# Patient Record
Sex: Female | Born: 1960 | Race: Black or African American | Hispanic: No | Marital: Single | Smoking: Never smoker
Health system: Southern US, Community
[De-identification: ages and names within clinical notes are randomized; demographics above are authoritative.]

## PROBLEM LIST (undated history)

## (undated) DIAGNOSIS — E663 Overweight: Secondary | ICD-10-CM

## (undated) DIAGNOSIS — K219 Gastro-esophageal reflux disease without esophagitis: Secondary | ICD-10-CM

## (undated) DIAGNOSIS — M25569 Pain in unspecified knee: Secondary | ICD-10-CM

## (undated) DIAGNOSIS — IMO0001 Reserved for inherently not codable concepts without codable children: Secondary | ICD-10-CM

## (undated) DIAGNOSIS — E785 Hyperlipidemia, unspecified: Secondary | ICD-10-CM

## (undated) HISTORY — DX: Overweight: E66.3

## (undated) HISTORY — DX: Hyperlipidemia, unspecified: E78.5

## (undated) HISTORY — DX: Gastro-esophageal reflux disease without esophagitis: K21.9

## (undated) HISTORY — DX: Pain in unspecified knee: M25.569

## (undated) HISTORY — DX: Reserved for inherently not codable concepts without codable children: IMO0001

---

## 1998-04-29 ENCOUNTER — Other Ambulatory Visit: Admission: RE | Admit: 1998-04-29 | Discharge: 1998-04-29 | Payer: Self-pay | Admitting: Obstetrics and Gynecology

## 2001-08-01 ENCOUNTER — Encounter: Admission: RE | Admit: 2001-08-01 | Discharge: 2001-08-01 | Payer: Self-pay | Admitting: Family Medicine

## 2001-08-01 ENCOUNTER — Encounter: Payer: Self-pay | Admitting: Family Medicine

## 2004-05-04 ENCOUNTER — Other Ambulatory Visit: Admission: RE | Admit: 2004-05-04 | Discharge: 2004-05-04 | Payer: Self-pay | Admitting: Family Medicine

## 2005-05-12 ENCOUNTER — Other Ambulatory Visit: Admission: RE | Admit: 2005-05-12 | Discharge: 2005-05-12 | Payer: Self-pay | Admitting: Family Medicine

## 2006-12-22 ENCOUNTER — Other Ambulatory Visit: Admission: RE | Admit: 2006-12-22 | Discharge: 2006-12-22 | Payer: Self-pay | Admitting: Family Medicine

## 2012-07-03 ENCOUNTER — Other Ambulatory Visit: Payer: Self-pay | Admitting: Obstetrics and Gynecology

## 2012-07-03 ENCOUNTER — Other Ambulatory Visit (HOSPITAL_COMMUNITY)
Admission: RE | Admit: 2012-07-03 | Discharge: 2012-07-03 | Disposition: A | Discharge status: Home / Self Care | Payer: BC Managed Care – PPO | Source: Ambulatory Visit | Attending: Obstetrics and Gynecology | Admitting: Obstetrics and Gynecology

## 2012-07-03 DIAGNOSIS — Z01419 Encounter for gynecological examination (general) (routine) without abnormal findings: Secondary | ICD-10-CM | POA: Insufficient documentation

## 2012-07-03 DIAGNOSIS — Z1151 Encounter for screening for human papillomavirus (HPV): Secondary | ICD-10-CM | POA: Insufficient documentation

## 2015-08-05 ENCOUNTER — Ambulatory Visit
Admission: RE | Admit: 2015-08-05 | Discharge: 2015-08-05 | Disposition: A | Discharge status: Home / Self Care | Payer: BC Managed Care – PPO | Source: Ambulatory Visit | Attending: Physician Assistant | Admitting: Physician Assistant

## 2015-08-05 ENCOUNTER — Other Ambulatory Visit: Payer: Self-pay | Admitting: Physician Assistant

## 2015-08-05 DIAGNOSIS — M549 Dorsalgia, unspecified: Secondary | ICD-10-CM

## 2015-11-03 ENCOUNTER — Encounter: Payer: Self-pay | Admitting: Cardiology

## 2015-12-29 ENCOUNTER — Other Ambulatory Visit (HOSPITAL_COMMUNITY)
Admission: RE | Admit: 2015-12-29 | Discharge: 2015-12-29 | Disposition: A | Discharge status: Home / Self Care | Payer: BC Managed Care – PPO | Source: Ambulatory Visit | Attending: Obstetrics and Gynecology | Admitting: Obstetrics and Gynecology

## 2015-12-29 ENCOUNTER — Other Ambulatory Visit: Payer: Self-pay | Admitting: Obstetrics and Gynecology

## 2015-12-29 DIAGNOSIS — Z01419 Encounter for gynecological examination (general) (routine) without abnormal findings: Secondary | ICD-10-CM | POA: Diagnosis present

## 2015-12-29 DIAGNOSIS — Z1151 Encounter for screening for human papillomavirus (HPV): Secondary | ICD-10-CM | POA: Diagnosis present

## 2015-12-31 LAB — CYTOLOGY - PAP

## 2016-12-06 ENCOUNTER — Other Ambulatory Visit: Payer: Self-pay | Admitting: Family Medicine

## 2016-12-06 ENCOUNTER — Ambulatory Visit
Admission: RE | Admit: 2016-12-06 | Discharge: 2016-12-06 | Disposition: A | Discharge status: Home / Self Care | Payer: Worker's Compensation | Source: Ambulatory Visit | Attending: Family Medicine | Admitting: Family Medicine

## 2016-12-06 DIAGNOSIS — W19XXXA Unspecified fall, initial encounter: Secondary | ICD-10-CM

## 2019-01-02 ENCOUNTER — Other Ambulatory Visit: Payer: Self-pay | Admitting: Obstetrics and Gynecology

## 2019-01-02 ENCOUNTER — Other Ambulatory Visit (HOSPITAL_COMMUNITY)
Admission: RE | Admit: 2019-01-02 | Discharge: 2019-01-02 | Disposition: A | Discharge status: Home / Self Care | Payer: BC Managed Care – PPO | Source: Ambulatory Visit | Attending: Obstetrics and Gynecology | Admitting: Obstetrics and Gynecology

## 2019-01-02 DIAGNOSIS — Z01419 Encounter for gynecological examination (general) (routine) without abnormal findings: Secondary | ICD-10-CM | POA: Insufficient documentation

## 2019-01-04 LAB — CYTOLOGY - PAP
Diagnosis: NEGATIVE
HPV: NOT DETECTED

## 2019-07-10 ENCOUNTER — Ambulatory Visit: Payer: BC Managed Care – PPO | Attending: Internal Medicine

## 2019-07-10 DIAGNOSIS — Z20822 Contact with and (suspected) exposure to covid-19: Secondary | ICD-10-CM

## 2019-07-12 LAB — NOVEL CORONAVIRUS, NAA: SARS-CoV-2, NAA: NOT DETECTED

## 2019-12-03 NOTE — Discharge Summary (Signed)
 ------------------------------------------------------------------------------- Attestation signed by Breanna Royden Fallow, MD at 12/03/2019 12:37 PM mc -------------------------------------------------------------------------------  Spine Discharge Summary   Admit date: 11/29/2019  Discharge date: 7.19.21  Discharge to: Home  Discharge Service: Orthopedic Spine  Discharge Attending Physician: Royden Riley Gust, MD  Discharge  Diagnosis: L4-S1 Laminectomy and PSF  Secondary Diagnoses: Active Problems:   Spondylolisthesis of lumbar region   Vitamin B12 deficiency   Iron deficiency anemia   OR Procedures: L4-S1 Laminectomy and PSF   Discharge Day Services:  The patient was seen and evaluated by the Ortho Spine team on the day of discharge and deemed stable for discharge, including stable labs, vital signs, radiographic studies, and neurologic exam.  Discharge instructions were given and explained.  Questions were answered.   Physical Examination:  General Appearance:  alert, appears stated age and cooperative Extremities:   extremities normal, atraumatic, no cyanosis or edema Pulses:   2+ and symmetric Skin:   Skin color, texture, turgor normal. No rashes or lesions Neurologic:   Grossly normal  Hospital Course: PT underwent the above procedure without complications. Patient was transferred to PACU in stable condition and then to [address] [place]. Orthopedic spine protocol was followed. Patient received 1unit PRBC on 12/01/19 d/t Hgb of 7.5 with known IDA. Patient became febrile and postop fever vs transfusion reaction workup was completed by hospital medicine. No acute infectious process or transfusion complications were noted. Pain is well controlled with current medications. Will dc home today after up with PT/OT x1. DME rec per PT/OT.    Condition at Discharge: improved  Discharge Medications:   Ayanni, Tun  Home Medication Instructions [id number]   Printed on:12/03/19 1155   Medication Information                    ascorbic acid, vitamin C, (VITAMIN C) 100 MG tablet Take 1 tablet (100 mg total) by mouth daily.           celecoxib (CELEBREX) 200 MG capsule Take 200 mg by mouth daily. **may begin 1 month post op**          cyanocobalamin (VITAMIN B-12) 1000 MCG tablet Take 1 tablet (1,000 mcg total) by mouth daily for 30 days.                     ergocalciferol, vitamin D2, (VITAMIN D ORAL) Take by mouth daily.           esomeprazole (NEXIUM) 40 MG capsule Take 40 mg by mouth daily before breakfast.              ferrous gluconate (FERGON) 324 MG tablet Take 1 tablet (324 mg total) by mouth 2 times daily with meals.           gabapentin (NEURONTIN) 300 MG capsule Take 300 mg by mouth 3 times daily.                     liraglutide, weight loss, (SAXENDA) 3 mg/0.5 mL (18 mg/3 mL) injection Inject 3 mg into the skin daily.           methocarbamoL (ROBAXIN) 500 MG tablet Take 500 mg by mouth 3 (three) times daily as needed.           PROAIR HFA 90 mcg/actuation inhaler Inhale 1 puff into the lungs every 6 (six) hours as needed.              Oxycodone 10mg -acetaminophen 325mg  Take one tablet every 4-6  hours as needed for pain.             [facility] Discharge Instructions - Lumbar Fusion  BRACE You should wear your brace at all times unless otherwise instructed by your surgeon.  You may wear a T-shirt under your brace so that it isn't in contact with your bare skin.  INCISION Please make sure your incisions are checked at least once daily for signs and symptoms of infection: If any of the below should occur, please call us . Drainage from incision site Opening of incision Increased redness and/or tenderness  Fevers greater than 101F  SHOWERING  You may remove your bandages and shower normally 48 hours after surgery.  Hair washing is permissible while in the shower. No soaking in a tub until seen in the office.     EXERCISE Do NOT lift anything weighing greater than about 5 lbs.  (A gallon container of water weighs about 8.5 lbs) Do not bend or twist at the waist.  Always bend your knees.  Try to avoid picking up objects below counter level. You may walk or climb stairs as tolerated.  Walking frequently after surgery will help prevent blood clots. Sitting for over 20-30 minutes at a time can aggravate the pain.  Most patients find sitting in a recliner to be more comfortable.  You can lie down or walk between sitting periods.    PAIN Take the prescribed pain medications only if you are having pain.  Try to minimize the amount taken. Do NOT take any anti-inflammatory medications (ibuprofen, Advil, Aleve, Motrin, etc.) for the first 3 months following your surgery.  These medications can prevent the spinal fusion from healing. To help alleviate persistent soreness, apply ice.  It is normal for this discomfort to persist for several weeks following your surgery.   DRIVING You may NOT drive a car until cleared by your physician.  If you have to take a long trip as a passenger, make sure to stop every 30 minutes so that you can walk around and stretch your legs.  Reclining the passenger seat can make the trip more comfortable.   FOLLOW-UP APPOINTMENTS 4 weeks after surgery.  IF YOU HAVE QUESTIONS or CONCERNS .   call  [phone]   Camie Pickle, PA-C   Electronically signed by: Camie Dolores Pickle, PA-C 12/03/19 1202    Electronically signed by: Max Elsie Schneider, MD 12/03/19 [phone]

## 2019-12-21 NOTE — Discharge Summary (Signed)
 Spine Discharge Summary   Admit date: 12/18/2019  Discharge date: 12/21/2019  Discharge to: Home with home health services  Discharge Service: Orthopedic Spine  Discharge Attending Physician: Royden Elsie Schneider, MD  Discharge  Diagnosis: s/p I&D of incisional wound   Secondary Diagnoses: Active Problems:   S/P lumbar fusion   Wound infection after surgery   Post-operative pain   OR Procedures: s/p I&D of incisional wound   Discharge Day Services:  The patient was seen and evaluated by the Ortho Spine team on the day of discharge and deemed stable for discharge, including stable labs, vital signs, radiographic studies, and neurologic exam.  Discharge instructions were given and explained.  Questions were answered.   Physical Examination:  General Appearance:  alert, appears stated age and cooperative Extremities:   extremities normal, atraumatic, no cyanosis or edema Pulses:   2+ and symmetric Skin:   sutures intact without discharge or signs of infection Neurologic:   Alert and oriented X 3, normal strength and tone. Normal symmetric reflexes. Normal coordination and gait Sensory: normal Motor:grossly normal  Hospital Course: PT underwent the above procedure without complications. Patient was transferred to PACU in stable condition and then to [address] [place]. Orthopedic spine protocol was followed. Pain is well controlled with current medications. Will dc home today after up with PT/OT x1. DME rec per PT/OT.    Condition at Discharge: improved  Discharge Medications:   Breanna Riley, Breanna Riley  Home Medication Instructions [id number]   Printed on:12/21/19 9072  Medication Information                    ascorbic acid, vitamin C, (VITAMIN C) 100 MG tablet Take 1 tablet (100 mg total) by mouth daily.           ciprofloxacin HCl (CIPRO) 500 MG tablet Take 1 tablet (500 mg total) by mouth 2 times daily for 21 days.           cyanocobalamin (VITAMIN B-12) 1000 MCG tablet Take 1 tablet (1,000  mcg total) by mouth daily for 30 days.           ergocalciferol, vitamin D2, (VITAMIN D ORAL) Take 200 Units by mouth daily.              esomeprazole (NEXIUM) 40 MG capsule Take 40 mg by mouth daily before breakfast.              ferrous gluconate (FERGON) 324 MG tablet Take 1 tablet (324 mg total) by mouth 2 times daily with meals.           gabapentin (NEURONTIN) 300 MG capsule Take 300 mg by mouth 3 times daily.           HYDROcodone-acetaminophen (NORCO 10-325) 10-325 mg per tablet Take 1 tablet by mouth every 4 (four) hours as needed for up to 7 days.           methocarbamoL (ROBAXIN) 500 MG tablet Take 500 mg by mouth 3 (three) times daily as needed.            Discharge Instructions - Irrigation and debridement of incisional wound  BRACE You should wear your brace at all times unless otherwise instructed by your surgeon.  You may wear a T-shirt under your brace so that it isn't in contact with your bare skin.  INCISION Change dressing over the incision daily or when bandages are saturated. Recommend using ABD pads and paper tape for bandaging.  Please make sure your incisions  are checked at least once daily for signs and symptoms of infection: If any of the below should occur, please call us . Drainage from incision site Opening of incision Increased redness and/or tenderness  Fevers greater than 101F  SHOWERING You may remove the bandages when showering. Allowing unscented antibacterial soup to run over the incision.  Hair washing is permissible while in the shower. No soaking in a tub until seen in the office.    EXERCISE Do NOT lift anything weighing greater than about 5 lbs.  (A gallon container of water weighs about 8.5 lbs) Do not bend or twist at the waist.  Always bend your knees.  Try to avoid picking up objects below counter level. You may walk or climb stairs as tolerated.  Walking frequently after surgery will help prevent blood clots. Sitting for over  20-30 minutes at a time can aggravate the pain.  Most patients find sitting in a recliner to be more comfortable.  You can lie down or walk between sitting periods.    PAIN Take the prescribed pain medications only if you are having pain.  Try to minimize the amount taken. To help alleviate persistent soreness, apply ice or warm moist compresses.  It is normal for this discomfort to persist for several weeks following your surgery.   DRIVING You may NOT drive a car until cleared by your physician.  If you have to take a long trip as a passenger, make sure to stop every 30 minutes so that you can walk around and stretch your legs.  Reclining the passenger seat can make the trip more comfortable.   FOLLOW-UP APPOINTMENTS  1 week for incision check .  IF YOU HAVE QUESTIONS or CONCERNS .  Call  [phone]   Isaiah Anton, PA-C    Electronically signed by: Isaiah Rumalda Anton, PA-C 12/21/19 0930

## 2020-07-31 ENCOUNTER — Other Ambulatory Visit: Payer: Self-pay | Admitting: Surgery

## 2020-07-31 DIAGNOSIS — M4326 Fusion of spine, lumbar region: Secondary | ICD-10-CM

## 2020-07-31 NOTE — Progress Notes (Signed)
Phone call to patient to verify medication list and allergies for myelogram procedure. Pt instructed to hold Tramadol for 48hrs prior to myelogram appointment time and 24 hours after appointment. Pt also instructed to have a driver the day of the procedure, the procedure would take around 2 hours, and discharge instructions discussed. Pt verbalized understanding.  

## 2020-08-04 ENCOUNTER — Other Ambulatory Visit: Payer: Self-pay

## 2020-08-04 ENCOUNTER — Ambulatory Visit
Admission: RE | Admit: 2020-08-04 | Discharge: 2020-08-04 | Disposition: A | Discharge status: Home / Self Care | Payer: Worker's Compensation | Source: Ambulatory Visit | Attending: Surgery | Admitting: Surgery

## 2020-08-04 DIAGNOSIS — M4326 Fusion of spine, lumbar region: Secondary | ICD-10-CM

## 2020-08-04 MED ORDER — DIAZEPAM 5 MG PO TABS
10.0000 mg | ORAL_TABLET | Freq: Once | ORAL | Status: AC
  Administered 2020-08-04: 10 mg via ORAL

## 2020-08-04 MED ORDER — IOPAMIDOL (ISOVUE-M 200) INJECTION 41%
20.0000 mL | Freq: Once | INTRAMUSCULAR | Status: AC
  Administered 2020-08-04: 20 mL via INTRATHECAL

## 2020-08-04 MED ORDER — MEPERIDINE HCL 50 MG/ML IJ SOLN
50.0000 mg | Freq: Once | INTRAMUSCULAR | Status: AC
  Administered 2020-08-04: 50 mg via INTRAMUSCULAR

## 2020-08-04 MED ORDER — ONDANSETRON HCL 4 MG/2ML IJ SOLN
4.0000 mg | Freq: Once | INTRAMUSCULAR | Status: AC
  Administered 2020-08-04: 4 mg via INTRAMUSCULAR

## 2020-08-04 NOTE — Progress Notes (Signed)
Pt reports she has been off of tramadol for 48 hours and verbalizes understanding not to restart this medication until tomorrow on 08/05/20.

## 2020-08-04 NOTE — Discharge Instr - Other Orders (Addendum)
1020 lower back pain reportedly 10/10  *See MAR 1050 eating crackers and drinking soda without complaints of back pain.

## 2020-08-04 NOTE — Progress Notes (Signed)
Phone call to patient on 07/31/20 to verify medication list and allergies for myelogram procedure. Pt instructed to hold tramadol   for 48hrs prior to myelogram appointment time and 24 hours after appointment. Pt also instructed to have a driver the day of the procedure, the procedure would take around 2 hours, and discharge instructions discussed. Pt verbalized understanding.

## 2020-08-04 NOTE — Discharge Instructions (Signed)
Myelogram Discharge Instructions  1. Go home and rest quietly for the next 24 hours.  It is important to lie flat for the next 24 hours.  Get up only to go to the restroom.  You may lie in the bed or on a couch on your back, your stomach, your left side or your right side.  You may have one pillow under your head.  You may have pillows between your knees while you are on your side or under your knees while you are on your back.  2. DO NOT drive today.  Recline the seat as far back as it will go, while still wearing your seat belt, on the way home.  3. You may get up to go to the bathroom as needed.  You may sit up for 10 minutes to eat.  You may resume your normal diet and medications unless otherwise indicated.  Drink lots of extra fluids today and tomorrow.  4. The incidence of headache, nausea, or vomiting is about 5% (one in 20 patients).  If you develop a headache, lie flat and drink plenty of fluids until the headache goes away.  Caffeinated beverages may be helpful.  If you develop severe nausea and vomiting or a headache that does not go away with flat bed rest, call [phone].  5. You may resume normal activities after your 24 hours of bed rest is over; however, do not exert yourself strongly or do any heavy lifting tomorrow. If when you get up you have a headache when standing, go back to bed and force fluids for another 24 hours.  6. Call your physician for a follow-up appointment.  The results of your myelogram will be sent directly to your physician by the following day.  7. If you have any questions or if complications develop after you arrive home, please call [phone].  Discharge instructions have been explained to the patient.  The patient, or the person responsible for the patient, fully understands these instructions   YOU MAY TAKE YOUR TRAMADOL TOMORROW ON 08/05/20 @ 9:30AM OR THEREAFTER.

## 2022-08-19 IMAGING — XA DG MYELOGRAPHY LUMBAR INJ LUMBOSACRAL
13 of 18 series · 13 of 18 positions shown · non-contrast
Comparison: Lumbar spine x-ray 12/01/2019

CLINICAL DATA: Low back pain extending into the lower extremities
bilaterally, left greater than right. Patient had an official relief
of pain following surgery. Symptoms are now recurrent.
TECHNIQUE: Contiguous axial images were obtained through the Lumbar spine after
the intrathecal infusion of infusion. Coronal and sagittal
reconstructions were obtained of the axial image sets.

[Series 1: w lumbar spine lat · 0.15mm/px · 1 of 1 slices shown]
[im 1/1]
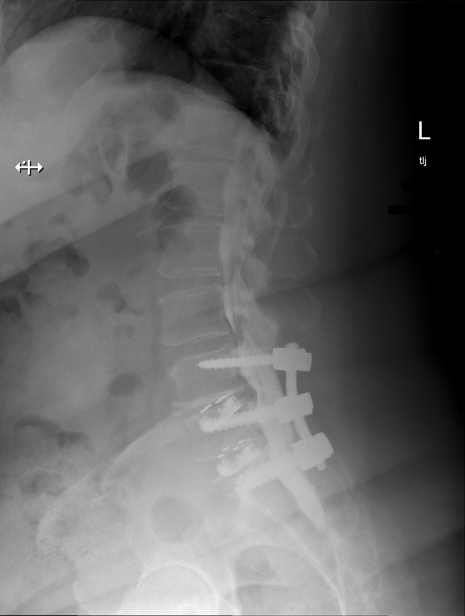

[Series 2: vasc adipose · 1 of 1 slices shown (1 of 10)]
[im 1/1]
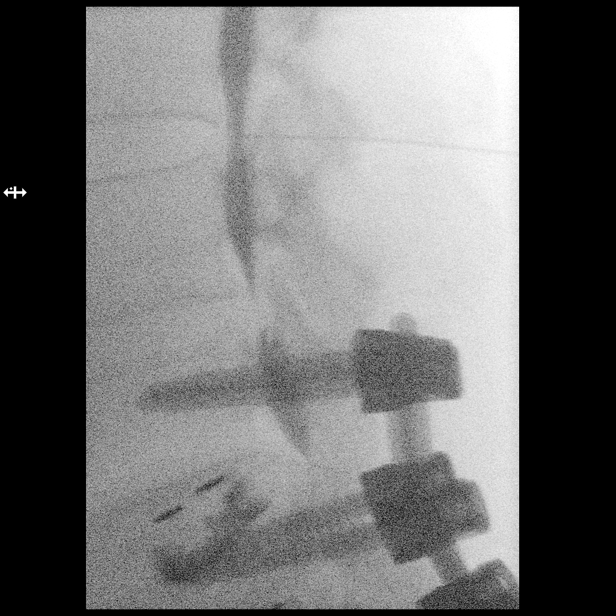

[Series 2: w lumbar spine flexion · 0.15mm/px · 1 of 1 slices shown]
[im 1/1]
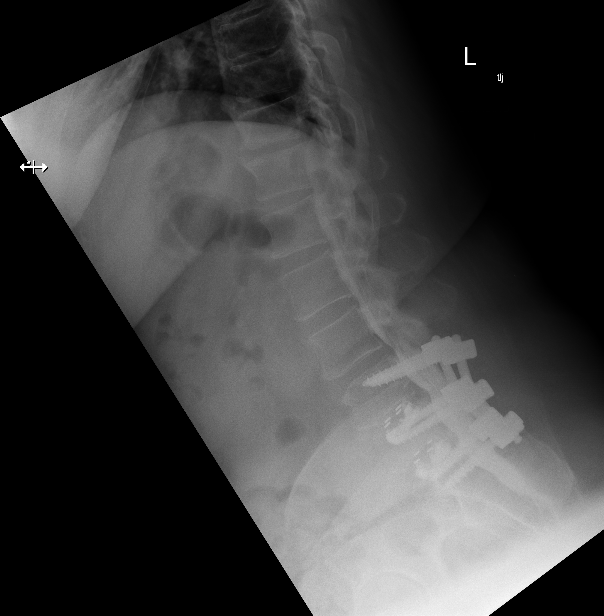

[Series 3: w lumbar spine extension · 0.15mm/px · 1 of 1 slices shown]
[im 1/1]
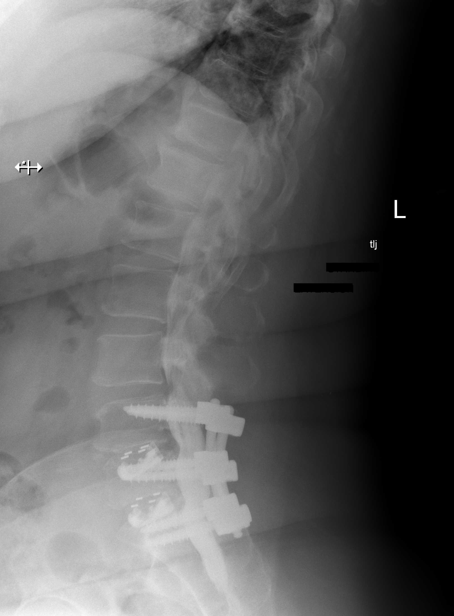

[Series 4: vasc adipose · 1 of 1 slices shown (2 of 10)]
[im 1/1]
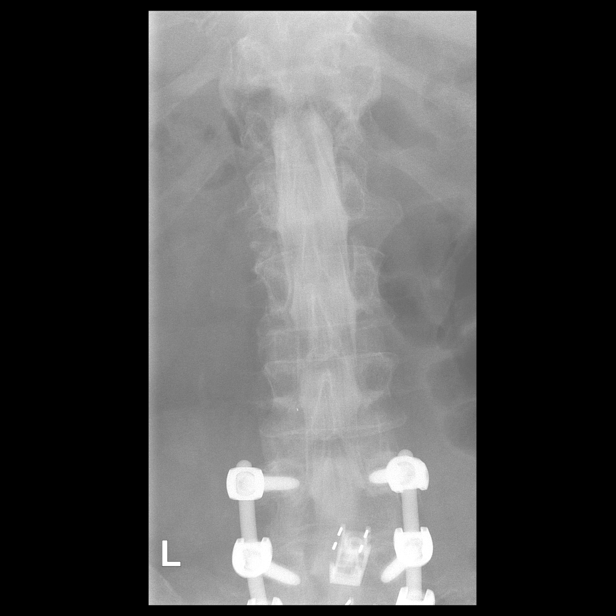

[Series 5: vasc adipose · 1 of 1 slices shown (3 of 10)]
[im 1/1]
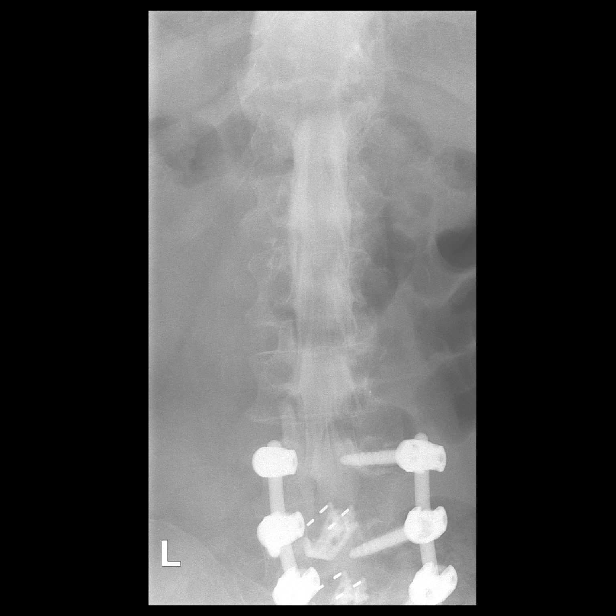

[Series 7: vasc adipose · 1 of 1 slices shown (4 of 10)]
[im 1/1]
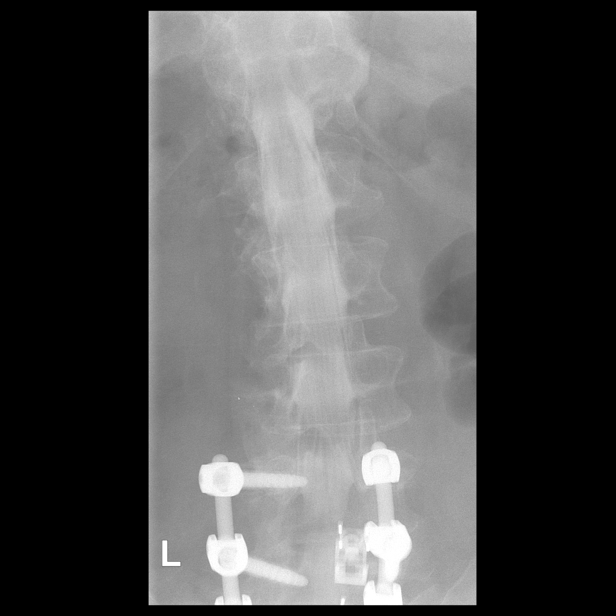

[Series 8: vasc adipose · 1 of 1 slices shown (5 of 10)]
[im 1/1]
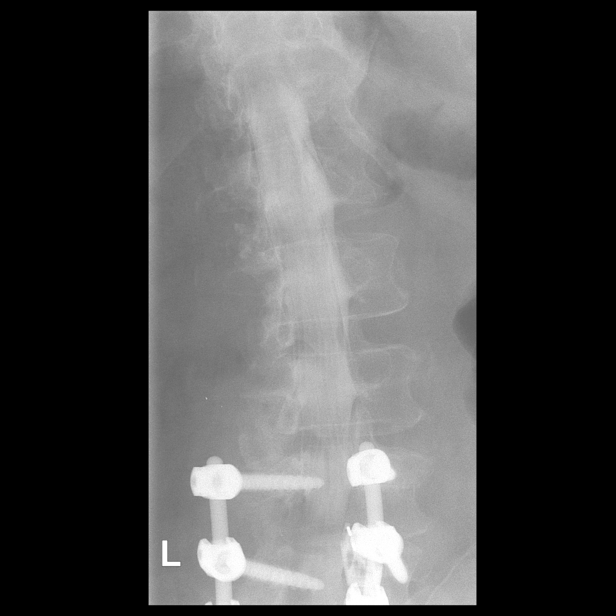

[Series 9: vasc adipose · 1 of 1 slices shown (6 of 10)]
[im 1/1]
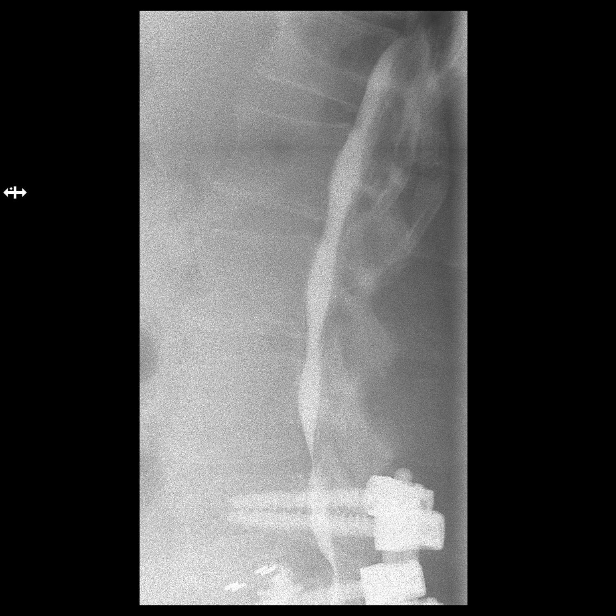

[Series 11: vasc adipose · 1 of 1 slices shown (7 of 10)]
[im 1/1]
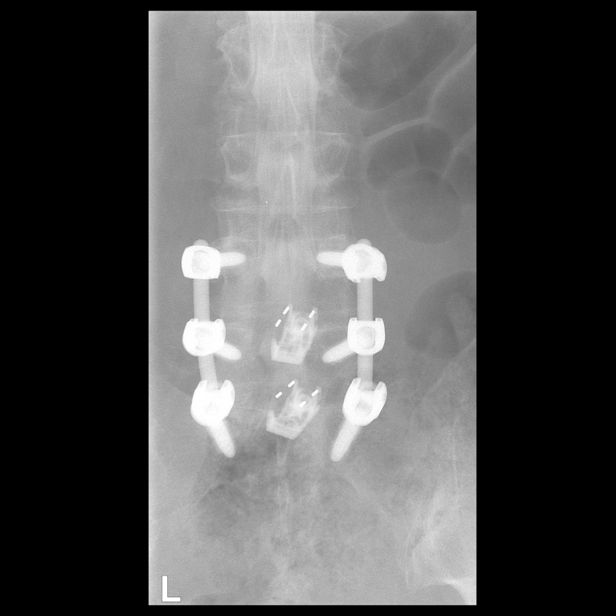

[Series 12: vasc adipose · 1 of 1 slices shown (8 of 10)]
[im 1/1]
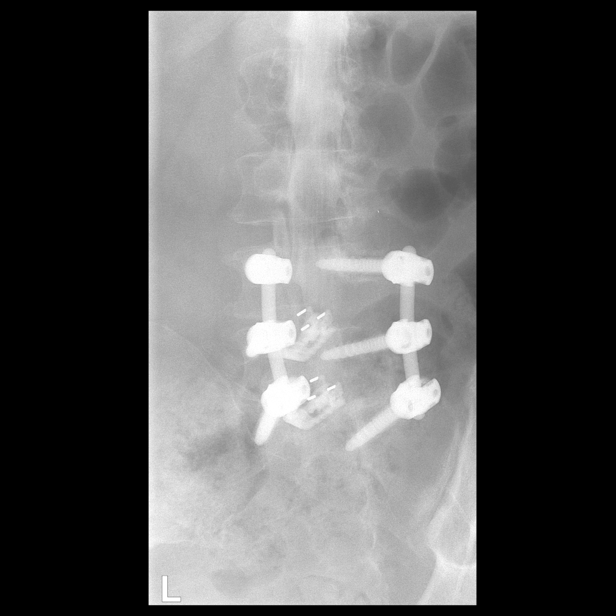

[Series 13: vasc adipose · 1 of 1 slices shown (9 of 10)]
[im 1/1]
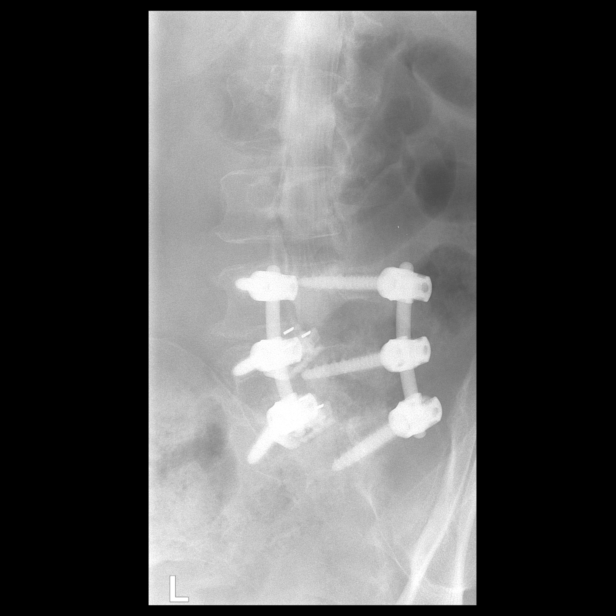

[Series 15: vasc adipose · 1 of 1 slices shown (10 of 10)]
[im 1/1]
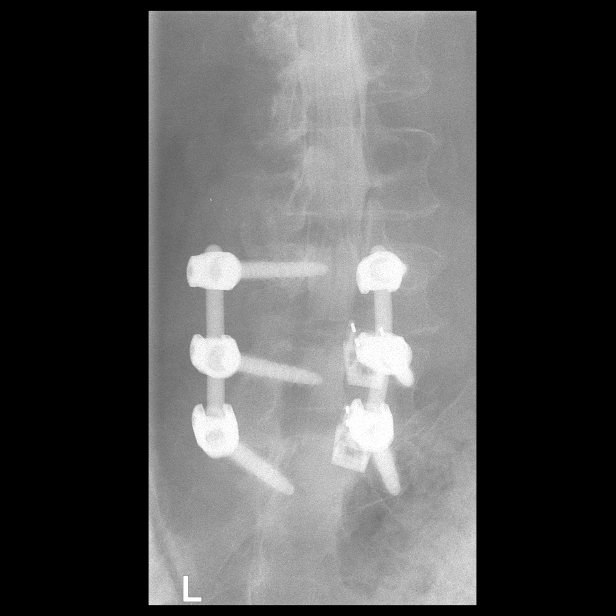

[13 of 18 positions shown; findings below may reference images not displayed]

EXAM:
LUMBAR MYELOGRAM

FLUOROSCOPY TIME:  Radiation Exposure Index (as provided by the
fluoroscopic device): 374.28 uGy*m2

PROCEDURE:
After thorough discussion of risks and benefits of the procedure
including bleeding, infection, injury to nerves, blood vessels,
adjacent structures as well as headache and CSF leak, written and
oral informed consent was obtained. Consent was obtained by Dr.
Botagoz Zh. Time out form was completed.

Patient was positioned prone on the fluoroscopy table. Local
anesthesia was provided with 1% lidocaine without epinephrine after
prepped and draped in the usual sterile fashion. Puncture was
performed at L2-3 using a 3 1/2 inch 22-gauge spinal needle via
right paramedian approach. Using a single pass through the dura, the
needle was placed within the thecal sac, with return of clear CSF.
15 mL of Isovue P-YFF was injected into the thecal sac, with normal
opacification of the nerve roots and cauda equina consistent with
free flow within the subarachnoid space.

I personally performed the lumbar puncture and administered the
intrathecal contrast. I also personally supervised acquisition of
the myelogram images.
FINDINGS: LUMBAR MYELOGRAM FINDINGS:

Five non rib-bearing lumbar type vertebral bodies are present.
Discectomy and fusion noted at L3-4 and L4-5. Grade 1
anterolisthesis is again noted at L3-4. This is exaggerated with
flexion and reduced in extension.

Adjacent level disease is present at L3-4 with a broad-based disc
protrusion. The disc protrusion does not change significantly with
standing. Mild subarticular narrowing is present bilaterally at
L3-4. Residual central canal narrowing is present at L4-5. Lumbar
nerve roots fill normally on both sides.

CT LUMBAR MYELOGRAM FINDINGS:

Non rib-bearing lumbar type vertebral bodies are present no
significant listhesis is present in the supine position. Conus
medullaris terminates at L2, normal. No significant listhesis is
present. Visualized abdominal structures are unremarkable. No
significant adenopathy is present. Paraspinous musculature is within
normal limits. Edematous changes are as expected following surgery.

L1-2: Negative.

L2-3: Facet hypertrophy is present. No significant disc protrusion
or stenosis is present.

L3-4: Mild broad-based disc protrusion is present. Disc extends into
the foramina bilaterally. Moderate facet hypertrophy and ligamentum
flavum thickening is noted. This results in moderate central and
bilateral foraminal narrowing.

L4-5: The patient is status post wide laminectomy. Solid fusion
noted. Is slight ventral impression on the thecal sac without
significant stenosis.

L5-S1: Laminectomy and right foraminotomy noted. Solid fusion
present. No residual or recurrent stenosis is present.
IMPRESSION: 1. Discectomy and fusion at L4-5 and L5-S1 with residual or
recurrent stenosis.
2. Adjacent level disease at L3-4 with moderate central and
bilateral foraminal stenosis secondary to a broad-based disc
protrusion and facet hypertrophy.
3. The disc protrusion central stenosis at L3-4 is worse with
standing. It is exaggerated with flexion and reduced in extension.

## 2022-08-19 IMAGING — CT CT L SPINE W/ CM
1 of 6 series · 6 of 14 positions shown, 8 images · non-contrast
Comparison: Lumbar spine x-ray 12/01/2019

CLINICAL DATA: Low back pain extending into the lower extremities
bilaterally, left greater than right. Patient had an official relief
of pain following surgery. Symptoms are now recurrent.
TECHNIQUE: Contiguous axial images were obtained through the Lumbar spine after
the intrathecal infusion of infusion. Coronal and sagittal
reconstructions were obtained of the axial image sets.

[Series 3: l spine soft (person_name) · axial · 0.32mm/px · z∈[-312,-140]mm · 6 of 122 slices shown, 8 images]
[im 18/122  soft-tissue]
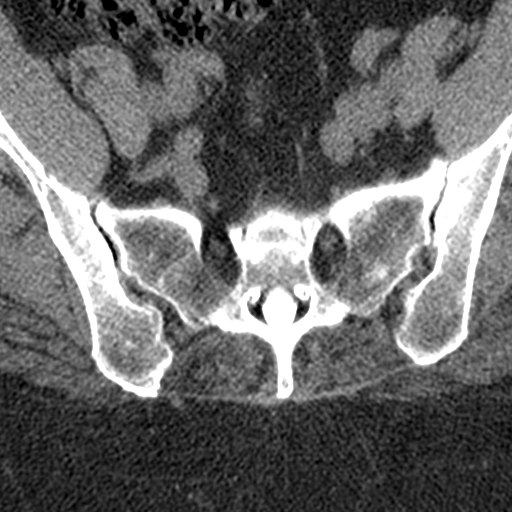
[im 18/122  bone]
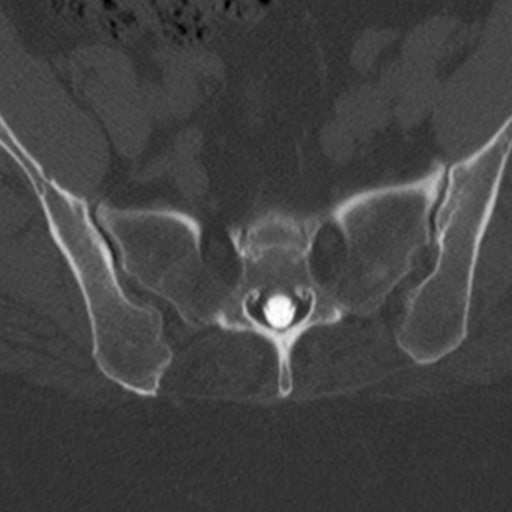
[im 35/122  bone]
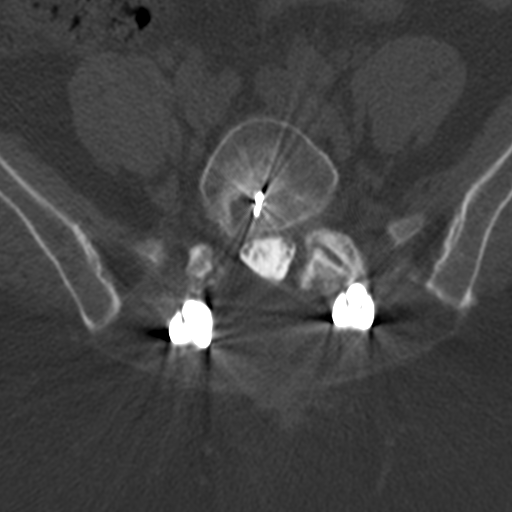
[im 52/122  bone]
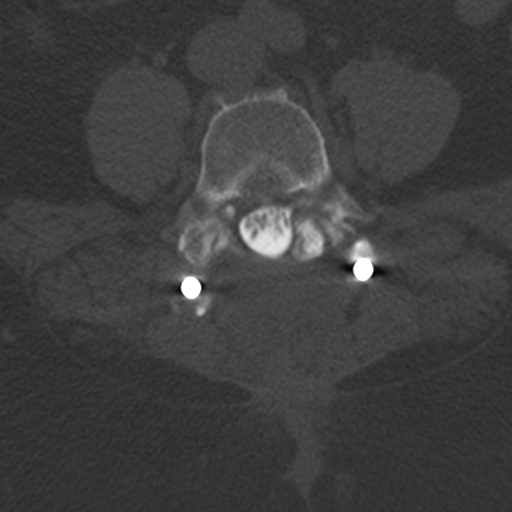
[im 70/122  bone]
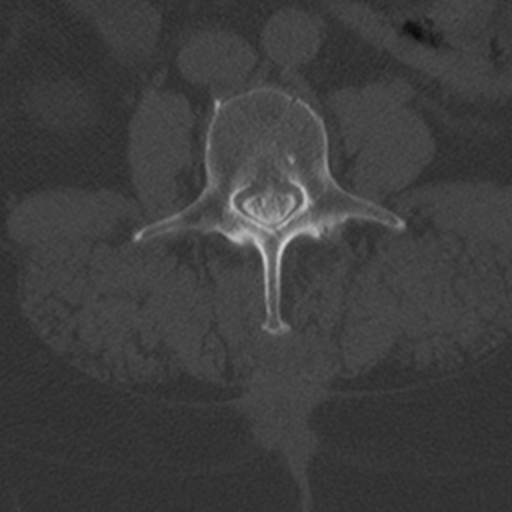
[im 87/122  soft-tissue]
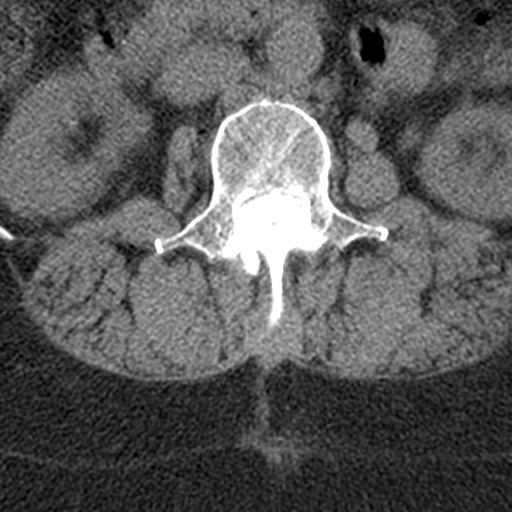
[im 87/122  bone]
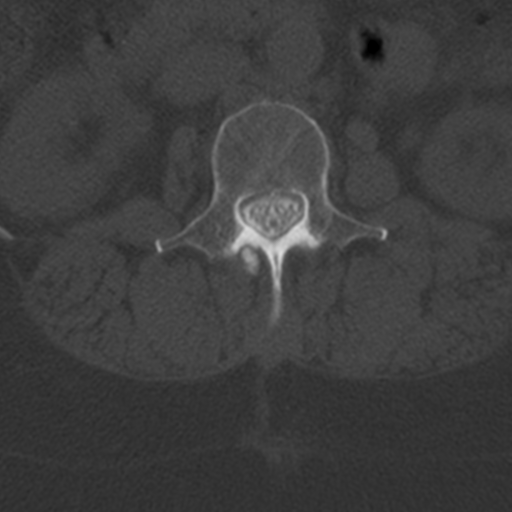
[im 104/122  bone]
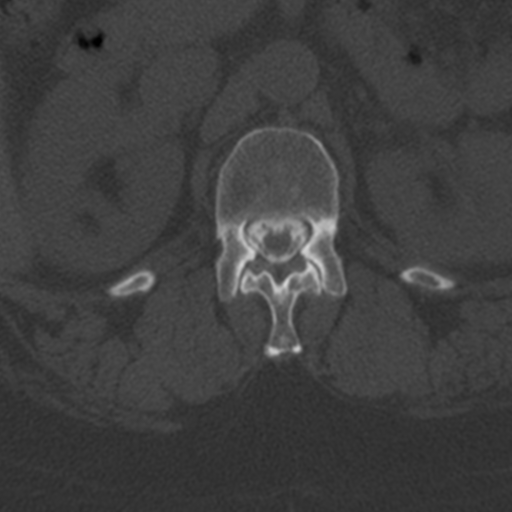

[6 of 14 positions shown; findings below may reference images not displayed]

EXAM:
LUMBAR MYELOGRAM

FLUOROSCOPY TIME:  Radiation Exposure Index (as provided by the
fluoroscopic device): 374.28 uGy*m2

PROCEDURE:
After thorough discussion of risks and benefits of the procedure
including bleeding, infection, injury to nerves, blood vessels,
adjacent structures as well as headache and CSF leak, written and
oral informed consent was obtained. Consent was obtained by Dr.
Botagoz Zh. Time out form was completed.

Patient was positioned prone on the fluoroscopy table. Local
anesthesia was provided with 1% lidocaine without epinephrine after
prepped and draped in the usual sterile fashion. Puncture was
performed at L2-3 using a 3 1/2 inch 22-gauge spinal needle via
right paramedian approach. Using a single pass through the dura, the
needle was placed within the thecal sac, with return of clear CSF.
15 mL of Isovue P-YFF was injected into the thecal sac, with normal
opacification of the nerve roots and cauda equina consistent with
free flow within the subarachnoid space.

I personally performed the lumbar puncture and administered the
intrathecal contrast. I also personally supervised acquisition of
the myelogram images.
FINDINGS: LUMBAR MYELOGRAM FINDINGS:

Five non rib-bearing lumbar type vertebral bodies are present.
Discectomy and fusion noted at L3-4 and L4-5. Grade 1
anterolisthesis is again noted at L3-4. This is exaggerated with
flexion and reduced in extension.

Adjacent level disease is present at L3-4 with a broad-based disc
protrusion. The disc protrusion does not change significantly with
standing. Mild subarticular narrowing is present bilaterally at
L3-4. Residual central canal narrowing is present at L4-5. Lumbar
nerve roots fill normally on both sides.

CT LUMBAR MYELOGRAM FINDINGS:

Non rib-bearing lumbar type vertebral bodies are present no
significant listhesis is present in the supine position. Conus
medullaris terminates at L2, normal. No significant listhesis is
present. Visualized abdominal structures are unremarkable. No
significant adenopathy is present. Paraspinous musculature is within
normal limits. Edematous changes are as expected following surgery.

L1-2: Negative.

L2-3: Facet hypertrophy is present. No significant disc protrusion
or stenosis is present.

L3-4: Mild broad-based disc protrusion is present. Disc extends into
the foramina bilaterally. Moderate facet hypertrophy and ligamentum
flavum thickening is noted. This results in moderate central and
bilateral foraminal narrowing.

L4-5: The patient is status post wide laminectomy. Solid fusion
noted. Is slight ventral impression on the thecal sac without
significant stenosis.

L5-S1: Laminectomy and right foraminotomy noted. Solid fusion
present. No residual or recurrent stenosis is present.
IMPRESSION: 1. Discectomy and fusion at L4-5 and L5-S1 with residual or
recurrent stenosis.
2. Adjacent level disease at L3-4 with moderate central and
bilateral foraminal stenosis secondary to a broad-based disc
protrusion and facet hypertrophy.
3. The disc protrusion central stenosis at L3-4 is worse with
standing. It is exaggerated with flexion and reduced in extension.

## 2022-10-21 ENCOUNTER — Other Ambulatory Visit: Payer: Self-pay

## 2022-10-21 DIAGNOSIS — M479 Spondylosis, unspecified: Secondary | ICD-10-CM

## 2022-10-21 DIAGNOSIS — M5116 Intervertebral disc disorders with radiculopathy, lumbar region: Secondary | ICD-10-CM

## 2022-10-21 DIAGNOSIS — M4716 Other spondylosis with myelopathy, lumbar region: Secondary | ICD-10-CM

## 2022-10-22 ENCOUNTER — Encounter: Payer: Self-pay | Admitting: Orthopaedic Surgery

## 2022-10-22 ENCOUNTER — Other Ambulatory Visit: Payer: Self-pay | Admitting: *Deleted

## 2022-10-22 DIAGNOSIS — M479 Spondylosis, unspecified: Secondary | ICD-10-CM

## 2022-10-22 DIAGNOSIS — M5116 Intervertebral disc disorders with radiculopathy, lumbar region: Secondary | ICD-10-CM

## 2022-10-22 DIAGNOSIS — M4716 Other spondylosis with myelopathy, lumbar region: Secondary | ICD-10-CM

## 2022-10-25 ENCOUNTER — Ambulatory Visit
Admission: RE | Admit: 2022-10-25 | Discharge: 2022-10-25 | Disposition: A | Discharge status: Home / Self Care | Payer: BC Managed Care – PPO | Source: Ambulatory Visit | Attending: *Deleted | Admitting: *Deleted

## 2022-10-25 DIAGNOSIS — M4716 Other spondylosis with myelopathy, lumbar region: Secondary | ICD-10-CM

## 2022-10-25 DIAGNOSIS — M5116 Intervertebral disc disorders with radiculopathy, lumbar region: Secondary | ICD-10-CM

## 2022-10-25 DIAGNOSIS — M479 Spondylosis, unspecified: Secondary | ICD-10-CM

## 2022-10-25 MED ORDER — GADOPICLENOL 0.5 MMOL/ML IV SOLN
10.0000 mL | Freq: Once | INTRAVENOUS | Status: AC | PRN
  Administered 2022-10-25: 10 mL via INTRAVENOUS

## 2022-12-22 NOTE — Discharge Summary (Signed)
 Spine Discharge Summary   Admit date: 12/21/2022  Discharge date: 12/22/2022  Discharge to: Home  Discharge Service: Orthopedic Spine  Discharge Attending Physician: Royden Elsie Schneider, MD  Discharge  Diagnosis: Procedure(s): L3-4 OLIF,  instrumentation, allograft  Secondary Diagnoses: Principal Problem:   Lumbar stenosis with neurogenic claudication Active Problems:   Neurogenic claudication due to lumbar spinal stenosis   OR Procedures: Procedure(s): L3-4 OLIF,  instrumentation, allograft  Discharge Day Services:  The patient was seen and evaluated by the Ortho Spine team on the day of discharge and deemed stable for discharge, including stable labs, vital signs, radiographic studies, and neurologic exam.  Discharge instructions were given and explained.  Questions were answered.  Physical Examination:  Spine Musculoskeletal Exam Motor and neurovascularly intact. Strength 5/5. Negative Homan's Incision clean and healing without significant drainage.  Hospital Course: PT underwent the above procedure without complications. Patient was transferred to PACU in stable condition and then to [facility]. Orthopedic spine protocol was followed. Pain is well controlled with current medications. Will dc home today after up with PT/OT x1. DME rec per PT/OT.    Condition at Discharge: Improved   Discharge Medications:    Medication List     START taking these medications    HYDROcodone-acetaminophen 5-325 mg per tablet Commonly known as: NORCO Take 1 tablet by mouth every 4 (four) hours as needed for severe pain (7-10) for up to 7 days.       CHANGE how you take these medications    meloxicam 15 mg tablet Commonly known as: MOBIC Take 1 tablet (15 mg total) by mouth daily. On hold 7/14 for surgery 12/21/22 per patient Start taking on: March 23, 2023 What changed: These instructions start on March 23, 2023. If you are unsure what to do until then, ask your doctor or other care  provider.       CONTINUE taking these medications    albuterol HFA 90 mcg/actuation inhaler Commonly known as: PROVENTIL HFA;VENTOLIN HFA;PROAIR HFA Inhale 1 puff every 6 (six) hours as needed.   atorvastatin 20 mg tablet Commonly known as: LIPITOR Take 20 mg by mouth daily. Not currently taking.  Starts 12/23/22   cholecalciferol 10 mcg (400 unit) tablet Commonly known as: VITAMIN D3 Take 2,000 Units by mouth daily. On hold 8/2 for surgery 12/21/22   cyanocobalamin 1,000 mcg tablet Commonly known as: VITAMIN B12 Take 1,000 mcg by mouth Once Daily for 30 days.   cyclobenzaprine 10 mg tablet Commonly known as: FLEXERIL Take 10 mg by mouth nightly as needed.   esomeprazole 40 mg DR capsule Commonly known as: NexIUM Take 40 mg by mouth daily before breakfast.   ferrous gluconate 324 mg (38 mg iron) Tab tablet Take 324 mg by mouth 2 (two) times a day with meals for 60 days.   methocarbamoL 500 mg tablet Commonly known as: ROBAXIN Take 500 mg by mouth 3 (three) times a day as needed.   nystatin-triamcinolone 100,000-0.1 unit/g-% cream Commonly known as: MYCOLOG II Apply 1 Application topically daily as needed.   polyethylene glycol 17 gram packet Commonly known as: GLYCOLAX Take 17 g by mouth daily as needed for constipation.   pregabalin 200 mg capsule Commonly known as: LYRICA Take 150 mg by mouth daily as needed.   sertraline 50 mg tablet Commonly known as: ZOLOFT Take 1 tablet by mouth daily.   triamcinolone acetonide 0.5 % cream Commonly known as: KENALOG 1 APPLICATION EXTERNALLY TWICE A DAY 14 DAYS   Vitamin C  100 mg tablet Generic drug: ascorbic acid Take 100 mg by mouth Once Daily.       STOP taking these medications    traMADoL 50 mg tablet Commonly known as: ULTRAM         Where to Get Your Medications     These medications were sent to [id number] - [place], [place] - [address] - PHONE: [phone] - FAX: [phone]  [address] [place] [address]    Phone: [phone]  meloxicam 15 mg tablet    Information about where to get these medications is not yet available   Ask your nurse or doctor about these medications HYDROcodone-acetaminophen 5-325 mg per tablet     [facility] Discharge Instructions - Lumbar Fusion  BRACE You should wear your brace at all times unless otherwise instructed by your surgeon.  You may wear a T-shirt under your brace so that it isn't in contact with your bare skin.  INCISION Please make sure your incisions are checked at least once daily for signs and symptoms of infection: If any of the below should occur, please call us . Drainage from incision site Opening of incision Increased redness and/or tenderness  Fevers greater than 101F  SHOWERING  You may remove your bandages and shower normally 48 hours after surgery.  Hair washing is permissible while in the shower. No soaking in a tub until seen in the office.    EXERCISE Do NOT lift anything weighing greater than about 5 lbs.  (A gallon container of water weighs about 8.5 lbs) Do not bend or twist at the waist.  Always bend your knees.  Try to avoid picking up objects below counter level. You may walk or climb stairs as tolerated.  Walking frequently after surgery will help prevent blood clots. Sitting for over 20-30 minutes at a time can aggravate the pain.  Most patients find sitting in a recliner to be more comfortable.  You can lie down or walk between sitting periods.    PAIN Take the prescribed pain medications only if you are having pain.  Try to minimize the amount taken. Do NOT take any anti-inflammatory medications (ibuprofen, Advil, Aleve, Motrin, etc.) for the first 3 months following your surgery.  These medications can prevent the spinal fusion from healing. To help alleviate persistent soreness, apply ice.  It is normal for this discomfort to persist for several weeks following  your surgery.   DRIVING You may NOT drive a car until cleared by your physician.  If you have to take a long trip as a passenger, make sure to stop every 30 minutes so that you can walk around and stretch your legs.  Reclining the passenger seat can make the trip more comfortable.   FOLLOW-UP APPOINTMENTS 4 weeks after surgery.  IF YOU HAVE QUESTIONS or CONCERNS .   call  [phone]    Katelyn Moffo PA-C

## 2023-05-23 DIAGNOSIS — F4321 Adjustment disorder with depressed mood: Secondary | ICD-10-CM | POA: Diagnosis not present

## 2023-05-26 DIAGNOSIS — M4326 Fusion of spine, lumbar region: Secondary | ICD-10-CM | POA: Diagnosis not present

## 2023-05-26 DIAGNOSIS — M4316 Spondylolisthesis, lumbar region: Secondary | ICD-10-CM | POA: Diagnosis not present

## 2023-05-30 DIAGNOSIS — R2689 Other abnormalities of gait and mobility: Secondary | ICD-10-CM | POA: Diagnosis not present

## 2023-05-30 DIAGNOSIS — M79606 Pain in leg, unspecified: Secondary | ICD-10-CM | POA: Diagnosis not present

## 2023-05-30 DIAGNOSIS — M5459 Other low back pain: Secondary | ICD-10-CM | POA: Diagnosis not present

## 2023-05-30 DIAGNOSIS — Z4789 Encounter for other orthopedic aftercare: Secondary | ICD-10-CM | POA: Diagnosis not present

## 2023-05-30 DIAGNOSIS — M6281 Muscle weakness (generalized): Secondary | ICD-10-CM | POA: Diagnosis not present

## 2023-06-01 DIAGNOSIS — M5459 Other low back pain: Secondary | ICD-10-CM | POA: Diagnosis not present

## 2023-06-01 DIAGNOSIS — Z4789 Encounter for other orthopedic aftercare: Secondary | ICD-10-CM | POA: Diagnosis not present

## 2023-06-01 DIAGNOSIS — M6281 Muscle weakness (generalized): Secondary | ICD-10-CM | POA: Diagnosis not present

## 2023-06-01 DIAGNOSIS — M79606 Pain in leg, unspecified: Secondary | ICD-10-CM | POA: Diagnosis not present

## 2023-06-01 DIAGNOSIS — R2689 Other abnormalities of gait and mobility: Secondary | ICD-10-CM | POA: Diagnosis not present

## 2023-06-06 DIAGNOSIS — Z4789 Encounter for other orthopedic aftercare: Secondary | ICD-10-CM | POA: Diagnosis not present

## 2023-06-06 DIAGNOSIS — M5459 Other low back pain: Secondary | ICD-10-CM | POA: Diagnosis not present

## 2023-06-06 DIAGNOSIS — M6281 Muscle weakness (generalized): Secondary | ICD-10-CM | POA: Diagnosis not present

## 2023-06-06 DIAGNOSIS — R2689 Other abnormalities of gait and mobility: Secondary | ICD-10-CM | POA: Diagnosis not present

## 2023-06-06 DIAGNOSIS — M79606 Pain in leg, unspecified: Secondary | ICD-10-CM | POA: Diagnosis not present

## 2023-06-08 DIAGNOSIS — M6281 Muscle weakness (generalized): Secondary | ICD-10-CM | POA: Diagnosis not present

## 2023-06-08 DIAGNOSIS — F4321 Adjustment disorder with depressed mood: Secondary | ICD-10-CM | POA: Diagnosis not present

## 2023-06-08 DIAGNOSIS — M79606 Pain in leg, unspecified: Secondary | ICD-10-CM | POA: Diagnosis not present

## 2023-06-08 DIAGNOSIS — Z4789 Encounter for other orthopedic aftercare: Secondary | ICD-10-CM | POA: Diagnosis not present

## 2023-06-08 DIAGNOSIS — R2689 Other abnormalities of gait and mobility: Secondary | ICD-10-CM | POA: Diagnosis not present

## 2023-06-08 DIAGNOSIS — M5459 Other low back pain: Secondary | ICD-10-CM | POA: Diagnosis not present

## 2023-06-14 DIAGNOSIS — M170 Bilateral primary osteoarthritis of knee: Secondary | ICD-10-CM | POA: Diagnosis not present

## 2023-06-17 DIAGNOSIS — M6281 Muscle weakness (generalized): Secondary | ICD-10-CM | POA: Diagnosis not present

## 2023-06-17 DIAGNOSIS — Z4789 Encounter for other orthopedic aftercare: Secondary | ICD-10-CM | POA: Diagnosis not present

## 2023-06-17 DIAGNOSIS — R2689 Other abnormalities of gait and mobility: Secondary | ICD-10-CM | POA: Diagnosis not present

## 2023-06-17 DIAGNOSIS — M5459 Other low back pain: Secondary | ICD-10-CM | POA: Diagnosis not present

## 2023-06-17 DIAGNOSIS — M79606 Pain in leg, unspecified: Secondary | ICD-10-CM | POA: Diagnosis not present

## 2023-06-20 DIAGNOSIS — M6281 Muscle weakness (generalized): Secondary | ICD-10-CM | POA: Diagnosis not present

## 2023-06-20 DIAGNOSIS — M79606 Pain in leg, unspecified: Secondary | ICD-10-CM | POA: Diagnosis not present

## 2023-06-20 DIAGNOSIS — R2689 Other abnormalities of gait and mobility: Secondary | ICD-10-CM | POA: Diagnosis not present

## 2023-06-20 DIAGNOSIS — M5459 Other low back pain: Secondary | ICD-10-CM | POA: Diagnosis not present

## 2023-06-20 DIAGNOSIS — Z4789 Encounter for other orthopedic aftercare: Secondary | ICD-10-CM | POA: Diagnosis not present

## 2023-06-21 DIAGNOSIS — G4733 Obstructive sleep apnea (adult) (pediatric): Secondary | ICD-10-CM | POA: Diagnosis not present

## 2023-06-22 DIAGNOSIS — M79606 Pain in leg, unspecified: Secondary | ICD-10-CM | POA: Diagnosis not present

## 2023-06-22 DIAGNOSIS — R2689 Other abnormalities of gait and mobility: Secondary | ICD-10-CM | POA: Diagnosis not present

## 2023-06-22 DIAGNOSIS — Z4789 Encounter for other orthopedic aftercare: Secondary | ICD-10-CM | POA: Diagnosis not present

## 2023-06-22 DIAGNOSIS — M5459 Other low back pain: Secondary | ICD-10-CM | POA: Diagnosis not present

## 2023-06-22 DIAGNOSIS — M6281 Muscle weakness (generalized): Secondary | ICD-10-CM | POA: Diagnosis not present

## 2023-06-23 DIAGNOSIS — F4321 Adjustment disorder with depressed mood: Secondary | ICD-10-CM | POA: Diagnosis not present

## 2023-06-27 DIAGNOSIS — Z4789 Encounter for other orthopedic aftercare: Secondary | ICD-10-CM | POA: Diagnosis not present

## 2023-06-27 DIAGNOSIS — M5459 Other low back pain: Secondary | ICD-10-CM | POA: Diagnosis not present

## 2023-06-27 DIAGNOSIS — R2689 Other abnormalities of gait and mobility: Secondary | ICD-10-CM | POA: Diagnosis not present

## 2023-06-27 DIAGNOSIS — M79606 Pain in leg, unspecified: Secondary | ICD-10-CM | POA: Diagnosis not present

## 2023-06-27 DIAGNOSIS — M6281 Muscle weakness (generalized): Secondary | ICD-10-CM | POA: Diagnosis not present

## 2023-06-29 DIAGNOSIS — Z4789 Encounter for other orthopedic aftercare: Secondary | ICD-10-CM | POA: Diagnosis not present

## 2023-06-29 DIAGNOSIS — M5459 Other low back pain: Secondary | ICD-10-CM | POA: Diagnosis not present

## 2023-06-29 DIAGNOSIS — M6281 Muscle weakness (generalized): Secondary | ICD-10-CM | POA: Diagnosis not present

## 2023-06-29 DIAGNOSIS — M79606 Pain in leg, unspecified: Secondary | ICD-10-CM | POA: Diagnosis not present

## 2023-06-29 DIAGNOSIS — R2689 Other abnormalities of gait and mobility: Secondary | ICD-10-CM | POA: Diagnosis not present

## 2023-07-04 DIAGNOSIS — Z4789 Encounter for other orthopedic aftercare: Secondary | ICD-10-CM | POA: Diagnosis not present

## 2023-07-04 DIAGNOSIS — M6281 Muscle weakness (generalized): Secondary | ICD-10-CM | POA: Diagnosis not present

## 2023-07-04 DIAGNOSIS — M79606 Pain in leg, unspecified: Secondary | ICD-10-CM | POA: Diagnosis not present

## 2023-07-04 DIAGNOSIS — R2689 Other abnormalities of gait and mobility: Secondary | ICD-10-CM | POA: Diagnosis not present

## 2023-07-04 DIAGNOSIS — M5459 Other low back pain: Secondary | ICD-10-CM | POA: Diagnosis not present

## 2023-07-05 DIAGNOSIS — F4321 Adjustment disorder with depressed mood: Secondary | ICD-10-CM | POA: Diagnosis not present

## 2023-07-06 DIAGNOSIS — M79606 Pain in leg, unspecified: Secondary | ICD-10-CM | POA: Diagnosis not present

## 2023-07-06 DIAGNOSIS — Z4789 Encounter for other orthopedic aftercare: Secondary | ICD-10-CM | POA: Diagnosis not present

## 2023-07-06 DIAGNOSIS — R2689 Other abnormalities of gait and mobility: Secondary | ICD-10-CM | POA: Diagnosis not present

## 2023-07-06 DIAGNOSIS — M6281 Muscle weakness (generalized): Secondary | ICD-10-CM | POA: Diagnosis not present

## 2023-07-06 DIAGNOSIS — M5459 Other low back pain: Secondary | ICD-10-CM | POA: Diagnosis not present

## 2023-07-11 DIAGNOSIS — M6281 Muscle weakness (generalized): Secondary | ICD-10-CM | POA: Diagnosis not present

## 2023-07-11 DIAGNOSIS — M79606 Pain in leg, unspecified: Secondary | ICD-10-CM | POA: Diagnosis not present

## 2023-07-11 DIAGNOSIS — R2689 Other abnormalities of gait and mobility: Secondary | ICD-10-CM | POA: Diagnosis not present

## 2023-07-11 DIAGNOSIS — M5459 Other low back pain: Secondary | ICD-10-CM | POA: Diagnosis not present

## 2023-07-11 DIAGNOSIS — Z4789 Encounter for other orthopedic aftercare: Secondary | ICD-10-CM | POA: Diagnosis not present

## 2023-07-16 DIAGNOSIS — J209 Acute bronchitis, unspecified: Secondary | ICD-10-CM | POA: Diagnosis not present

## 2023-07-19 DIAGNOSIS — F4321 Adjustment disorder with depressed mood: Secondary | ICD-10-CM | POA: Diagnosis not present

## 2023-07-21 DIAGNOSIS — Z6837 Body mass index (BMI) 37.0-37.9, adult: Secondary | ICD-10-CM | POA: Diagnosis not present

## 2023-07-21 DIAGNOSIS — M4316 Spondylolisthesis, lumbar region: Secondary | ICD-10-CM | POA: Diagnosis not present

## 2023-07-21 DIAGNOSIS — M4326 Fusion of spine, lumbar region: Secondary | ICD-10-CM | POA: Diagnosis not present

## 2023-07-26 DIAGNOSIS — J189 Pneumonia, unspecified organism: Secondary | ICD-10-CM | POA: Diagnosis not present

## 2023-07-26 DIAGNOSIS — R0602 Shortness of breath: Secondary | ICD-10-CM | POA: Diagnosis not present

## 2023-08-02 DIAGNOSIS — F4321 Adjustment disorder with depressed mood: Secondary | ICD-10-CM | POA: Diagnosis not present

## 2023-08-03 DIAGNOSIS — R2689 Other abnormalities of gait and mobility: Secondary | ICD-10-CM | POA: Diagnosis not present

## 2023-08-03 DIAGNOSIS — M79606 Pain in leg, unspecified: Secondary | ICD-10-CM | POA: Diagnosis not present

## 2023-08-03 DIAGNOSIS — M5459 Other low back pain: Secondary | ICD-10-CM | POA: Diagnosis not present

## 2023-08-03 DIAGNOSIS — Z4789 Encounter for other orthopedic aftercare: Secondary | ICD-10-CM | POA: Diagnosis not present

## 2023-08-03 DIAGNOSIS — M6281 Muscle weakness (generalized): Secondary | ICD-10-CM | POA: Diagnosis not present

## 2023-08-05 DIAGNOSIS — M6281 Muscle weakness (generalized): Secondary | ICD-10-CM | POA: Diagnosis not present

## 2023-08-05 DIAGNOSIS — R2689 Other abnormalities of gait and mobility: Secondary | ICD-10-CM | POA: Diagnosis not present

## 2023-08-05 DIAGNOSIS — M79606 Pain in leg, unspecified: Secondary | ICD-10-CM | POA: Diagnosis not present

## 2023-08-05 DIAGNOSIS — Z4789 Encounter for other orthopedic aftercare: Secondary | ICD-10-CM | POA: Diagnosis not present

## 2023-08-05 DIAGNOSIS — M5459 Other low back pain: Secondary | ICD-10-CM | POA: Diagnosis not present

## 2023-08-08 DIAGNOSIS — M5459 Other low back pain: Secondary | ICD-10-CM | POA: Diagnosis not present

## 2023-08-08 DIAGNOSIS — M79606 Pain in leg, unspecified: Secondary | ICD-10-CM | POA: Diagnosis not present

## 2023-08-08 DIAGNOSIS — Z4789 Encounter for other orthopedic aftercare: Secondary | ICD-10-CM | POA: Diagnosis not present

## 2023-08-08 DIAGNOSIS — R2689 Other abnormalities of gait and mobility: Secondary | ICD-10-CM | POA: Diagnosis not present

## 2023-08-08 DIAGNOSIS — M6281 Muscle weakness (generalized): Secondary | ICD-10-CM | POA: Diagnosis not present

## 2023-08-10 DIAGNOSIS — M6281 Muscle weakness (generalized): Secondary | ICD-10-CM | POA: Diagnosis not present

## 2023-08-10 DIAGNOSIS — M5459 Other low back pain: Secondary | ICD-10-CM | POA: Diagnosis not present

## 2023-08-10 DIAGNOSIS — Z4789 Encounter for other orthopedic aftercare: Secondary | ICD-10-CM | POA: Diagnosis not present

## 2023-08-10 DIAGNOSIS — R2689 Other abnormalities of gait and mobility: Secondary | ICD-10-CM | POA: Diagnosis not present

## 2023-08-10 DIAGNOSIS — M79606 Pain in leg, unspecified: Secondary | ICD-10-CM | POA: Diagnosis not present

## 2023-08-15 DIAGNOSIS — M5459 Other low back pain: Secondary | ICD-10-CM | POA: Diagnosis not present

## 2023-08-15 DIAGNOSIS — M79606 Pain in leg, unspecified: Secondary | ICD-10-CM | POA: Diagnosis not present

## 2023-08-15 DIAGNOSIS — Z4789 Encounter for other orthopedic aftercare: Secondary | ICD-10-CM | POA: Diagnosis not present

## 2023-08-15 DIAGNOSIS — R2689 Other abnormalities of gait and mobility: Secondary | ICD-10-CM | POA: Diagnosis not present

## 2023-08-15 DIAGNOSIS — M6281 Muscle weakness (generalized): Secondary | ICD-10-CM | POA: Diagnosis not present

## 2023-08-17 DIAGNOSIS — M6281 Muscle weakness (generalized): Secondary | ICD-10-CM | POA: Diagnosis not present

## 2023-08-17 DIAGNOSIS — R2689 Other abnormalities of gait and mobility: Secondary | ICD-10-CM | POA: Diagnosis not present

## 2023-08-17 DIAGNOSIS — M5459 Other low back pain: Secondary | ICD-10-CM | POA: Diagnosis not present

## 2023-08-17 DIAGNOSIS — F4321 Adjustment disorder with depressed mood: Secondary | ICD-10-CM | POA: Diagnosis not present

## 2023-08-17 DIAGNOSIS — M79606 Pain in leg, unspecified: Secondary | ICD-10-CM | POA: Diagnosis not present

## 2023-08-17 DIAGNOSIS — Z4789 Encounter for other orthopedic aftercare: Secondary | ICD-10-CM | POA: Diagnosis not present

## 2023-08-24 DIAGNOSIS — R2689 Other abnormalities of gait and mobility: Secondary | ICD-10-CM | POA: Diagnosis not present

## 2023-08-24 DIAGNOSIS — Z4789 Encounter for other orthopedic aftercare: Secondary | ICD-10-CM | POA: Diagnosis not present

## 2023-08-24 DIAGNOSIS — M79606 Pain in leg, unspecified: Secondary | ICD-10-CM | POA: Diagnosis not present

## 2023-08-24 DIAGNOSIS — M6281 Muscle weakness (generalized): Secondary | ICD-10-CM | POA: Diagnosis not present

## 2023-08-24 DIAGNOSIS — M5459 Other low back pain: Secondary | ICD-10-CM | POA: Diagnosis not present

## 2023-08-26 DIAGNOSIS — M79606 Pain in leg, unspecified: Secondary | ICD-10-CM | POA: Diagnosis not present

## 2023-08-26 DIAGNOSIS — Z4789 Encounter for other orthopedic aftercare: Secondary | ICD-10-CM | POA: Diagnosis not present

## 2023-08-26 DIAGNOSIS — R2689 Other abnormalities of gait and mobility: Secondary | ICD-10-CM | POA: Diagnosis not present

## 2023-08-26 DIAGNOSIS — M5459 Other low back pain: Secondary | ICD-10-CM | POA: Diagnosis not present

## 2023-08-26 DIAGNOSIS — M6281 Muscle weakness (generalized): Secondary | ICD-10-CM | POA: Diagnosis not present

## 2023-08-29 DIAGNOSIS — M79606 Pain in leg, unspecified: Secondary | ICD-10-CM | POA: Diagnosis not present

## 2023-08-29 DIAGNOSIS — R2689 Other abnormalities of gait and mobility: Secondary | ICD-10-CM | POA: Diagnosis not present

## 2023-08-29 DIAGNOSIS — M5459 Other low back pain: Secondary | ICD-10-CM | POA: Diagnosis not present

## 2023-08-29 DIAGNOSIS — M6281 Muscle weakness (generalized): Secondary | ICD-10-CM | POA: Diagnosis not present

## 2023-08-29 DIAGNOSIS — Z4789 Encounter for other orthopedic aftercare: Secondary | ICD-10-CM | POA: Diagnosis not present

## 2023-08-31 DIAGNOSIS — R2689 Other abnormalities of gait and mobility: Secondary | ICD-10-CM | POA: Diagnosis not present

## 2023-08-31 DIAGNOSIS — M5459 Other low back pain: Secondary | ICD-10-CM | POA: Diagnosis not present

## 2023-08-31 DIAGNOSIS — M79606 Pain in leg, unspecified: Secondary | ICD-10-CM | POA: Diagnosis not present

## 2023-08-31 DIAGNOSIS — M6281 Muscle weakness (generalized): Secondary | ICD-10-CM | POA: Diagnosis not present

## 2023-08-31 DIAGNOSIS — F4321 Adjustment disorder with depressed mood: Secondary | ICD-10-CM | POA: Diagnosis not present

## 2023-08-31 DIAGNOSIS — Z4789 Encounter for other orthopedic aftercare: Secondary | ICD-10-CM | POA: Diagnosis not present

## 2023-09-07 DIAGNOSIS — M6281 Muscle weakness (generalized): Secondary | ICD-10-CM | POA: Diagnosis not present

## 2023-09-07 DIAGNOSIS — M79606 Pain in leg, unspecified: Secondary | ICD-10-CM | POA: Diagnosis not present

## 2023-09-07 DIAGNOSIS — R2689 Other abnormalities of gait and mobility: Secondary | ICD-10-CM | POA: Diagnosis not present

## 2023-09-07 DIAGNOSIS — Z4789 Encounter for other orthopedic aftercare: Secondary | ICD-10-CM | POA: Diagnosis not present

## 2023-09-07 DIAGNOSIS — M5459 Other low back pain: Secondary | ICD-10-CM | POA: Diagnosis not present

## 2023-09-09 DIAGNOSIS — R2689 Other abnormalities of gait and mobility: Secondary | ICD-10-CM | POA: Diagnosis not present

## 2023-09-09 DIAGNOSIS — M6281 Muscle weakness (generalized): Secondary | ICD-10-CM | POA: Diagnosis not present

## 2023-09-09 DIAGNOSIS — M79606 Pain in leg, unspecified: Secondary | ICD-10-CM | POA: Diagnosis not present

## 2023-09-09 DIAGNOSIS — M5459 Other low back pain: Secondary | ICD-10-CM | POA: Diagnosis not present

## 2023-09-09 DIAGNOSIS — Z4789 Encounter for other orthopedic aftercare: Secondary | ICD-10-CM | POA: Diagnosis not present

## 2023-09-12 DIAGNOSIS — Z4789 Encounter for other orthopedic aftercare: Secondary | ICD-10-CM | POA: Diagnosis not present

## 2023-09-12 DIAGNOSIS — R2689 Other abnormalities of gait and mobility: Secondary | ICD-10-CM | POA: Diagnosis not present

## 2023-09-12 DIAGNOSIS — M5459 Other low back pain: Secondary | ICD-10-CM | POA: Diagnosis not present

## 2023-09-12 DIAGNOSIS — M6281 Muscle weakness (generalized): Secondary | ICD-10-CM | POA: Diagnosis not present

## 2023-09-12 DIAGNOSIS — M79606 Pain in leg, unspecified: Secondary | ICD-10-CM | POA: Diagnosis not present

## 2023-09-14 DIAGNOSIS — R2689 Other abnormalities of gait and mobility: Secondary | ICD-10-CM | POA: Diagnosis not present

## 2023-09-14 DIAGNOSIS — Z4789 Encounter for other orthopedic aftercare: Secondary | ICD-10-CM | POA: Diagnosis not present

## 2023-09-14 DIAGNOSIS — F4321 Adjustment disorder with depressed mood: Secondary | ICD-10-CM | POA: Diagnosis not present

## 2023-09-14 DIAGNOSIS — M5459 Other low back pain: Secondary | ICD-10-CM | POA: Diagnosis not present

## 2023-09-14 DIAGNOSIS — M6281 Muscle weakness (generalized): Secondary | ICD-10-CM | POA: Diagnosis not present

## 2023-09-14 DIAGNOSIS — M79606 Pain in leg, unspecified: Secondary | ICD-10-CM | POA: Diagnosis not present

## 2023-09-16 DIAGNOSIS — G4733 Obstructive sleep apnea (adult) (pediatric): Secondary | ICD-10-CM | POA: Diagnosis not present

## 2023-09-16 DIAGNOSIS — Z6841 Body Mass Index (BMI) 40.0 and over, adult: Secondary | ICD-10-CM | POA: Diagnosis not present

## 2023-09-19 DIAGNOSIS — Z1330 Encounter for screening examination for mental health and behavioral disorders, unspecified: Secondary | ICD-10-CM | POA: Diagnosis not present

## 2023-09-19 DIAGNOSIS — M7061 Trochanteric bursitis, right hip: Secondary | ICD-10-CM | POA: Diagnosis not present

## 2023-09-19 DIAGNOSIS — M7062 Trochanteric bursitis, left hip: Secondary | ICD-10-CM | POA: Diagnosis not present

## 2023-09-19 DIAGNOSIS — Z981 Arthrodesis status: Secondary | ICD-10-CM | POA: Diagnosis not present

## 2023-09-21 DIAGNOSIS — M6281 Muscle weakness (generalized): Secondary | ICD-10-CM | POA: Diagnosis not present

## 2023-09-21 DIAGNOSIS — Z4789 Encounter for other orthopedic aftercare: Secondary | ICD-10-CM | POA: Diagnosis not present

## 2023-09-21 DIAGNOSIS — M79606 Pain in leg, unspecified: Secondary | ICD-10-CM | POA: Diagnosis not present

## 2023-09-21 DIAGNOSIS — R2689 Other abnormalities of gait and mobility: Secondary | ICD-10-CM | POA: Diagnosis not present

## 2023-09-21 DIAGNOSIS — M5459 Other low back pain: Secondary | ICD-10-CM | POA: Diagnosis not present

## 2023-09-28 DIAGNOSIS — Z23 Encounter for immunization: Secondary | ICD-10-CM | POA: Diagnosis not present

## 2023-09-28 DIAGNOSIS — G4733 Obstructive sleep apnea (adult) (pediatric): Secondary | ICD-10-CM | POA: Diagnosis not present

## 2023-09-28 DIAGNOSIS — G8929 Other chronic pain: Secondary | ICD-10-CM | POA: Diagnosis not present

## 2023-09-28 DIAGNOSIS — F4321 Adjustment disorder with depressed mood: Secondary | ICD-10-CM | POA: Diagnosis not present

## 2023-09-28 DIAGNOSIS — Z6841 Body Mass Index (BMI) 40.0 and over, adult: Secondary | ICD-10-CM | POA: Diagnosis not present

## 2023-09-28 DIAGNOSIS — E785 Hyperlipidemia, unspecified: Secondary | ICD-10-CM | POA: Diagnosis not present

## 2023-09-28 DIAGNOSIS — Z0000 Encounter for general adult medical examination without abnormal findings: Secondary | ICD-10-CM | POA: Diagnosis not present

## 2023-09-28 DIAGNOSIS — M545 Low back pain, unspecified: Secondary | ICD-10-CM | POA: Diagnosis not present

## 2023-09-28 DIAGNOSIS — K219 Gastro-esophageal reflux disease without esophagitis: Secondary | ICD-10-CM | POA: Diagnosis not present

## 2023-09-28 DIAGNOSIS — F339 Major depressive disorder, recurrent, unspecified: Secondary | ICD-10-CM | POA: Diagnosis not present

## 2023-09-29 DIAGNOSIS — R2689 Other abnormalities of gait and mobility: Secondary | ICD-10-CM | POA: Diagnosis not present

## 2023-09-29 DIAGNOSIS — M5459 Other low back pain: Secondary | ICD-10-CM | POA: Diagnosis not present

## 2023-09-29 DIAGNOSIS — M79606 Pain in leg, unspecified: Secondary | ICD-10-CM | POA: Diagnosis not present

## 2023-09-29 DIAGNOSIS — M6281 Muscle weakness (generalized): Secondary | ICD-10-CM | POA: Diagnosis not present

## 2023-09-29 DIAGNOSIS — Z4789 Encounter for other orthopedic aftercare: Secondary | ICD-10-CM | POA: Diagnosis not present

## 2023-10-06 DIAGNOSIS — Z4789 Encounter for other orthopedic aftercare: Secondary | ICD-10-CM | POA: Diagnosis not present

## 2023-10-06 DIAGNOSIS — M79606 Pain in leg, unspecified: Secondary | ICD-10-CM | POA: Diagnosis not present

## 2023-10-06 DIAGNOSIS — M5459 Other low back pain: Secondary | ICD-10-CM | POA: Diagnosis not present

## 2023-10-06 DIAGNOSIS — R2689 Other abnormalities of gait and mobility: Secondary | ICD-10-CM | POA: Diagnosis not present

## 2023-10-06 DIAGNOSIS — M6281 Muscle weakness (generalized): Secondary | ICD-10-CM | POA: Diagnosis not present

## 2023-10-12 DIAGNOSIS — F4321 Adjustment disorder with depressed mood: Secondary | ICD-10-CM | POA: Diagnosis not present

## 2023-10-13 DIAGNOSIS — M79606 Pain in leg, unspecified: Secondary | ICD-10-CM | POA: Diagnosis not present

## 2023-10-13 DIAGNOSIS — R2689 Other abnormalities of gait and mobility: Secondary | ICD-10-CM | POA: Diagnosis not present

## 2023-10-13 DIAGNOSIS — M5459 Other low back pain: Secondary | ICD-10-CM | POA: Diagnosis not present

## 2023-10-13 DIAGNOSIS — Z4789 Encounter for other orthopedic aftercare: Secondary | ICD-10-CM | POA: Diagnosis not present

## 2023-10-13 DIAGNOSIS — M6281 Muscle weakness (generalized): Secondary | ICD-10-CM | POA: Diagnosis not present

## 2023-10-18 DIAGNOSIS — R2689 Other abnormalities of gait and mobility: Secondary | ICD-10-CM | POA: Diagnosis not present

## 2023-10-18 DIAGNOSIS — M79606 Pain in leg, unspecified: Secondary | ICD-10-CM | POA: Diagnosis not present

## 2023-10-18 DIAGNOSIS — M6281 Muscle weakness (generalized): Secondary | ICD-10-CM | POA: Diagnosis not present

## 2023-10-18 DIAGNOSIS — Z4789 Encounter for other orthopedic aftercare: Secondary | ICD-10-CM | POA: Diagnosis not present

## 2023-10-18 DIAGNOSIS — M5459 Other low back pain: Secondary | ICD-10-CM | POA: Diagnosis not present

## 2023-10-19 DIAGNOSIS — R635 Abnormal weight gain: Secondary | ICD-10-CM | POA: Diagnosis not present

## 2023-10-19 DIAGNOSIS — Z1331 Encounter for screening for depression: Secondary | ICD-10-CM | POA: Diagnosis not present

## 2023-10-19 DIAGNOSIS — E66813 Obesity, class 3: Secondary | ICD-10-CM | POA: Diagnosis not present

## 2023-10-19 DIAGNOSIS — R600 Localized edema: Secondary | ICD-10-CM | POA: Diagnosis not present

## 2023-10-19 DIAGNOSIS — E65 Localized adiposity: Secondary | ICD-10-CM | POA: Diagnosis not present

## 2023-10-19 DIAGNOSIS — Z6841 Body Mass Index (BMI) 40.0 and over, adult: Secondary | ICD-10-CM | POA: Diagnosis not present

## 2023-10-19 DIAGNOSIS — R030 Elevated blood-pressure reading, without diagnosis of hypertension: Secondary | ICD-10-CM | POA: Diagnosis not present

## 2023-10-19 DIAGNOSIS — R7309 Other abnormal glucose: Secondary | ICD-10-CM | POA: Diagnosis not present

## 2023-10-19 DIAGNOSIS — E785 Hyperlipidemia, unspecified: Secondary | ICD-10-CM | POA: Diagnosis not present

## 2023-10-19 DIAGNOSIS — M8589 Other specified disorders of bone density and structure, multiple sites: Secondary | ICD-10-CM | POA: Diagnosis not present

## 2023-10-19 DIAGNOSIS — G4733 Obstructive sleep apnea (adult) (pediatric): Secondary | ICD-10-CM | POA: Diagnosis not present

## 2023-10-19 DIAGNOSIS — K219 Gastro-esophageal reflux disease without esophagitis: Secondary | ICD-10-CM | POA: Diagnosis not present

## 2023-10-24 DIAGNOSIS — R2689 Other abnormalities of gait and mobility: Secondary | ICD-10-CM | POA: Diagnosis not present

## 2023-10-24 DIAGNOSIS — M5459 Other low back pain: Secondary | ICD-10-CM | POA: Diagnosis not present

## 2023-10-24 DIAGNOSIS — Z4789 Encounter for other orthopedic aftercare: Secondary | ICD-10-CM | POA: Diagnosis not present

## 2023-10-24 DIAGNOSIS — F4321 Adjustment disorder with depressed mood: Secondary | ICD-10-CM | POA: Diagnosis not present

## 2023-10-24 DIAGNOSIS — M79606 Pain in leg, unspecified: Secondary | ICD-10-CM | POA: Diagnosis not present

## 2023-10-24 DIAGNOSIS — M6281 Muscle weakness (generalized): Secondary | ICD-10-CM | POA: Diagnosis not present

## 2023-11-02 DIAGNOSIS — E65 Localized adiposity: Secondary | ICD-10-CM | POA: Diagnosis not present

## 2023-11-02 DIAGNOSIS — E66813 Obesity, class 3: Secondary | ICD-10-CM | POA: Diagnosis not present

## 2023-11-02 DIAGNOSIS — M8589 Other specified disorders of bone density and structure, multiple sites: Secondary | ICD-10-CM | POA: Diagnosis not present

## 2023-11-02 DIAGNOSIS — R635 Abnormal weight gain: Secondary | ICD-10-CM | POA: Diagnosis not present

## 2023-11-02 DIAGNOSIS — R600 Localized edema: Secondary | ICD-10-CM | POA: Diagnosis not present

## 2023-11-02 DIAGNOSIS — R030 Elevated blood-pressure reading, without diagnosis of hypertension: Secondary | ICD-10-CM | POA: Diagnosis not present

## 2023-11-02 DIAGNOSIS — G4733 Obstructive sleep apnea (adult) (pediatric): Secondary | ICD-10-CM | POA: Diagnosis not present

## 2023-11-02 DIAGNOSIS — K219 Gastro-esophageal reflux disease without esophagitis: Secondary | ICD-10-CM | POA: Diagnosis not present

## 2023-11-02 DIAGNOSIS — E785 Hyperlipidemia, unspecified: Secondary | ICD-10-CM | POA: Diagnosis not present

## 2023-11-07 DIAGNOSIS — F4321 Adjustment disorder with depressed mood: Secondary | ICD-10-CM | POA: Diagnosis not present

## 2023-11-08 DIAGNOSIS — M5459 Other low back pain: Secondary | ICD-10-CM | POA: Diagnosis not present

## 2023-11-08 DIAGNOSIS — R2689 Other abnormalities of gait and mobility: Secondary | ICD-10-CM | POA: Diagnosis not present

## 2023-11-08 DIAGNOSIS — M79606 Pain in leg, unspecified: Secondary | ICD-10-CM | POA: Diagnosis not present

## 2023-11-08 DIAGNOSIS — M6281 Muscle weakness (generalized): Secondary | ICD-10-CM | POA: Diagnosis not present

## 2023-11-08 DIAGNOSIS — Z4789 Encounter for other orthopedic aftercare: Secondary | ICD-10-CM | POA: Diagnosis not present

## 2023-11-14 DIAGNOSIS — J189 Pneumonia, unspecified organism: Secondary | ICD-10-CM | POA: Diagnosis not present

## 2023-11-14 DIAGNOSIS — F339 Major depressive disorder, recurrent, unspecified: Secondary | ICD-10-CM | POA: Diagnosis not present

## 2023-11-14 DIAGNOSIS — E785 Hyperlipidemia, unspecified: Secondary | ICD-10-CM | POA: Diagnosis not present

## 2023-11-16 DIAGNOSIS — E66813 Obesity, class 3: Secondary | ICD-10-CM | POA: Diagnosis not present

## 2023-11-16 DIAGNOSIS — G4733 Obstructive sleep apnea (adult) (pediatric): Secondary | ICD-10-CM | POA: Diagnosis not present

## 2023-11-16 DIAGNOSIS — M8589 Other specified disorders of bone density and structure, multiple sites: Secondary | ICD-10-CM | POA: Diagnosis not present

## 2023-11-16 DIAGNOSIS — R635 Abnormal weight gain: Secondary | ICD-10-CM | POA: Diagnosis not present

## 2023-11-16 DIAGNOSIS — E65 Localized adiposity: Secondary | ICD-10-CM | POA: Diagnosis not present

## 2023-11-16 DIAGNOSIS — R030 Elevated blood-pressure reading, without diagnosis of hypertension: Secondary | ICD-10-CM | POA: Diagnosis not present

## 2023-11-16 DIAGNOSIS — R600 Localized edema: Secondary | ICD-10-CM | POA: Diagnosis not present

## 2023-11-16 DIAGNOSIS — Z6841 Body Mass Index (BMI) 40.0 and over, adult: Secondary | ICD-10-CM | POA: Diagnosis not present

## 2023-11-16 DIAGNOSIS — E785 Hyperlipidemia, unspecified: Secondary | ICD-10-CM | POA: Diagnosis not present

## 2023-11-21 DIAGNOSIS — F4321 Adjustment disorder with depressed mood: Secondary | ICD-10-CM | POA: Diagnosis not present

## 2023-11-23 DIAGNOSIS — M79642 Pain in left hand: Secondary | ICD-10-CM | POA: Diagnosis not present

## 2023-11-23 DIAGNOSIS — M19042 Primary osteoarthritis, left hand: Secondary | ICD-10-CM | POA: Diagnosis not present

## 2023-11-23 DIAGNOSIS — M174 Other bilateral secondary osteoarthritis of knee: Secondary | ICD-10-CM | POA: Diagnosis not present

## 2023-11-23 DIAGNOSIS — M79641 Pain in right hand: Secondary | ICD-10-CM | POA: Diagnosis not present

## 2023-11-23 DIAGNOSIS — M19041 Primary osteoarthritis, right hand: Secondary | ICD-10-CM | POA: Diagnosis not present

## 2023-11-23 NOTE — Progress Notes (Signed)
 RETURN  PATIENT CLINIC NOTE - KNEE PAIN    SUBJECTIVE Breanna Riley is a 63 y.o. female who returns to clinic in follow-up for bilateral knee pain.  She received bilateral knee Supartz injections in December 2024.  She states that these injections provided over 80% relief for about 6 months and now the pain is gradually returned.  She is interested in repeat viscosupplementation injections.  She denies fevers, chills, locking, catching.  She denies having any trauma or injury since her last clinic visit.  She is also complaining of bilateral hand pain.  She is right-hand dominant.  The symptoms began in the left hand about 3 weeks ago and then symptoms started in her right hand 2 weeks ago.  She denies having any trauma or injuries.  She points to various MCP PIP and DIP joints of the location of her pain.  The worst pain is the left third MCP joint.  She does endorse morning stiffness that lasts less than 30 minutes.  She states that she has a difficult time making a fist for about the first 15 to 30 minutes after waking up in the morning but then with movement her symptoms do improve and she is able to make a fist. Bending her fingers backwards also helps decrease the pain.  She is not currently dropping objects.  She denies fevers, chills, numbness, tingling.  She is taking meloxicam 15 mg daily without any notable relief.  She is also taking Lyrica 200 mg 3 times a day and this is controlling her nerve pain but her bilateral hand pain persist.  She is also wondering if all of her swelling in her hands is due to her  pregabalin intake.  Past Medical History:  Diagnosis Date  . Anemia   . Anesthesia complication    Was unable to get numb and had to be put to sleep for c-section. Needs more numbing medication for dental procedures as well.No probles with past surgeries.  . Back pain   . GERD (gastroesophageal reflux disease)   . Sleep apnea   . Walker as ambulation aid   . Wears glasses     Past  Surgical History:  Procedure Laterality Date  . Cesarean section    . Lumbar fusion  12/21/2022  . Lumbar laminectomy  11/29/2019  . Lumbar wound debridement  12/18/2019  . Trigger finger release Left     Current Outpatient Medications  Medication Instructions  . atorvastatin (LIPITOR) 20 mg, At bedtime  . budesonide-formoterol (SYMBICORT) 160-4.5 mcg/actuation inhaler 2 puffs, 2 times a day  . cyclobenzaprine (FLEXERIL) 10 mg, With breakfast as needed  . Diclofenac Sodium 3 % GEL 3 times a day  . esomeprazole magnesium (NEXIUM) 40 mg, Daily before breakfast  . hydroCHLOROthiazide 25 mg tablet Every morning  . meloxicam (MOBIC) 15 mg, Every morning  . methocarbamol (ROBAXIN) 500 mg, As needed  . polyethylene glycol (MIRALAX) 17 g, Daily as needed  . pregabalin (LYRICA) 200 mg, 3 times a day  . semaglutide (RYBELSUS) 3 mg, Every morning  . sertraline (ZOLOFT) 50 mg tablet 1 tablet, Every morning  . SM VITAMIN D3 2,000 Units, Every morning  . traMADol (ULTRAM) 50 mg, Oral, Every 8 hours as needed  . triamcinolone (ARISTOCORT,KENALOG) 0.5% cream 1 Application, 3 times daily  . vitamin B-12 (CYANOCOBALAMIN) 1,000 mcg, Daily  . vitamin C (ASCORBIC ACID) 250 mg, Daily    OBJECTIVE  Vitals:   11/23/23 1100  BP: 124/66  Pulse: 76  SpO2: 97%    Bilateral Knees: Inspection: No erythema, edema, or ecchymosis noted over the knee Palpation: Tenderness to palpation over the bilateral medial > lateral joint lines Range of Motion:  -- Knee flexion equals to 110 degrees -- Knee extension equal to 0 degrees Sensation:  -- Intact to light touch over the bilateral knees Special Tests:  increased knee pain bilaterally with passive flexion and abduction  Bilateral Hands: Inspection: No erythema or ecchymosis. Diffuse edema is noted in all 10 fingers Palpation: Tender to palpation over the MCP joint of the left middle finger, PIP joint of the Left pinky finger, DIP joint of the Right  index finger, and CMC joint of the Right thumb  ROM: Able to make a full composite fist bilaterally Strength: 5/5 strength with finger flexion, finger extension, finger abduction, and finger adduction  Imaging: Bilateral Knee X-Rays (10-25-2022): No fracture or dislocation.  Medial and lateral femorotibial compartment joint space narrowing with osteophytosis.  Patellofemoral compartments are preserved.  No effusion. Bilateral Hand X-rays (11-23-2023): No fractures or dislocation.  Joint space narrowing is noted in multiple joints in the bilateral hands, worst in the left 5th PIP joint, right 1st MCP and IP joints, and right 2nd DIP joints. Diffuse edema is also noted.   ASSESSMENT Breanna Riley is a 63 y.o. female who presents to clinic with  1. Primary osteoarthritis of both hands      2. Other bilateral secondary osteoarthritis of knee  Ambulatory referral to Orthopedic Surgery     We had a good discussion regarding the treatment plan.  For her bilateral knee osteoarthritis, we will begin the insurance authorization process for repeat HA injections.  She received bilateral knee Supartz injections in December 2024 with more than 80% relief in both knees for about 6 months.  For her bilateral hand osteoarthritis, I recommend that she try Voltaren gel for the bilateral hand 3 times a day as needed, and she can also try putting her hand in an Epsom salt bath or hot water.  She can continue taking meloxicam 15 mg daily.  If no improvement with this conservative treatment, we can also consider referral to occupational therapy.  In the future, we can also consider switching her from meloxicam to diclofenac to see if she obtains any more symptomatic improvement.  Lastly, we will also decrease her pregabalin intake to 200 mg twice daily as chronic pregabalin use can contribute to swelling in the extremities.  She is amenable to this treatment plan.  PLAN Start authorization process for viscosupplementation  injections into the bilateral knee Recommend applying Voltaren gel to bilateral hands at site of pain 3 times a day as needed Continue meloxicam 15 mg daily Decrease pregabalin to 200 mg twice daily.  Patient has been instructed to skip the morning dose. Continue taking tramadol 50 mg daily as needed.  Refill has been provided today in clinic. Follow-up with Dr. Tobie on first date of viscosupplementation injections.   Laverda GEANNIE Tobie, DO CAQSM [facility] - [facility]

## 2023-11-25 DIAGNOSIS — M7062 Trochanteric bursitis, left hip: Secondary | ICD-10-CM | POA: Diagnosis not present

## 2023-11-25 DIAGNOSIS — Z981 Arthrodesis status: Secondary | ICD-10-CM | POA: Diagnosis not present

## 2023-11-25 DIAGNOSIS — M47816 Spondylosis without myelopathy or radiculopathy, lumbar region: Secondary | ICD-10-CM | POA: Diagnosis not present

## 2023-12-05 DIAGNOSIS — F4321 Adjustment disorder with depressed mood: Secondary | ICD-10-CM | POA: Diagnosis not present

## 2023-12-06 DIAGNOSIS — Z1231 Encounter for screening mammogram for malignant neoplasm of breast: Secondary | ICD-10-CM | POA: Diagnosis not present

## 2023-12-09 DIAGNOSIS — M545 Low back pain, unspecified: Secondary | ICD-10-CM | POA: Diagnosis not present

## 2023-12-09 DIAGNOSIS — Z981 Arthrodesis status: Secondary | ICD-10-CM | POA: Diagnosis not present

## 2023-12-09 DIAGNOSIS — G8929 Other chronic pain: Secondary | ICD-10-CM | POA: Diagnosis not present

## 2023-12-09 DIAGNOSIS — M791 Myalgia, unspecified site: Secondary | ICD-10-CM | POA: Diagnosis not present

## 2023-12-12 DIAGNOSIS — Z6841 Body Mass Index (BMI) 40.0 and over, adult: Secondary | ICD-10-CM | POA: Diagnosis not present

## 2023-12-12 DIAGNOSIS — Z800 Family history of malignant neoplasm of digestive organs: Secondary | ICD-10-CM | POA: Diagnosis not present

## 2023-12-12 DIAGNOSIS — K648 Other hemorrhoids: Secondary | ICD-10-CM | POA: Diagnosis not present

## 2023-12-12 DIAGNOSIS — J45909 Unspecified asthma, uncomplicated: Secondary | ICD-10-CM | POA: Diagnosis not present

## 2023-12-12 DIAGNOSIS — Z1211 Encounter for screening for malignant neoplasm of colon: Secondary | ICD-10-CM | POA: Diagnosis not present

## 2023-12-12 DIAGNOSIS — G4733 Obstructive sleep apnea (adult) (pediatric): Secondary | ICD-10-CM | POA: Diagnosis not present

## 2023-12-12 DIAGNOSIS — E669 Obesity, unspecified: Secondary | ICD-10-CM | POA: Diagnosis not present

## 2023-12-12 DIAGNOSIS — K5730 Diverticulosis of large intestine without perforation or abscess without bleeding: Secondary | ICD-10-CM | POA: Diagnosis not present

## 2023-12-12 DIAGNOSIS — I10 Essential (primary) hypertension: Secondary | ICD-10-CM | POA: Diagnosis not present

## 2023-12-12 DIAGNOSIS — K219 Gastro-esophageal reflux disease without esophagitis: Secondary | ICD-10-CM | POA: Diagnosis not present

## 2023-12-12 DIAGNOSIS — Z7951 Long term (current) use of inhaled steroids: Secondary | ICD-10-CM | POA: Diagnosis not present

## 2023-12-12 NOTE — H&P (Signed)
 [facility]  History and Physical   Assessment  63 year old female presents today for a screening colonoscopy  Plan  Screening colonoscopy today  History  Breanna Riley is a 63 year old female with a past medical history of GERD, obesity with a BMI of 57.81, sleep apnea, hyperlipidemia and back pain presents today for a medical screening colonoscopy.  She does have a family history of colon cancer in her cousin at the age of 27.  Past Medical History:  Diagnosis Date  . Anemia   . Anesthesia complication    Was unable to get numb and had to be put to sleep for c-section. Needs more numbing medication for dental procedures as well.No probles with past surgeries.  . Back pain   . GERD (gastroesophageal reflux disease)   . Sleep apnea   . Walker as ambulation aid   . Wears glasses    Past Surgical History:  Procedure Laterality Date  . Cesarean section    . Lumbar fusion  12/21/2022  . Lumbar laminectomy  11/29/2019  . Lumbar wound debridement  12/18/2019  . Trigger finger release Left     Allergies[1] Prior to Admission medications  Medication Sig Start Date End Date Taking? Authorizing Provider  atorvastatin (LIPITOR) 20 mg tablet Take one tablet (20 mg dose) by mouth at bedtime.   Yes Historical Provider, MD  budesonide-formoterol (SYMBICORT) 160-4.5 mcg/actuation inhaler Inhale two puffs into the lungs 2 (two) times daily. 10/08/23  Yes Historical Provider, MD  cholecalciferol (SM VITAMIN D3) 1,000 units (25 mcg) tablet Take two tablets (2,000 Units dose) by mouth every morning. Two tablets once a day   Yes Historical Provider, MD  cyclobenzaprine (FLEXERIL) 10 mg tablet Take one tablet (10 mg dose) by mouth with breakfast as needed.   Yes Historical Provider, MD  diazepam  (VALIUM ) 5 mg tablet Take 1 tablet by oral route 30 minutes prior to procedure. Can repeat 15 minutes prior to procedure. Patient not taking: Reported on 12/12/2023  11/30/23   Kristyn Heagen, PA-C  Diclofenac Sodium 3 % GEL Apply topically 3 (three) times a day.   Yes Historical Provider, MD  esomeprazole magnesium (NEXIUM) 40 mg capsule Take one capsule (40 mg dose) by mouth 1 (one) time each day before breakfast.   Yes Historical Provider, MD  hydroCHLOROthiazide 25 mg tablet every morning. 11/02/23  Yes Historical Provider, MD  meloxicam (MOBIC) 15 mg tablet Take one tablet (15 mg dose) by mouth every morning. 12/08/23  Yes Kristyn Heagen, PA-C  methocarbamol (ROBAXIN) 500 mg tablet Take one tablet (500 mg dose) by mouth as needed.   Yes Historical Provider, MD  NA sulfate-potassium sulfate-magnesium sulfate (SUPREP BOWEL PREP) 17.5-3.13-1.6 GM/177ML Take 177 mLs by mouth 2 (two) times. 12/02/23  Yes Onetha JINNY Larger, MD  polyethylene glycol (MIRALAX) 17 g packet Take 120 mLs (17 g dose) by mouth daily as needed.    Historical Provider, MD  pregabalin (LYRICA) 200 MG capsule Take one capsule (200 mg dose) by mouth 3 (three) times a day.   Yes Historical Provider, MD  semaglutide (RYBELSUS) 3 MG tablet Take one tablet (3 mg dose) by mouth every morning.   Yes Historical Provider, MD  sertraline (ZOLOFT) 50 mg tablet Take one tablet (50 mg dose) by mouth every morning. 11/08/22  Yes Historical Provider, MD  traMADol (ULTRAM) 50 mg tablet Take one tablet (50 mg dose) by mouth every 8 (eight) hours as needed for Pain for up to 30 days. Max  Daily Amount: 150 mg 11/23/23 12/23/23 Yes Laverda Blanch, DO  triamcinolone (ARISTOCORT,KENALOG) 0.5% cream Apply one application. topically 3 (three) times a day.    Historical Provider, MD  vitamin B-12 (CYANOCOBALAMIN) 1000 mcg tablet Take one tablet (1,000 mcg dose) by mouth daily.   Yes Historical Provider, MD  vitamin C (ASCORBIC ACID) 250 mg tablet Take one tablet (250 mg dose) by mouth daily.   Yes Historical Provider, MD   Social History[2] Family History  Problem Relation Age of Onset  . Stroke Mother   . Hypertension Father    . Diabetes Father   . Arthritis Father   . Gout Father   . Heart disease Neg Hx   . Thyroid  disease Neg Hx    Review of Systems  Constitutional:  Negative for fatigue, fever and unexpected weight change.  HENT:  Negative for trouble swallowing.   Respiratory:  Negative for shortness of breath, wheezing and stridor.   Cardiovascular:  Negative for chest pain.  Gastrointestinal:  Negative for abdominal pain, anal bleeding, blood in stool, constipation, diarrhea, nausea, rectal pain and vomiting.  Psychiatric/Behavioral:  Negative for agitation.          Physical Examination  Temp:  [98.1 F (36.7 C)] 98.1 F (36.7 C) Heart Rate:  [77] 77 Resp:  [16] 16 BP: (142)/(65) 142/65 SpO2:  [97 %] 97 % Pain Score: 0-No pain O2 Device: None (Room air)    Physical Exam Constitutional:      Appearance: She is obese.  HENT:     Mouth/Throat:     Mouth: Mucous membranes are moist.   Eyes:     Conjunctiva/sclera: Conjunctivae normal.    Cardiovascular:     Rate and Rhythm: Normal rate and regular rhythm.   Musculoskeletal:     Cervical back: Normal range of motion.  Pulmonary:     Effort: Pulmonary effort is normal.     Breath sounds: Normal breath sounds.  Abdominal:     General: There is no distension.     Palpations: There is no mass.     Tenderness: There is no abdominal tenderness. There is no guarding or rebound.     Hernia: No hernia is present.  Neurological:     General: No focal deficit present.     Mental Status: She is alert.   Psychiatric:        Mood and Affect: Mood normal.   Results  Labs:  Recent Results (from the past 24 hours)  POCT Glucose   Collection Time: 12/12/23 11:36 AM  Result Value Ref Range   Glucose, POC 98 70 - 99 mg/dL   OPERATOR ID [id number]    INSTRUMENT ID [id number]    Imaging: No results found.  Coding  Electronically signed: Onetha JINNY Larger, MD 12/12/2023 / 12:36 PM       [1] Allergies Allergen Reactions  .  Naproxen Bleeding and Other    Nose bleeding  [2] Social History Socioeconomic History  . Marital status: Single  Tobacco Use  . Smoking status: Never    Passive exposure: Past  . Smokeless tobacco: Never  Vaping Use  . Vaping status: Never Used  Substance and Sexual Activity  . Alcohol use: Not Currently    Comment: Social

## 2023-12-13 DIAGNOSIS — M170 Bilateral primary osteoarthritis of knee: Secondary | ICD-10-CM | POA: Diagnosis not present

## 2023-12-14 DIAGNOSIS — M7062 Trochanteric bursitis, left hip: Secondary | ICD-10-CM | POA: Diagnosis not present

## 2023-12-15 DIAGNOSIS — F339 Major depressive disorder, recurrent, unspecified: Secondary | ICD-10-CM | POA: Diagnosis not present

## 2023-12-15 DIAGNOSIS — E785 Hyperlipidemia, unspecified: Secondary | ICD-10-CM | POA: Diagnosis not present

## 2023-12-15 DIAGNOSIS — J189 Pneumonia, unspecified organism: Secondary | ICD-10-CM | POA: Diagnosis not present

## 2023-12-19 DIAGNOSIS — F4321 Adjustment disorder with depressed mood: Secondary | ICD-10-CM | POA: Diagnosis not present

## 2023-12-20 DIAGNOSIS — M170 Bilateral primary osteoarthritis of knee: Secondary | ICD-10-CM | POA: Diagnosis not present

## 2023-12-21 DIAGNOSIS — M8589 Other specified disorders of bone density and structure, multiple sites: Secondary | ICD-10-CM | POA: Diagnosis not present

## 2023-12-21 DIAGNOSIS — Z6841 Body Mass Index (BMI) 40.0 and over, adult: Secondary | ICD-10-CM | POA: Diagnosis not present

## 2023-12-21 DIAGNOSIS — E65 Localized adiposity: Secondary | ICD-10-CM | POA: Diagnosis not present

## 2023-12-21 DIAGNOSIS — E66813 Obesity, class 3: Secondary | ICD-10-CM | POA: Diagnosis not present

## 2023-12-21 DIAGNOSIS — E785 Hyperlipidemia, unspecified: Secondary | ICD-10-CM | POA: Diagnosis not present

## 2023-12-21 DIAGNOSIS — G4733 Obstructive sleep apnea (adult) (pediatric): Secondary | ICD-10-CM | POA: Diagnosis not present

## 2023-12-27 DIAGNOSIS — S92511A Displaced fracture of proximal phalanx of right lesser toe(s), initial encounter for closed fracture: Secondary | ICD-10-CM | POA: Diagnosis not present

## 2023-12-27 DIAGNOSIS — M170 Bilateral primary osteoarthritis of knee: Secondary | ICD-10-CM | POA: Diagnosis not present

## 2024-01-02 DIAGNOSIS — F4321 Adjustment disorder with depressed mood: Secondary | ICD-10-CM | POA: Diagnosis not present

## 2024-01-13 DIAGNOSIS — M7061 Trochanteric bursitis, right hip: Secondary | ICD-10-CM | POA: Diagnosis not present

## 2024-01-15 DIAGNOSIS — E785 Hyperlipidemia, unspecified: Secondary | ICD-10-CM | POA: Diagnosis not present

## 2024-01-15 DIAGNOSIS — J189 Pneumonia, unspecified organism: Secondary | ICD-10-CM | POA: Diagnosis not present

## 2024-01-15 DIAGNOSIS — F339 Major depressive disorder, recurrent, unspecified: Secondary | ICD-10-CM | POA: Diagnosis not present

## 2024-01-17 DIAGNOSIS — F4321 Adjustment disorder with depressed mood: Secondary | ICD-10-CM | POA: Diagnosis not present

## 2024-01-19 DIAGNOSIS — G4733 Obstructive sleep apnea (adult) (pediatric): Secondary | ICD-10-CM | POA: Diagnosis not present

## 2024-01-19 DIAGNOSIS — E66813 Obesity, class 3: Secondary | ICD-10-CM | POA: Diagnosis not present

## 2024-01-19 DIAGNOSIS — E785 Hyperlipidemia, unspecified: Secondary | ICD-10-CM | POA: Diagnosis not present

## 2024-01-19 DIAGNOSIS — M8589 Other specified disorders of bone density and structure, multiple sites: Secondary | ICD-10-CM | POA: Diagnosis not present

## 2024-01-19 DIAGNOSIS — E65 Localized adiposity: Secondary | ICD-10-CM | POA: Diagnosis not present

## 2024-01-19 DIAGNOSIS — Z6841 Body Mass Index (BMI) 40.0 and over, adult: Secondary | ICD-10-CM | POA: Diagnosis not present

## 2024-01-25 DIAGNOSIS — M4326 Fusion of spine, lumbar region: Secondary | ICD-10-CM | POA: Diagnosis not present

## 2024-01-25 DIAGNOSIS — M47816 Spondylosis without myelopathy or radiculopathy, lumbar region: Secondary | ICD-10-CM | POA: Diagnosis not present

## 2024-01-25 NOTE — Progress Notes (Signed)
 ------------------------------------------------------------------------------- Attestation signed by Royden LELON Schneider, MD at 01/25/24 1910 I personally interviewed and examined the patient. I reviewed and agree with the Physician Assistant's note and personally formulated the following plan: Patient doing well postop.  She is progressing nicely.  She is successfully losing weight.  Discussed home exercise program to include stretching.  40 minutes total time -------------------------------------------------------------------------------    [facility]- [facility] Orthopaedic Spine Surgery Outpatient Visit   Breanna Riley  DOB: Aug 05, 1960  MRN: [id number] 01/25/2024     Subjective:    Breanna Riley is a 63 y.o. (DOB 07/13/1960) female.  HPI Patient presents 1 year s/p L3-4 OLIF on 12/21/22 with Dr. Schneider. H/o L4-S1 lami, PSF on 11/29/2019 with Dr. Schneider. She does have intermittent stinging pain in bilateral legs over anterior aspect of her legs, stopping at the knees. This is intermittent and usually now only occurring once a month. Denies any B/b dysfunction or saddle anesthesia. She recently fractured her toe and is wearing a right foot immobilizer. She is happy to report having lost weight from 303 to 273lbs.  She continues to try to remain as active as possible. She has recently joined a Building surveyor using her Silver sneakers program. She exercises regularly in the pool. She avoids the water aerobics classes as they seem to be more social. She spends her time doing more aggressive exercises in the pool. She is also interested in beginning to walk on the track and use some of the machines.   Takes lyrica 200mg  TID, flexeril 10mg  as needed and meloxicam.  IMAGING Lumbar x-rays 12/09/2023 and reviewed with the patient.  L3-4 OLIF.  L4-S1 posterior spinal fusion.  Good position alignment of instrumentation, no signs of pseudoarthrosis or hardware failure.  No acute  findings.  Lumbar XR 07/21/23 on IQ showing L3-4 standalone OLIF and L4-S1 PSF with hardware intact and well positioned, no loosening or complications. no acute fractures noted.    Reviewed and updated this visit by provider: Tobacco  Allergies  Meds  Problems  Med Hx  Surg Hx  Fam Hx       Review of Systems  Constitutional:  Negative for activity change, appetite change, chills, diaphoresis, fatigue, fever and unexpected weight change.  HENT: Negative.    Eyes: Negative.   Respiratory: Negative.  Negative for shortness of breath.   Cardiovascular:  Negative for chest pain, palpitations and leg swelling.  Gastrointestinal: Negative.   Endocrine: Negative.   Genitourinary: Negative.   Musculoskeletal:  Positive for back pain. Negative for arthralgias, gait problem, joint swelling, myalgias and neck pain.  Skin:  Negative for color change and wound.  Allergic/Immunologic: Negative.   Neurological:  Negative for weakness, numbness and headaches.  Hematological: Negative.   Psychiatric/Behavioral: Negative.  Negative for hallucinations, self-injury and sleep disturbance.       Objective:   Vitals:   01/25/24 1445  BP: (!) 105/55  Pulse: 81  Resp: 18  Height: 4' 11 (1.499 m)  Weight: 273 lb (123.8 kg)  SpO2: 98%  BMI (Calculated): 55.1  PainSc:   6  PainLoc: Back    Constitutional: She appears healthy. No distress.  Musculoskeletal:        General: No tenderness or edema.     Cervical back: Normal range of motion.     Lumbar back: Negative right straight leg raise test and negative left straight leg raise test.  Neurological: She is alert and oriented to person, place, and time.  Skin: Skin is warm and dry.  Back Exam   Tenderness  The patient is experiencing no tenderness.   Range of Motion  Extension:  normal  Flexion:  normal  Lateral bend right:  normal  Lateral bend left:  normal  Rotation right:  normal  Rotation left:  normal   Muscle Strength   The patient has normal back strength.  Tests  Straight leg raise right: negative Straight leg raise left: negative  Reflexes  Patellar:  normal Achilles:  normal Biceps:  normal Babinski's sign: normal   Other  Toe walk: normal Heel walk: normal Sensation: normal Gait: normal  Erythema: no back redness Scars: absent            Assessment / Plan:   ASSESSMENT 1. Fusion of lumbar spine (Primary) 2. Lumbar spondylosis    PLAN Patient has recovered well from surgery. We encouraged her to continue her weight loss efforts and continue walking and staying active. Questions were encouraged and answered. Patient was instructed to call with any new concerns or problems. F/u in 48mo for med check with Elveria.  Can consider weaning off Lyrica if the intermittent nerve continues to improve.   Depression screening, including interpretation, was performed.  Total time spent performing and reviewing the screening was > 5 minutes.  Risks, benefits, and alternatives of the medications and treatment plan prescribed today were discussed, and patient expressed understanding. Plan follow-up as discussed or as needed if any worsening symptoms or change in condition.    ____________________________________________________________________________ Breanna Riley Elveria Hoit, PA, spent a total of 15 minutes today involved with patient care activities including preparing to see the patient such as reviewing the patient record, counseling and educating the patient, family, and/or caregiver, and documenting clinical information in the electronic or other health record.

## 2024-01-30 DIAGNOSIS — F4321 Adjustment disorder with depressed mood: Secondary | ICD-10-CM | POA: Diagnosis not present

## 2024-02-13 DIAGNOSIS — F4321 Adjustment disorder with depressed mood: Secondary | ICD-10-CM | POA: Diagnosis not present

## 2024-02-14 ENCOUNTER — Inpatient Hospital Stay (HOSPITAL_COMMUNITY)
Admission: EM | Admit: 2024-02-14 | Discharge: 2024-02-28 | DRG: 163 | Disposition: A | Discharge status: Home Health | Attending: Internal Medicine | Admitting: Internal Medicine

## 2024-02-14 ENCOUNTER — Emergency Department (HOSPITAL_COMMUNITY)

## 2024-02-14 ENCOUNTER — Inpatient Hospital Stay (HOSPITAL_COMMUNITY)

## 2024-02-14 ENCOUNTER — Other Ambulatory Visit: Payer: Self-pay

## 2024-02-14 ENCOUNTER — Encounter (HOSPITAL_COMMUNITY): Payer: Self-pay | Admitting: Internal Medicine

## 2024-02-14 DIAGNOSIS — I2609 Other pulmonary embolism with acute cor pulmonale: Principal | ICD-10-CM | POA: Diagnosis present

## 2024-02-14 DIAGNOSIS — R7989 Other specified abnormal findings of blood chemistry: Secondary | ICD-10-CM | POA: Diagnosis not present

## 2024-02-14 DIAGNOSIS — Y838 Other surgical procedures as the cause of abnormal reaction of the patient, or of later complication, without mention of misadventure at the time of the procedure: Secondary | ICD-10-CM | POA: Diagnosis not present

## 2024-02-14 DIAGNOSIS — R42 Dizziness and giddiness: Secondary | ICD-10-CM | POA: Diagnosis not present

## 2024-02-14 DIAGNOSIS — I824Z3 Acute embolism and thrombosis of unspecified deep veins of distal lower extremity, bilateral: Secondary | ICD-10-CM | POA: Diagnosis present

## 2024-02-14 DIAGNOSIS — E872 Acidosis, unspecified: Secondary | ICD-10-CM | POA: Diagnosis present

## 2024-02-14 DIAGNOSIS — W19XXXA Unspecified fall, initial encounter: Secondary | ICD-10-CM | POA: Diagnosis not present

## 2024-02-14 DIAGNOSIS — E785 Hyperlipidemia, unspecified: Secondary | ICD-10-CM | POA: Diagnosis not present

## 2024-02-14 DIAGNOSIS — G4733 Obstructive sleep apnea (adult) (pediatric): Secondary | ICD-10-CM | POA: Diagnosis present

## 2024-02-14 DIAGNOSIS — L7622 Postprocedural hemorrhage and hematoma of skin and subcutaneous tissue following other procedure: Secondary | ICD-10-CM | POA: Diagnosis not present

## 2024-02-14 DIAGNOSIS — I214 Non-ST elevation (NSTEMI) myocardial infarction: Secondary | ICD-10-CM | POA: Diagnosis not present

## 2024-02-14 DIAGNOSIS — F339 Major depressive disorder, recurrent, unspecified: Secondary | ICD-10-CM | POA: Diagnosis not present

## 2024-02-14 DIAGNOSIS — R600 Localized edema: Secondary | ICD-10-CM | POA: Diagnosis not present

## 2024-02-14 DIAGNOSIS — D649 Anemia, unspecified: Secondary | ICD-10-CM | POA: Diagnosis not present

## 2024-02-14 DIAGNOSIS — D62 Acute posthemorrhagic anemia: Secondary | ICD-10-CM | POA: Diagnosis not present

## 2024-02-14 DIAGNOSIS — E66813 Obesity, class 3: Secondary | ICD-10-CM | POA: Diagnosis present

## 2024-02-14 DIAGNOSIS — I82442 Acute embolism and thrombosis of left tibial vein: Secondary | ICD-10-CM | POA: Diagnosis present

## 2024-02-14 DIAGNOSIS — Z981 Arthrodesis status: Secondary | ICD-10-CM

## 2024-02-14 DIAGNOSIS — I5A Non-ischemic myocardial injury (non-traumatic): Secondary | ICD-10-CM | POA: Diagnosis not present

## 2024-02-14 DIAGNOSIS — J189 Pneumonia, unspecified organism: Secondary | ICD-10-CM | POA: Diagnosis not present

## 2024-02-14 DIAGNOSIS — E871 Hypo-osmolality and hyponatremia: Secondary | ICD-10-CM | POA: Diagnosis present

## 2024-02-14 DIAGNOSIS — I361 Nonrheumatic tricuspid (valve) insufficiency: Secondary | ICD-10-CM | POA: Diagnosis present

## 2024-02-14 DIAGNOSIS — I110 Hypertensive heart disease with heart failure: Secondary | ICD-10-CM | POA: Diagnosis present

## 2024-02-14 DIAGNOSIS — R61 Generalized hyperhidrosis: Secondary | ICD-10-CM | POA: Diagnosis not present

## 2024-02-14 DIAGNOSIS — Z86711 Personal history of pulmonary embolism: Secondary | ICD-10-CM | POA: Diagnosis not present

## 2024-02-14 DIAGNOSIS — R410 Disorientation, unspecified: Secondary | ICD-10-CM | POA: Diagnosis not present

## 2024-02-14 DIAGNOSIS — R4182 Altered mental status, unspecified: Secondary | ICD-10-CM | POA: Diagnosis not present

## 2024-02-14 DIAGNOSIS — Z1152 Encounter for screening for COVID-19: Secondary | ICD-10-CM | POA: Diagnosis not present

## 2024-02-14 DIAGNOSIS — Z886 Allergy status to analgesic agent status: Secondary | ICD-10-CM

## 2024-02-14 DIAGNOSIS — Z79899 Other long term (current) drug therapy: Secondary | ICD-10-CM

## 2024-02-14 DIAGNOSIS — N179 Acute kidney failure, unspecified: Secondary | ICD-10-CM | POA: Diagnosis present

## 2024-02-14 DIAGNOSIS — E861 Hypovolemia: Secondary | ICD-10-CM | POA: Diagnosis present

## 2024-02-14 DIAGNOSIS — R000 Tachycardia, unspecified: Secondary | ICD-10-CM | POA: Diagnosis not present

## 2024-02-14 DIAGNOSIS — K219 Gastro-esophageal reflux disease without esophagitis: Secondary | ICD-10-CM | POA: Diagnosis not present

## 2024-02-14 DIAGNOSIS — D509 Iron deficiency anemia, unspecified: Secondary | ICD-10-CM | POA: Diagnosis present

## 2024-02-14 DIAGNOSIS — A419 Sepsis, unspecified organism: Principal | ICD-10-CM

## 2024-02-14 DIAGNOSIS — R578 Other shock: Secondary | ICD-10-CM | POA: Diagnosis present

## 2024-02-14 DIAGNOSIS — E876 Hypokalemia: Secondary | ICD-10-CM | POA: Diagnosis not present

## 2024-02-14 DIAGNOSIS — R6510 Systemic inflammatory response syndrome (SIRS) of non-infectious origin without acute organ dysfunction: Secondary | ICD-10-CM | POA: Diagnosis not present

## 2024-02-14 DIAGNOSIS — I2699 Other pulmonary embolism without acute cor pulmonale: Secondary | ICD-10-CM | POA: Diagnosis not present

## 2024-02-14 DIAGNOSIS — F32A Depression, unspecified: Secondary | ICD-10-CM | POA: Diagnosis present

## 2024-02-14 DIAGNOSIS — Z791 Long term (current) use of non-steroidal anti-inflammatories (NSAID): Secondary | ICD-10-CM

## 2024-02-14 DIAGNOSIS — S92341A Displaced fracture of fourth metatarsal bone, right foot, initial encounter for closed fracture: Secondary | ICD-10-CM | POA: Diagnosis not present

## 2024-02-14 DIAGNOSIS — F3281 Premenstrual dysphoric disorder: Secondary | ICD-10-CM | POA: Diagnosis not present

## 2024-02-14 DIAGNOSIS — I82411 Acute embolism and thrombosis of right femoral vein: Secondary | ICD-10-CM | POA: Diagnosis present

## 2024-02-14 DIAGNOSIS — I509 Heart failure, unspecified: Secondary | ICD-10-CM | POA: Diagnosis present

## 2024-02-14 DIAGNOSIS — R569 Unspecified convulsions: Secondary | ICD-10-CM

## 2024-02-14 DIAGNOSIS — J9601 Acute respiratory failure with hypoxia: Secondary | ICD-10-CM | POA: Diagnosis present

## 2024-02-14 DIAGNOSIS — I959 Hypotension, unspecified: Secondary | ICD-10-CM | POA: Diagnosis not present

## 2024-02-14 DIAGNOSIS — I10 Essential (primary) hypertension: Secondary | ICD-10-CM

## 2024-02-14 DIAGNOSIS — I2602 Saddle embolus of pulmonary artery with acute cor pulmonale: Secondary | ICD-10-CM | POA: Diagnosis not present

## 2024-02-14 DIAGNOSIS — R7401 Elevation of levels of liver transaminase levels: Secondary | ICD-10-CM | POA: Diagnosis present

## 2024-02-14 DIAGNOSIS — Z8249 Family history of ischemic heart disease and other diseases of the circulatory system: Secondary | ICD-10-CM

## 2024-02-14 DIAGNOSIS — R55 Syncope and collapse: Secondary | ICD-10-CM | POA: Diagnosis present

## 2024-02-14 DIAGNOSIS — Z6841 Body Mass Index (BMI) 40.0 and over, adult: Secondary | ICD-10-CM

## 2024-02-14 DIAGNOSIS — M79671 Pain in right foot: Secondary | ICD-10-CM | POA: Diagnosis not present

## 2024-02-14 DIAGNOSIS — R0989 Other specified symptoms and signs involving the circulatory and respiratory systems: Secondary | ICD-10-CM | POA: Diagnosis not present

## 2024-02-14 DIAGNOSIS — R339 Retention of urine, unspecified: Secondary | ICD-10-CM | POA: Diagnosis not present

## 2024-02-14 DIAGNOSIS — I2694 Multiple subsegmental pulmonary emboli without acute cor pulmonale: Secondary | ICD-10-CM | POA: Diagnosis not present

## 2024-02-14 DIAGNOSIS — I428 Other cardiomyopathies: Secondary | ICD-10-CM | POA: Diagnosis not present

## 2024-02-14 DIAGNOSIS — E860 Dehydration: Secondary | ICD-10-CM | POA: Diagnosis not present

## 2024-02-14 DIAGNOSIS — Z7901 Long term (current) use of anticoagulants: Secondary | ICD-10-CM

## 2024-02-14 LAB — CBC
HCT: 33.2 % — ABNORMAL LOW (ref 36.0–46.0)
Hemoglobin: 10.6 g/dL — ABNORMAL LOW (ref 12.0–15.0)
MCH: 28.1 pg (ref 26.0–34.0)
MCHC: 31.9 g/dL (ref 30.0–36.0)
MCV: 88.1 fL (ref 80.0–100.0)
Platelets: 241 K/uL (ref 150–400)
RBC: 3.77 MIL/uL — ABNORMAL LOW (ref 3.87–5.11)
RDW: 13.5 % (ref 11.5–15.5)
WBC: 8 K/uL (ref 4.0–10.5)
nRBC: 0 % (ref 0.0–0.2)

## 2024-02-14 LAB — CBC WITH DIFFERENTIAL/PLATELET
Abs Immature Granulocytes: 0.07 K/uL (ref 0.00–0.07)
Basophils Absolute: 0 K/uL (ref 0.0–0.1)
Basophils Relative: 0 %
Eosinophils Absolute: 0.1 K/uL (ref 0.0–0.5)
Eosinophils Relative: 1 %
HCT: 35 % — ABNORMAL LOW (ref 36.0–46.0)
Hemoglobin: 11 g/dL — ABNORMAL LOW (ref 12.0–15.0)
Immature Granulocytes: 1 %
Lymphocytes Relative: 18 %
Lymphs Abs: 2.1 K/uL (ref 0.7–4.0)
MCH: 28.1 pg (ref 26.0–34.0)
MCHC: 31.4 g/dL (ref 30.0–36.0)
MCV: 89.3 fL (ref 80.0–100.0)
Monocytes Absolute: 0.4 K/uL (ref 0.1–1.0)
Monocytes Relative: 4 %
Neutro Abs: 8.8 K/uL — ABNORMAL HIGH (ref 1.7–7.7)
Neutrophils Relative %: 76 %
Platelets: 295 K/uL (ref 150–400)
RBC: 3.92 MIL/uL (ref 3.87–5.11)
RDW: 13.5 % (ref 11.5–15.5)
WBC: 11.6 K/uL — ABNORMAL HIGH (ref 4.0–10.5)
nRBC: 0 % (ref 0.0–0.2)

## 2024-02-14 LAB — URINALYSIS, W/ REFLEX TO CULTURE (INFECTION SUSPECTED)
Bilirubin Urine: NEGATIVE
Glucose, UA: NEGATIVE mg/dL
Hgb urine dipstick: NEGATIVE
Ketones, ur: NEGATIVE mg/dL
Leukocytes,Ua: NEGATIVE
Nitrite: NEGATIVE
Protein, ur: 100 mg/dL — AB
Specific Gravity, Urine: 1.015 (ref 1.005–1.030)
pH: 6 (ref 5.0–8.0)

## 2024-02-14 LAB — I-STAT CHEM 8, ED
BUN: 17 mg/dL (ref 8–23)
Calcium, Ion: 1.15 mmol/L (ref 1.15–1.40)
Chloride: 104 mmol/L (ref 98–111)
Creatinine, Ser: 1.6 mg/dL — ABNORMAL HIGH (ref 0.44–1.00)
Glucose, Bld: 247 mg/dL — ABNORMAL HIGH (ref 70–99)
HCT: 34 % — ABNORMAL LOW (ref 36.0–46.0)
Hemoglobin: 11.6 g/dL — ABNORMAL LOW (ref 12.0–15.0)
Potassium: 2.8 mmol/L — ABNORMAL LOW (ref 3.5–5.1)
Sodium: 141 mmol/L (ref 135–145)
TCO2: 22 mmol/L (ref 22–32)

## 2024-02-14 LAB — PROCALCITONIN: Procalcitonin: 0.38 ng/mL

## 2024-02-14 LAB — GLUCOSE, CAPILLARY: Glucose-Capillary: 127 mg/dL — ABNORMAL HIGH (ref 70–99)

## 2024-02-14 LAB — HEPATIC FUNCTION PANEL
ALT: 35 U/L (ref 0–44)
AST: 57 U/L — ABNORMAL HIGH (ref 15–41)
Albumin: 3.2 g/dL — ABNORMAL LOW (ref 3.5–5.0)
Alkaline Phosphatase: 58 U/L (ref 38–126)
Bilirubin, Direct: 0.3 mg/dL — ABNORMAL HIGH (ref 0.0–0.2)
Indirect Bilirubin: 1 mg/dL — ABNORMAL HIGH (ref 0.3–0.9)
Total Bilirubin: 1.3 mg/dL — ABNORMAL HIGH (ref 0.0–1.2)
Total Protein: 6.6 g/dL (ref 6.5–8.1)

## 2024-02-14 LAB — I-STAT CG4 LACTIC ACID, ED
Lactic Acid, Venous: 4.9 mmol/L (ref 0.5–1.9)
Lactic Acid, Venous: 2.1 mmol/L (ref 0.5–1.9)

## 2024-02-14 LAB — CBG MONITORING, ED: Glucose-Capillary: 254 mg/dL — ABNORMAL HIGH (ref 70–99)

## 2024-02-14 LAB — BASIC METABOLIC PANEL WITH GFR
Anion gap: 16 — ABNORMAL HIGH (ref 5–15)
BUN: 15 mg/dL (ref 8–23)
CO2: 20 mmol/L — ABNORMAL LOW (ref 22–32)
Calcium: 9.5 mg/dL (ref 8.9–10.3)
Chloride: 104 mmol/L (ref 98–111)
Creatinine, Ser: 1.63 mg/dL — ABNORMAL HIGH (ref 0.44–1.00)
GFR, Estimated: 35 mL/min — ABNORMAL LOW (ref >60)
Glucose, Bld: 248 mg/dL — ABNORMAL HIGH (ref 70–99)
Potassium: 2.9 mmol/L — ABNORMAL LOW (ref 3.5–5.1)
Sodium: 140 mmol/L (ref 135–145)

## 2024-02-14 LAB — RESP PANEL BY RT-PCR (RSV, FLU A&B, COVID)  RVPGX2
Influenza A by PCR: NEGATIVE
Influenza B by PCR: NEGATIVE
Resp Syncytial Virus by PCR: NEGATIVE
SARS Coronavirus 2 by RT PCR: NEGATIVE

## 2024-02-14 LAB — PHOSPHORUS: Phosphorus: 3.2 mg/dL (ref 2.5–4.6)

## 2024-02-14 LAB — TSH: TSH: 1.32 u[IU]/mL (ref 0.350–4.500)

## 2024-02-14 LAB — CREATININE, SERUM
Creatinine, Ser: 1.37 mg/dL — ABNORMAL HIGH (ref 0.44–1.00)
GFR, Estimated: 43 mL/min — ABNORMAL LOW (ref >60)

## 2024-02-14 LAB — PROTIME-INR
INR: 1 (ref 0.8–1.2)
Prothrombin Time: 13.8 s (ref 11.4–15.2)

## 2024-02-14 LAB — MAGNESIUM: Magnesium: 1.3 mg/dL — ABNORMAL LOW (ref 1.7–2.4)

## 2024-02-14 LAB — HIV ANTIBODY (ROUTINE TESTING W REFLEX): HIV Screen 4th Generation wRfx: NONREACTIVE

## 2024-02-14 LAB — TROPONIN I (HIGH SENSITIVITY)
Troponin I (High Sensitivity): 52 ng/L — ABNORMAL HIGH (ref <18)
Troponin I (High Sensitivity): 332 ng/L (ref <18)

## 2024-02-14 LAB — LIPASE, BLOOD: Lipase: 30 U/L (ref 11–51)

## 2024-02-14 MED ORDER — BISACODYL 5 MG PO TBEC
5.0000 mg | DELAYED_RELEASE_TABLET | Freq: Every day | ORAL | Status: DC | PRN
  Administered 2024-02-16 – 2024-02-17 (×2): 5 mg via ORAL
  Filled 2024-02-14 (×2): qty 1

## 2024-02-14 MED ORDER — ATORVASTATIN CALCIUM 10 MG PO TABS
20.0000 mg | ORAL_TABLET | Freq: Every day | ORAL | Status: DC
  Administered 2024-02-14 – 2024-02-27 (×14): 20 mg via ORAL
  Filled 2024-02-14 (×14): qty 2

## 2024-02-14 MED ORDER — ALBUTEROL SULFATE (2.5 MG/3ML) 0.083% IN NEBU: 2.5000 mg | INHALATION_SOLUTION | RESPIRATORY_TRACT | Status: DC | PRN

## 2024-02-14 MED ORDER — LACTATED RINGERS IV SOLN: INTRAVENOUS | Status: DC

## 2024-02-14 MED ORDER — LACTATED RINGERS IV BOLUS (SEPSIS)
1714.0000 mL | Freq: Once | INTRAVENOUS | Status: AC
  Administered 2024-02-14: 1714 mL via INTRAVENOUS

## 2024-02-14 MED ORDER — VANCOMYCIN HCL 2000 MG/400ML IV SOLN
2000.0000 mg | Freq: Once | INTRAVENOUS | Status: DC
  Filled 2024-02-14: qty 400

## 2024-02-14 MED ORDER — MAGNESIUM SULFATE 2 GM/50ML IV SOLN
2.0000 g | Freq: Once | INTRAVENOUS | Status: AC
Start: 2024-02-14 — End: 2024-02-14
  Administered 2024-02-14: 2 g via INTRAVENOUS
  Filled 2024-02-14: qty 50

## 2024-02-14 MED ORDER — LACTATED RINGERS IV BOLUS
1000.0000 mL | Freq: Once | INTRAVENOUS | Status: AC
  Administered 2024-02-14: 1000 mL via INTRAVENOUS

## 2024-02-14 MED ORDER — SENNOSIDES-DOCUSATE SODIUM 8.6-50 MG PO TABS: 1.0000 | ORAL_TABLET | Freq: Every evening | ORAL | Status: DC | PRN

## 2024-02-14 MED ORDER — ONDANSETRON HCL 4 MG PO TABS: 4.0000 mg | ORAL_TABLET | Freq: Four times a day (QID) | ORAL | Status: DC | PRN

## 2024-02-14 MED ORDER — PANTOPRAZOLE SODIUM 40 MG PO TBEC
40.0000 mg | DELAYED_RELEASE_TABLET | Freq: Every day | ORAL | Status: DC
  Administered 2024-02-14 – 2024-02-28 (×15): 40 mg via ORAL
  Filled 2024-02-14 (×15): qty 1

## 2024-02-14 MED ORDER — HYDRALAZINE HCL 20 MG/ML IJ SOLN: 10.0000 mg | INTRAMUSCULAR | Status: DC | PRN

## 2024-02-14 MED ORDER — POTASSIUM CHLORIDE CRYS ER 20 MEQ PO TBCR
40.0000 meq | EXTENDED_RELEASE_TABLET | Freq: Once | ORAL | Status: AC
  Administered 2024-02-14: 40 meq via ORAL
  Filled 2024-02-14: qty 2

## 2024-02-14 MED ORDER — OXYCODONE HCL 5 MG PO TABS
5.0000 mg | ORAL_TABLET | ORAL | Status: DC | PRN
  Administered 2024-02-14 – 2024-02-17 (×7): 5 mg via ORAL
  Filled 2024-02-14 (×7): qty 1

## 2024-02-14 MED ORDER — POTASSIUM CHLORIDE 10 MEQ/100ML IV SOLN
10.0000 meq | INTRAVENOUS | Status: AC
  Administered 2024-02-14 (×3): 10 meq via INTRAVENOUS
  Filled 2024-02-14 (×3): qty 100

## 2024-02-14 MED ORDER — NICOTINE 21 MG/24HR TD PT24: 21.0000 mg | MEDICATED_PATCH | Freq: Every day | TRANSDERMAL | Status: DC | PRN

## 2024-02-14 MED ORDER — ACETAMINOPHEN 325 MG PO TABS
650.0000 mg | ORAL_TABLET | Freq: Four times a day (QID) | ORAL | Status: DC | PRN
  Administered 2024-02-16 – 2024-02-25 (×2): 650 mg via ORAL
  Filled 2024-02-14 (×3): qty 2

## 2024-02-14 MED ORDER — FLEET ENEMA RE ENEM: 1.0000 | ENEMA | Freq: Once | RECTAL | Status: DC | PRN

## 2024-02-14 MED ORDER — SODIUM CHLORIDE 0.9 % IV BOLUS
1000.0000 mL | Freq: Once | INTRAVENOUS | Status: AC
  Administered 2024-02-14: 1000 mL via INTRAVENOUS

## 2024-02-14 MED ORDER — ACETAMINOPHEN 650 MG RE SUPP: 650.0000 mg | Freq: Four times a day (QID) | RECTAL | Status: DC | PRN

## 2024-02-14 MED ORDER — HEPARIN BOLUS VIA INFUSION
4000.0000 [IU] | Freq: Once | INTRAVENOUS | Status: DC
  Filled 2024-02-14: qty 4000

## 2024-02-14 MED ORDER — SODIUM CHLORIDE 0.9 % IV BOLUS
500.0000 mL | Freq: Once | INTRAVENOUS | Status: AC
  Administered 2024-02-14: 500 mL via INTRAVENOUS

## 2024-02-14 MED ORDER — FERROUS GLUCONATE 324 (38 FE) MG PO TABS
324.0000 mg | ORAL_TABLET | Freq: Every day | ORAL | Status: DC
  Administered 2024-02-15 – 2024-02-19 (×5): 324 mg via ORAL
  Filled 2024-02-14 (×6): qty 1

## 2024-02-14 MED ORDER — ONDANSETRON HCL 4 MG/2ML IJ SOLN
4.0000 mg | Freq: Four times a day (QID) | INTRAMUSCULAR | Status: DC | PRN
  Filled 2024-02-14: qty 2

## 2024-02-14 MED ORDER — FAMOTIDINE IN NACL 20-0.9 MG/50ML-% IV SOLN
20.0000 mg | Freq: Once | INTRAVENOUS | Status: AC
  Administered 2024-02-14: 20 mg via INTRAVENOUS
  Filled 2024-02-14: qty 50

## 2024-02-14 MED ORDER — VANCOMYCIN HCL IN DEXTROSE 1-5 GM/200ML-% IV SOLN: 1000.0000 mg | Freq: Once | INTRAVENOUS | Status: DC

## 2024-02-14 MED ORDER — GLUCAGON HCL RDNA (DIAGNOSTIC) 1 MG IJ SOLR: 1.0000 mg | INTRAMUSCULAR | Status: DC | PRN

## 2024-02-14 MED ORDER — HEPARIN SODIUM (PORCINE) 5000 UNIT/ML IJ SOLN
5000.0000 [IU] | Freq: Three times a day (TID) | INTRAMUSCULAR | Status: DC
  Administered 2024-02-14: 5000 [IU] via SUBCUTANEOUS
  Filled 2024-02-14: qty 1

## 2024-02-14 MED ORDER — VITAMIN D 25 MCG (1000 UNIT) PO TABS
1000.0000 [IU] | ORAL_TABLET | Freq: Two times a day (BID) | ORAL | Status: DC
  Administered 2024-02-14 – 2024-02-28 (×28): 1000 [IU] via ORAL
  Filled 2024-02-14 (×28): qty 1

## 2024-02-14 MED ORDER — PREGABALIN 100 MG PO CAPS
200.0000 mg | ORAL_CAPSULE | Freq: Every day | ORAL | Status: DC
Start: 2024-02-14 — End: 2024-02-14
  Administered 2024-02-14: 200 mg via ORAL
  Filled 2024-02-14: qty 2

## 2024-02-14 MED ORDER — HYDROMORPHONE HCL 1 MG/ML IJ SOLN
0.5000 mg | INTRAMUSCULAR | Status: DC | PRN
  Administered 2024-02-14 – 2024-02-16 (×4): 1 mg via INTRAVENOUS
  Filled 2024-02-14 (×4): qty 1

## 2024-02-14 MED ORDER — SODIUM CHLORIDE 0.9 % IV SOLN: INTRAVENOUS | Status: DC

## 2024-02-14 MED ORDER — HEPARIN (PORCINE) 25000 UT/250ML-% IV SOLN
900.0000 [IU]/h | INTRAVENOUS | Status: DC
  Administered 2024-02-14: 900 [IU]/h via INTRAVENOUS
  Filled 2024-02-14: qty 250

## 2024-02-14 MED ORDER — METRONIDAZOLE 500 MG/100ML IV SOLN
500.0000 mg | Freq: Once | INTRAVENOUS | Status: AC
  Administered 2024-02-14: 500 mg via INTRAVENOUS
  Filled 2024-02-14: qty 100

## 2024-02-14 MED ORDER — SODIUM CHLORIDE 0.9 % IV SOLN
2.0000 g | Freq: Once | INTRAVENOUS | Status: AC
  Administered 2024-02-14: 2 g via INTRAVENOUS
  Filled 2024-02-14: qty 12.5

## 2024-02-14 MED ORDER — SERTRALINE HCL 50 MG PO TABS
50.0000 mg | ORAL_TABLET | Freq: Every day | ORAL | Status: DC
  Administered 2024-02-14 – 2024-02-28 (×15): 50 mg via ORAL
  Filled 2024-02-14 (×15): qty 1

## 2024-02-14 MED ORDER — ATORVASTATIN CALCIUM 10 MG PO TABS
20.0000 mg | ORAL_TABLET | Freq: Every day | ORAL | Status: DC
Start: 2024-02-14 — End: 2024-02-14
  Administered 2024-02-14: 20 mg via ORAL
  Filled 2024-02-14: qty 2

## 2024-02-14 MED ORDER — PREGABALIN 100 MG PO CAPS
200.0000 mg | ORAL_CAPSULE | Freq: Three times a day (TID) | ORAL | Status: DC
Start: 2024-02-14 — End: 2024-02-28
  Administered 2024-02-14 – 2024-02-28 (×41): 200 mg via ORAL
  Filled 2024-02-14 (×20): qty 2
  Filled 2024-02-14: qty 8
  Filled 2024-02-14 (×20): qty 2

## 2024-02-14 MED ORDER — NOREPINEPHRINE 4 MG/250ML-% IV SOLN
0.0000 ug/min | INTRAVENOUS | Status: DC
  Administered 2024-02-14: 2 ug/min via INTRAVENOUS
  Filled 2024-02-14: qty 250

## 2024-02-14 NOTE — Progress Notes (Signed)
   02/14/24 2003  BiPAP/CPAP/SIPAP  $ Non-Invasive Home Ventilator  Initial  BiPAP/CPAP/SIPAP Pt Type Adult  BiPAP/CPAP/SIPAP  (home unit)  Mask Type Nasal pillows  Dentures removed? Not applicable  FiO2 (%) 36 %  Flow Rate 4 lpm  Patient Home Machine Yes  Safety Check Completed by RT for Home Unit Yes, no issues noted  Patient Home Mask Yes  Patient Home Tubing Yes  Device Plugged into RED Power Outlet Yes  BiPAP/CPAP /SiPAP Vitals  Pulse Rate (!) 115  Resp (!) 26  MEWS Score/Color  MEWS Score 5  MEWS Score Color Red

## 2024-02-14 NOTE — Progress Notes (Signed)
 EEG complete, results are pending.

## 2024-02-14 NOTE — H&P (Signed)
 History and Physical    Breanna Riley FMW:[id number] DOB: 01/21/1961 DOA: 02/14/2024  PCP: Seabron Lenis, MD Patient coming from: Home  Chief Complaint: Syncope  HPI: Breanna Riley is a 63 y.o. female with medical history significant of OSA, HLD, obesity comes to the hospital for evaluation of syncope.  Patient had an episode of lightheadedness 2 days ago with change in position but since then she has experienced this multiple times and on .  Had 2 episodes of syncope each lasting less than 1 minute.  She also noted that one of the time she vomited.  Denies any chest pain or shaking.  Denies any previous similar episodes.  Patient reports that she has had about 30 pounds of intentional weight loss with the help of wellness clinic over the last 9 months and she was started on HCTZ 25 mg daily few days ago due to lower extremity swelling.  While in EMS patient had 3 further episodes lasting for few seconds.  Patient was noted to be incontinent and hypotensive at that time.  No fluid was available therefore briefly received epinephrine.  She was lethargic upon arrival but responsive.  Initial CT head negative.  Lab work revealed hypokalemia, AKI, lactic acidosis, elevated troponin.  Started on IV fluids, broad-spectrum antibiotics were given.   Review of Systems: As per HPI otherwise 10 point review of systems negative.  Review of Systems Otherwise negative except as per HPI, including: General: Denies fever, chills, night sweats or unintended weight loss. Resp: Denies cough, wheezing, shortness of breath. Cardiac: Denies chest pain, palpitations, orthopnea, paroxysmal nocturnal dyspnea. GI: Denies abdominal pain, nausea, vomiting, diarrhea or constipation GU: Denies dysuria, frequency, hesitancy or incontinence MS: Denies muscle aches, joint pain or swelling Neuro: Denies headache, neurologic deficits (focal weakness, numbness, tingling), abnormal gait Psych: Denies anxiety, depression,  SI/HI/AVH Skin: Denies new rashes or lesions ID: Denies sick contacts, exotic exposures, travel  Past Medical History:  Diagnosis Date   Hyperlipidemia    Knee pain    Overweight    Reflux     No past surgical history on file.  SOCIAL HISTORY:  reports that she has never smoked. She does not have any smokeless tobacco history on file. No history on file for alcohol use and drug use.  Allergies  Allergen Reactions   Naproxen Other (See Comments)    Nose bleeding    FAMILY HISTORY: No family history on file.   Prior to Admission medications   Medication Sig Start Date End Date Taking? Authorizing Provider  ascorbic acid (VITAMIN C) 100 MG tablet Take 1 tablet by mouth daily. 12/03/19   [provider]  cholecalciferol (VITAMIN D) 1000 UNITS tablet Take 1,000 Units by mouth 2 (two) times daily.    [provider]  cyanocobalamin 1000 MCG tablet Take by mouth. 12/03/19   [provider]  Cyclobenzaprine HCl (FLEXERIL PO) Take 20 mg by mouth.    [provider]  esomeprazole (NEXIUM) 40 MG capsule Take 40 mg by mouth daily at 12 noon.    [provider]  esomeprazole (NEXIUM) 40 MG capsule Take by mouth.    [provider]  ferrous gluconate (FERGON) 324 MG tablet Take by mouth. 12/03/19   [provider]  fluticasone (FLONASE) 50 MCG/ACT nasal spray Place into both nostrils daily.    [provider]  ibuprofen (ADVIL,MOTRIN) 200 MG tablet Take 200 mg by mouth every 6 (six) hours as needed.    [provider]  Liraglutide -  Weight Management (SAXENDA [place]) Inject 30 mg into the skin daily.    [provider]  loratadine (CLARITIN) 10 MG tablet Take 10 mg by mouth daily. Patient not taking: Reported on 07/31/2020    [provider]  meloxicam (MOBIC) 15 MG tablet Take 15 mg by mouth daily. Patient not taking: Reported on 07/31/2020    [provider]  methocarbamol (ROBAXIN) 500 MG  tablet Take by mouth. 11/15/19   [provider]  pregabalin (LYRICA) 150 MG capsule Take 150 mg by mouth 2 (two) times daily.    [provider]  traMADol (ULTRAM) 50 MG tablet TAKE 1 TABLET BY MOUTH 1 TO 2 TIMES EVERY DAY AS NEEDED 12/21/19   [provider]  triamcinolone cream (KENALOG) 0.5 % Apply 1 application topically 3 (three) times daily.    [provider]    Physical Exam: Vitals:   02/14/24 1240 02/14/24 1245 02/14/24 1250 02/14/24 1255  BP: 93/64 100/66 (!) 89/69 95/65  Pulse:      Resp: 13 18 18 13   Temp:      TempSrc:      SpO2:      Weight:      Height:          Constitutional: NAD, calm, comfortable Eyes: PERRL, lids and conjunctivae normal ENMT: Mucous membranes are moist. Posterior pharynx clear of any exudate or lesions.Normal dentition.  Neck: normal, supple, no masses, no thyromegaly Respiratory: clear to auscultation bilaterally, no wheezing, no crackles. Normal respiratory effort. No accessory muscle use.  Cardiovascular: Regular rate and rhythm, no murmurs / rubs / gallops. No extremity edema. 2+ pedal pulses. No carotid bruits.  Abdomen: no tenderness, no masses palpated. No hepatosplenomegaly. Bowel sounds positive.  Musculoskeletal: no clubbing / cyanosis. No joint deformity upper and lower extremities. Good ROM, no contractures. Normal muscle tone.  Skin: no rashes, lesions, ulcers. No induration Neurologic: CN 2-12 grossly intact. Sensation intact, DTR normal. Strength 5/5 in all 4.  Psychiatric: Normal judgment and insight. Alert and oriented x 3. Normal mood.    Body mass index is 55.14 kg/m.      Labs on Admission: I have personally reviewed following labs and imaging studies  CBC: Recent Labs  Lab 02/14/24 1132 02/14/24 1147  WBC 11.6*  --   NEUTROABS 8.8*  --   HGB 11.0* 11.6*  HCT 35.0* 34.0*  MCV 89.3  --   PLT 295  --    Basic Metabolic Panel: Recent Labs  Lab 02/14/24 1132 02/14/24 1147   NA 140 141  K 2.9* 2.8*  CL 104 104  CO2 20*  --   GLUCOSE 248* 247*  BUN 15 17  CREATININE 1.63* 1.60*  CALCIUM 9.5  --    GFR: Estimated Creatinine Clearance: 41.7 mL/min (A) (by C-G formula based on SCr of 1.6 mg/dL (H)). Liver Function Tests: Recent Labs  Lab 02/14/24 1132  AST 57*  ALT 35  ALKPHOS 58  BILITOT 1.3*  PROT 6.6  ALBUMIN 3.2*   No results for input(s): LIPASE, AMYLASE in the last 168 hours. No results for input(s): AMMONIA in the last 168 hours. Coagulation Profile: Recent Labs  Lab 02/14/24 1132  INR 1.0   Cardiac Enzymes: No results for input(s): CKTOTAL, CKMB, CKMBINDEX, TROPONINI in the last 168 hours. BNP (last 3 results) No results for input(s): PROBNP in the last 8760 hours. HbA1C: No results for input(s): HGBA1C in the last 72 hours. CBG: Recent Labs  Lab 02/14/24 1115  GLUCAP 254*   Lipid Profile: No results for input(s): CHOL, HDL, LDLCALC, TRIG, CHOLHDL, LDLDIRECT in the last 72 hours. Thyroid  Function Tests: No results for input(s): TSH, T4TOTAL, FREET4, T3FREE, THYROIDAB in the last 72 hours. Anemia Panel: No results for input(s): VITAMINB12, FOLATE, FERRITIN, TIBC, IRON, RETICCTPCT in the last 72 hours. Urine analysis: No results found for: COLORURINE, APPEARANCEUR, LABSPEC, PHURINE, GLUCOSEU, HGBUR, BILIRUBINUR, KETONESUR, PROTEINUR, UROBILINOGEN, NITRITE, LEUKOCYTESUR Sepsis Labs: !!!!!!!!!!!!!!!!!!!!!!!!!!!!!!!!!!!!!!!!!!!! @LABRCNTIP (procalcitonin:4,lacticidven:4) ) Recent Results (from the past 240 hours)  Resp panel by RT-PCR (RSV, Flu A&B, Covid) Anterior Nasal Swab     Status: None   Collection Time: 02/14/24 11:32 AM   Specimen: Anterior Nasal Swab  Result Value Ref Range Status   SARS Coronavirus 2 by RT PCR NEGATIVE NEGATIVE Final   Influenza A by PCR NEGATIVE NEGATIVE Final   Influenza B by PCR NEGATIVE NEGATIVE Final    Comment:  (NOTE) The Xpert Xpress SARS-CoV-2/FLU/RSV plus assay is intended as an aid in the diagnosis of influenza from Nasopharyngeal swab specimens and should not be used as a sole basis for treatment. Nasal washings and aspirates are unacceptable for Xpert Xpress SARS-CoV-2/FLU/RSV testing.  Fact Sheet for Patients: BloggerCourse.com  Fact Sheet for Healthcare Providers: SeriousBroker.it  This test is not yet approved or cleared by the [place]  FDA and has been authorized for detection and/or diagnosis of SARS-CoV-2 by FDA under an Emergency Use Authorization (EUA). This EUA will remain in effect (meaning this test can be used) for the duration of the COVID-19 declaration under Section 564(b)(1) of the Act, 21 U.S.C. section 360bbb-3(b)(1), unless the authorization is terminated or revoked.     Resp Syncytial Virus by PCR NEGATIVE NEGATIVE Final    Comment: (NOTE) Fact Sheet for Patients: BloggerCourse.com  Fact Sheet for Healthcare Providers: SeriousBroker.it  This test is not yet approved or cleared by the [place]  FDA and has been authorized for detection and/or diagnosis of SARS-CoV-2 by FDA under an Emergency Use Authorization (EUA). This EUA will remain in effect (meaning this test can be used) for the duration of the COVID-19 declaration under Section 564(b)(1) of the Act, 21 U.S.C. section 360bbb-3(b)(1), unless the authorization is terminated or revoked.  Performed at [facility], [address], [place], [place] [address]      Radiological Exams on Admission: CT Head Wo Contrast Result Date: 02/14/2024 CLINICAL DATA:  Fall, altered mental status EXAM: CT HEAD WITHOUT CONTRAST TECHNIQUE: Contiguous axial images were obtained from the base of the skull through the vertex without intravenous contrast. RADIATION DOSE REDUCTION: This exam was performed  according to the departmental dose-optimization program which includes automated exposure control, adjustment of the mA and/or kV according to patient size and/or use of iterative reconstruction technique. COMPARISON:  None Available. FINDINGS: Brain: Gray-white matter differentiation is preserved. Nonspecific mild periventricular and subcortical white matter hypodensities, likely secondary to chronic microvascular disease. No acute intracranial hemorrhage. No hydrocephalus. No midline shift. Basal cisterns are patent. Vascular: Unremarkable. Skull: No acute findings. Sinuses/Orbits: No acute finding. Other: None. IMPRESSION: No acute intracranial pathology. Electronically Signed   By: Michaeline Blanch M.D.   On: 02/14/2024 12:04   DG Chest Port 1 View Result Date: 02/14/2024 CLINICAL DATA:  Hypotensive, syncope, fall EXAM: PORTABLE CHEST 1 VIEW COMPARISON:  December 14, 2020 FINDINGS: Low lung volumes and slight patient rotation, limiting evaluation. No definite consolidations. No pleural effusions. No pneumothorax. Prominent cardiomediastinal silhouette, likely exaggerated by low lung volumes and AP technique. No acute osseous  findings. IMPRESSION: Limited examination.  Hypoinflation.  No definite acute findings. Electronically Signed   By: Michaeline Blanch M.D.   On: 02/14/2024 12:01    Nutritional status  All images have been reviewed by me personally.  EKG: Independently reviewed.  Nonspecific T wave changes  Assessment/Plan Principal Problem:   Syncope Active Problems:   Syncope and collapse    Syncope -Broad differential but could also be related to hydrochlorothiazide.  will need to rule out cardiac and neurologic causes.  Clinically appears to be dehydrated.  Getting IV fluids.  EKG shows some T wave inversion with first set of troponin being elevated therefore we will continue to trend this.  Monitor urine output, check echocardiogram, TSH, repeat EKG, EEG, carotid Dopplers.  PT/OT, orthostatics.   Depending on her clinical course, may benefit from ambulatory telemetry at discharge.  Acute kidney injury - Baseline creatinine around 1.0, admission creatinine 1.6.  Continue IV fluids.  Hypokalemia - Aggressive repletion.  Check magnesium and phosphorus levels as well.  Ionized calcium appears to be okay.  SIRS/lactic acidosis - Suspect from dehydration.  No further need for antibiotics.  UA ordered, advised RN staff to straight cath.  Blood culture sent.  Received 1 dose of antibiotics in the ER, will hold off on it due to low suspicion of infection.  Elevated troponin - First set elevated.  Repeat lab work has been ordered.  EKG showing nonspecific T wave inversion.  Will repeat EKG as well.  GERD - PPI  Anemia/iron deficiency - Iron supplements with bowel regimen as needed  Depression - On Zoloft, pregabalin.   Obesity - Follows outpatient wellness clinic.  Has had about 30 pound weight loss in the last 9 months    DVT prophylaxis: Subcu heparin Code Status: Full code Family Communication:  Consults called: None Admission status: Telemetry admission  Status is: Inpatient Remains inpatient appropriate because: Admit to telemetry for multiple medical issues mentioned above   Time Spent: 65 minutes.  >50% of the time was devoted to discussing the patients care, assessment, plan and disposition with other care givers along with counseling the patient about the risks and benefits of treatment.    Burgess JAYSON Dare MD [facility]  If 7PM-7AM, please contact night-coverage   02/14/2024, 2:31 PM

## 2024-02-14 NOTE — Significant Event (Signed)
 Rapid Response Event Note   Reason for Call :  Pt seen during rounds. SBP-70s, SpO2-88-90% on CPAP with 4L oxygen bled in.  Initial Focused Assessment:  Pt resting in bed on CPAP. Her breathing is unlabored. She awakes easily to voice and is oriented and able to answer questions/follow commands. Lungs clear/diminished. Skin cold/clammy/diaphoretic.   T-97.9, HR-106, BP-73/50, RR-18, SpO2-91% on CPAP/4L  Interventions:  CBG-127 Levophed gtt started IV team consulted for additional PIV PCCM consulted: Tx to ICU Plan of Care:  Tx to 2H13.    Event Summary:   MD Notified: Dr. Franky notified and came to bedside. PCCM consulted: Dr. Maree to bedside. Call Time:2230(patient seen on rounds) Arrival Time:2230 End Time:  Tish Graeme Piety, RN

## 2024-02-14 NOTE — ED Notes (Signed)
 CCMD called and patient verified on cardiac telemetry

## 2024-02-14 NOTE — Progress Notes (Signed)
 RN attempted orthostatic. Ptn became unresponsive for a very brief period with Low BP. She is alert again. Will order NS bolus 1L and, place her on bed rest. Rest of the work up as planned.

## 2024-02-14 NOTE — Procedures (Signed)
 Patient Name: Breanna Riley  MRN: [id number]  Epilepsy Attending: Arlin MALVA Krebs  Referring Physician/Provider: Caleen Burgess BROCKS, MD  Date: 02/14/2024 Duration: 21.57 mins  Patient history: 63yo F with syncope. EEG to evaluate for seizure  Level of alertness: Awake  AEDs during EEG study: PGB  Technical aspects: This EEG study was done with scalp electrodes positioned according to the 10-20 International system of electrode placement. Electrical activity was reviewed with band pass filter of 1-70Hz , sensitivity of 7 uV/mm, display speed of 21mm/sec with a 60Hz  notched filter applied as appropriate. EEG data were recorded continuously and digitally stored.  Video monitoring was available and reviewed as appropriate.  Description: The posterior dominant rhythm consists of 8-9 Hz activity of moderate voltage (25-35 uV) seen predominantly in posterior head regions, symmetric and reactive to eye opening and eye closing. Hyperventilation and photic stimulation were not performed.     IMPRESSION: This study is within normal limits. No seizures or epileptiform discharges were seen throughout the recording.  A normal interictal EEG does not exclude the diagnosis of epilepsy.   Breanna Riley

## 2024-02-14 NOTE — ED Provider Notes (Signed)
 [place] EMERGENCY DEPARTMENT AT [facility] Provider Note   CSN: [id number] Arrival date & time: 02/14/24  1109     Patient presents with: No chief complaint on file.   Breanna Riley is a 63 y.o. female.   63 year old female presents for evaluation of syncopal episodes.  Per EMS report she was found unresponsive at home.  She seemed to have multiple syncopal episodes for them on the way here in which she becomes somewhat unresponsive but will regain consciousness without intervention.  They noted her to be tachycardic and hypotensive.  Patient is lethargic but awake and alert and does answer questions appropriately.  States she has been feeling weak since Saturday.  She does states she has been eating and drinking but not as much as usual.  Denies any pain or vomiting or diarrhea she states she feels very fatigued.        Prior to Admission medications   Medication Sig Start Date End Date Taking? Authorizing Provider  atorvastatin (LIPITOR) 20 MG tablet Take 20 mg by mouth daily.   Yes [provider]  cholecalciferol (VITAMIN D) 1000 UNITS tablet Take 1,000 Units by mouth 2 (two) times daily.   Yes [provider]  Cyclobenzaprine HCl (FLEXERIL PO) Take 20 mg by mouth daily as needed (muscle spasms).   Yes [provider]  esomeprazole (NEXIUM) 40 MG capsule Take 40 mg by mouth daily at 12 noon.   Yes [provider]  esomeprazole (NEXIUM) 40 MG capsule Take 40 mg by mouth daily.   Yes [provider]  fluticasone (FLONASE) 50 MCG/ACT nasal spray Place 1 spray into both nostrils daily.   Yes [provider]  hydrochlorothiazide (HYDRODIURIL) 25 MG tablet Take 25 mg by mouth daily. 11/02/23  Yes [provider]  meloxicam (MOBIC) 15 MG tablet Take 15 mg by mouth daily.   Yes [provider]  pregabalin (LYRICA) 150 MG capsule Take 250 mg by mouth in the morning, at noon, and at bedtime.   Yes [provider]  sertraline (ZOLOFT) 50 MG tablet Take 50 mg by mouth daily.   Yes [provider]  traMADol (ULTRAM) 50 MG tablet Take 50 mg by mouth every 12 (twelve) hours as needed for moderate pain (pain score 4-6) or severe pain (pain score 7-10). 12/21/19  Yes [provider]  triamcinolone cream (KENALOG) 0.5 % Apply 1 application topically 3 (three) times daily.   Yes [provider]  ascorbic acid (VITAMIN C) 100 MG tablet Take 1 tablet by mouth daily. Patient not taking: Reported on 02/14/2024 12/03/19   [provider]  cyanocobalamin 1000 MCG tablet Take by mouth. Patient not taking: Reported on 02/14/2024 12/03/19   [provider]  ferrous gluconate (FERGON) 324 MG tablet Take by mouth. Patient not taking: Reported on 02/14/2024 12/03/19   [provider]  ibuprofen (ADVIL,MOTRIN) 200 MG tablet Take 200 mg by mouth every 6 (six) hours as needed. Patient not taking: Reported on 02/14/2024    [provider]  Liraglutide -Weight Management (SAXENDA Barnett) Inject 30 mg into the skin daily. Patient not taking: Reported on 02/14/2024    [provider]  loratadine (CLARITIN) 10 MG tablet Take 10 mg by mouth daily. Patient not taking: Reported on 07/31/2020    [provider]  methocarbamol (ROBAXIN) 500 MG tablet Take by mouth. Patient not taking: Reported on 02/14/2024 11/15/19   [provider]    Allergies: Naproxen  Review of Systems  Constitutional:  Positive for fatigue. Negative for chills and fever.  HENT:  Negative for ear pain and sore throat.   Eyes:  Negative for pain and visual disturbance.  Respiratory:  Negative for cough and shortness of breath.   Cardiovascular:  Negative for chest pain and palpitations.  Gastrointestinal:  Negative for abdominal pain and vomiting.  Genitourinary:  Negative for dysuria and hematuria.  Musculoskeletal:  Negative for arthralgias and back pain.  Skin:   Negative for color change and rash.  Neurological:  Positive for dizziness and syncope. Negative for seizures.  All other systems reviewed and are negative.   Updated Vital Signs BP 101/72   Pulse (!) 122   Temp 97.7 F (36.5 C) (Oral)   Resp (!) 25   Ht 4' 11 (1.499 m)   Wt 123.8 kg   SpO2 94%   BMI 55.14 kg/m   Physical Exam Vitals and nursing note reviewed.  Constitutional:      General: She is not in acute distress.    Appearance: Normal appearance. She is well-developed. She is ill-appearing.  HENT:     Head: Normocephalic and atraumatic.     Mouth/Throat:     Mouth: Mucous membranes are dry.  Eyes:     Conjunctiva/sclera: Conjunctivae normal.  Cardiovascular:     Rate and Rhythm: Regular rhythm. Tachycardia present.     Heart sounds: No murmur heard. Pulmonary:     Effort: Pulmonary effort is normal. No respiratory distress.     Breath sounds: Normal breath sounds. No stridor. No wheezing or rhonchi.  Abdominal:     Palpations: Abdomen is soft.     Tenderness: There is no abdominal tenderness.  Musculoskeletal:        General: No swelling.     Cervical back: Neck supple.  Skin:    General: Skin is warm and dry.     Capillary Refill: Capillary refill takes less than 2 seconds.  Neurological:     General: No focal deficit present.     Mental Status: She is alert.  Psychiatric:        Mood and Affect: Mood normal.     (all labs ordered are listed, but only abnormal results are displayed) Labs Reviewed  BASIC METABOLIC PANEL WITH GFR - Abnormal; Notable for the following components:      Result Value   Potassium 2.9 (*)    CO2 20 (*)    Glucose, Bld 248 (*)    Creatinine, Ser 1.63 (*)    GFR, Estimated 35 (*)    Anion gap 16 (*)    All other components within normal limits  HEPATIC FUNCTION PANEL - Abnormal; Notable for the following components:   Albumin 3.2 (*)    AST 57 (*)    Total Bilirubin 1.3 (*)    Bilirubin, Direct 0.3 (*)    Indirect  Bilirubin 1.0 (*)    All other components within normal limits  CBC WITH DIFFERENTIAL/PLATELET - Abnormal; Notable for the following components:   WBC 11.6 (*)    Hemoglobin 11.0 (*)    HCT 35.0 (*)    Neutro Abs 8.8 (*)    All other components within normal limits  URINALYSIS, W/ REFLEX TO CULTURE (INFECTION SUSPECTED) - Abnormal; Notable for the following components:   Protein, ur 100 (*)    Bacteria, UA FEW (*)    All other components within normal limits  CBG MONITORING, ED - Abnormal; Notable for the following  components:   Glucose-Capillary 254 (*)    All other components within normal limits  I-STAT CHEM 8, ED - Abnormal; Notable for the following components:   Potassium 2.8 (*)    Creatinine, Ser 1.60 (*)    Glucose, Bld 247 (*)    Hemoglobin 11.6 (*)    HCT 34.0 (*)    All other components within normal limits  I-STAT CG4 LACTIC ACID, ED - Abnormal; Notable for the following components:   Lactic Acid, Venous 4.9 (*)    All other components within normal limits  TROPONIN I (HIGH SENSITIVITY) - Abnormal; Notable for the following components:   Troponin I (High Sensitivity) 52 (*)    All other components within normal limits  RESP PANEL BY RT-PCR (RSV, FLU A&B, COVID)  RVPGX2  CULTURE, BLOOD (ROUTINE X 2)  CULTURE, BLOOD (ROUTINE X 2)  PROTIME-INR  LIPASE, BLOOD  HIV ANTIBODY (ROUTINE TESTING W REFLEX)  CBC  CREATININE, SERUM  MAGNESIUM  PHOSPHORUS  TSH  PROCALCITONIN  I-STAT CG4 LACTIC ACID, ED  I-STAT CG4 LACTIC ACID, ED  TROPONIN I (HIGH SENSITIVITY)    EKG: EKG Interpretation Date/Time:  Tuesday February 14 2024 11:17:11 EDT Ventricular Rate:  104 PR Interval:  158 QRS Duration:  102 QT Interval:  367 QTC Calculation: 483 R Axis:   47  Text Interpretation: Sinus tachycardia  T wave inversions in the anterior and lateral leads No previous for comparison Confirmed by Gennaro Bouchard ([id number]) on 02/14/2024 11:26:21 AM  Radiology: CT Head Wo  Contrast Result Date: 02/14/2024 CLINICAL DATA:  Fall, altered mental status EXAM: CT HEAD WITHOUT CONTRAST TECHNIQUE: Contiguous axial images were obtained from the base of the skull through the vertex without intravenous contrast. RADIATION DOSE REDUCTION: This exam was performed according to the departmental dose-optimization program which includes automated exposure control, adjustment of the mA and/or kV according to patient size and/or use of iterative reconstruction technique. COMPARISON:  None Available. FINDINGS: Brain: Gray-white matter differentiation is preserved. Nonspecific mild periventricular and subcortical white matter hypodensities, likely secondary to chronic microvascular disease. No acute intracranial hemorrhage. No hydrocephalus. No midline shift. Basal cisterns are patent. Vascular: Unremarkable. Skull: No acute findings. Sinuses/Orbits: No acute finding. Other: None. IMPRESSION: No acute intracranial pathology. Electronically Signed   By: Michaeline Blanch M.D.   On: 02/14/2024 12:04   DG Chest Port 1 View Result Date: 02/14/2024 CLINICAL DATA:  Hypotensive, syncope, fall EXAM: PORTABLE CHEST 1 VIEW COMPARISON:  December 14, 2020 FINDINGS: Low lung volumes and slight patient rotation, limiting evaluation. No definite consolidations. No pleural effusions. No pneumothorax. Prominent cardiomediastinal silhouette, likely exaggerated by low lung volumes and AP technique. No acute osseous findings. IMPRESSION: Limited examination.  Hypoinflation.  No definite acute findings. Electronically Signed   By: Michaeline Blanch M.D.   On: 02/14/2024 12:01     Procedures   Medications Ordered in the ED  lactated ringers bolus 1,714 mL (1,714 mLs Intravenous New Bag/Given 02/14/24 1324)  vancomycin (VANCOREADY) IVPB 2000 mg/400 mL (has no administration in time range)  potassium chloride 10 mEq in 100 mL IVPB (10 mEq Intravenous New Bag/Given 02/14/24 1329)  famotidine (PEPCID) IVPB 20 mg premix (has no  administration in time range)  acetaminophen (TYLENOL) tablet 650 mg (has no administration in time range)    Or  acetaminophen (TYLENOL) suppository 650 mg (has no administration in time range)  oxyCODONE (Oxy IR/ROXICODONE) immediate release tablet 5 mg (has no administration in time range)  HYDROmorphone (DILAUDID) injection 0.5-1 mg (  has no administration in time range)  senna-docusate (Senokot-S) tablet 1 tablet (has no administration in time range)  bisacodyl (DULCOLAX) EC tablet 5 mg (has no administration in time range)  sodium phosphate (FLEET) enema 1 enema (has no administration in time range)  ondansetron  (ZOFRAN ) tablet 4 mg (has no administration in time range)    Or  ondansetron  (ZOFRAN ) injection 4 mg (has no administration in time range)  albuterol (PROVENTIL) (2.5 MG/3ML) 0.083% nebulizer solution 2.5 mg (has no administration in time range)  nicotine (NICODERM CQ - dosed in mg/24 hours) patch 21 mg (has no administration in time range)  heparin injection 5,000 Units (has no administration in time range)  glucagon (human recombinant) (GLUCAGEN) injection 1 mg (has no administration in time range)  hydrALAZINE (APRESOLINE) injection 10 mg (has no administration in time range)  0.9 %  sodium chloride infusion (has no administration in time range)  pantoprazole (PROTONIX) EC tablet 40 mg (has no administration in time range)  atorvastatin (LIPITOR) tablet 20 mg (has no administration in time range)  cholecalciferol (VITAMIN D3) 25 MCG (1000 UNIT) tablet 1,000 Units (has no administration in time range)  ferrous gluconate (FERGON) tablet 324 mg (has no administration in time range)  pregabalin (LYRICA) capsule 200 mg (has no administration in time range)  sertraline (ZOLOFT) tablet 50 mg (has no administration in time range)  lactated ringers bolus 1,000 mL (0 mLs Intravenous Stopped 02/14/24 1201)  lactated ringers bolus 1,000 mL (0 mLs Intravenous Stopped 02/14/24 1256)   ceFEPIme (MAXIPIME) 2 g in sodium chloride 0.9 % 100 mL IVPB (0 g Intravenous Stopped 02/14/24 1256)  metroNIDAZOLE (FLAGYL) IVPB 500 mg (500 mg Intravenous New Bag/Given 02/14/24 1328)  potassium chloride SA (KLOR-CON M) CR tablet 40 mEq (40 mEq Oral Given 02/14/24 1330)                                    Medical Decision Making Cardiac monitor interpretation: Sinus rhythm to sinus tachycardia, no ectopy  Patient here for multiple unresponsive episodes at home like syncope.  She has been feeling unwell for the last few days but her only complaint is weakness and lethargy.  Does appear to be dry on exam.  I suspect dehydration and possible sepsis.  She was hypotensive and tachycardic on arrival but this improved with IV fluids.  She was started on epi drip by EMS as they could not bolus her with fluids through the small IV in her hand.  Epi drip was stopped here and blood pressure has been in the 90s to 100s after IV fluids.  Lactate was elevated she did was slightly [place] ptosis.  Urine is pending at this time.  Patient has otherwise no complaints.  Family at bedside states she had some vomiting today as well.  Potassium was replaced both p.o. and IV.  Troponin is elevated, she has some T wave inversions on her EKG.  We will repeat a second 1.  She has no complaints of shortness of breath or chest pain at this time and no cardiac history.  Discussed patient's case with Dr. Caleen, hospitalist and patient will be admitted to the hospital service for further workup and management.  Patient and family at bedside are agreeable with the plan  Problems Addressed: Dehydration: acute illness or injury that poses a threat to life or bodily functions Hypokalemia: acute illness or injury that poses a threat to  life or bodily functions Sepsis, due to unspecified organism, unspecified whether acute organ dysfunction present [facility]): acute illness or injury that poses a threat to life or bodily functions Syncope,  unspecified syncope type: acute illness or injury that poses a threat to life or bodily functions  Amount and/or Complexity of Data Reviewed Independent Historian: EMS    Details: EMS and family helped provide history as patient was slightly lethargic on arrival.  EMS states she had multiple syncopal episodes and route for them External Data Reviewed: notes.    Details: No prior records for review Labs: ordered. Decision-making details documented in ED Course.    Details: Ordered and reviewed by me.  Patient's first troponin is elevated, she also has a leukocytosis and hypokalemia Radiology: ordered and independent interpretation performed. Decision-making details documented in ED Course.    Details: Ordered and interpreted by me independently and radiology Chest x-ray: Shows no acute abnormality in the chest CT head: Shows no acute intracranial process ECG/medicine tests: ordered and independent interpretation performed. Decision-making details documented in ED Course.    Details: EKG ordered and interpreted by me in the absence of cardiology and shows sinus tachycardia, no STEMI, there is no previous for comparison Discussion of management or test interpretation with external provider(s): Dr. Dia spoke with him on the phone regarding the patient's case and he will accept the patient for further workup and management  Risk OTC drugs. Prescription drug management. Drug therapy requiring intensive monitoring for toxicity. Decision regarding hospitalization. Diagnosis or treatment significantly limited by social determinants of health. Risk Details: CRITICAL CARE Performed by: Duwaine LITTIE Fusi   Total critical care time: 40 minutes  Critical care time was exclusive of separately billable procedures and treating other patients.  Critical care was necessary to treat or prevent imminent or life-threatening deterioration.  Critical care was time spent personally by me on the  following activities: development of treatment plan with patient and/or surrogate as well as nursing, discussions with consultants, evaluation of patient's response to treatment, examination of patient, obtaining history from patient or surrogate, ordering and performing treatments and interventions, ordering and review of laboratory studies, ordering and review of radiographic studies, pulse oximetry and re-evaluation of patient's condition.   Critical Care Total time providing critical care: 40 minutes     Final diagnoses:  Sepsis, due to unspecified organism, unspecified whether acute organ dysfunction present [facility])  Syncope, unspecified syncope type  Dehydration  Hypokalemia    ED Discharge Orders     None          Fusi Duwaine LITTIE, DO 02/14/24 1500

## 2024-02-14 NOTE — Sepsis Progress Note (Signed)
 Notified bedside nurse of need to draw repeat lactic acid.

## 2024-02-14 NOTE — ED Notes (Signed)
 EVS contacted to replace the SHARPS bin.

## 2024-02-14 NOTE — Significant Event (Signed)
 63 year old female who was admitted for syncope is found to be persistently hypotensive with a systolic in the 70s despite getting almost 5 L fluids.  I reviewed patient's notes and labs medications.  Patient had recently had intentional loss of weight and also was on hydrochlorothiazide.  Denies any chest pain or shortness of breath at the time of my exam.  Moving all extremities.  Labs show elevated troponin hemoglobin of 10.6 lactic acid was around 4 repeat 1 was 2.2 after fluids.  Blood pressure is 70/50 pulse is 110s 100 temperature 97.4 respiration 18/min HEENT anicteric no pallor. Chest bilaterally present. Heart S1-S2 heard. Abdomen soft nontender bowel sound present.  Assessment and plan -     Persistent hypotension cause not clear patient as per the nurse has already received 5 L of fluid and at this point planning to start Levophed will check blood cultures repeat troponins repeat labs including metabolic panel CBC.  Discussed with pulmonary critical care.  Redia Cleaver

## 2024-02-14 NOTE — ED Notes (Signed)
 Pt difficult stick. Only able to get 1 set of blood cultures. Dr. Gennaro made aware

## 2024-02-14 NOTE — ED Notes (Signed)
 Pt had a presyncopal episode in bed. This RN walked passed room with family members all fanning pt and she reported being very hot. she reported her vision started to get blurry and her BP dropped to 76/27. BP increased back up to 91/56 and reports feeling much better. this episode lasted only 2 minutes and all symptoms are gone now. MD made aware

## 2024-02-14 NOTE — Sepsis Progress Note (Signed)
 Elink monitoring for the code sepsis protocol.

## 2024-02-14 NOTE — Significant Event (Signed)
 Rapid Response Event Note   Reason for Call :  Syncopal episode following orthostatic VS check  Initial Focused Assessment:  Pt in bed, AO. Extremities cool. Skin pink. No distress. Endorses foot pain while standing for orthostatic VS. Endorses on-going neck pain, she isn't sure if the pain is from her fall at home or the pillow provided in the ED. Denies lightheadedness or dizziness. Does not remember passing out, staff confirms she was briefly unresponsive. Clear breath sounds. Abdomen soft, bowel sounds active.   VS: T 97.28F, BP 86/69 (77), HR 115, RR 13, SpO2 83% on room air  Interventions:  -1L IVF bolus per MD -4LNC, SpO2 93% with supplemental oxygen -CBG  Plan of Care:  -Frequent VS check -Wean oxygen as patient tolerates  Event Summary:  MD Notified: per RN Call Time: 1840 Arrival Time: 1845 End Time: 1900  Leonor LITTIE Danker, RN

## 2024-02-14 NOTE — Consult Note (Signed)
 Cardiology Consultation   Patient ID: Breanna Riley MRN: [id number]; DOB: Jul 22, 1960  Admit date: 02/14/2024 Date of Consult: 02/14/2024  PCP:  Seabron Lenis, MD   [facility] Providers Cardiologist:  None        Patient Profile: Breanna Riley is a 63 y.o. female with a hx of HLD, OSA, morbid obesity, spinal stenosis s/p lumbar fusion, and GERD who is being seen 02/14/2024 for the evaluation of syncope at the request of Dr. Caleen.  History of Present Illness: Breanna Riley reports that on Saturday she began feeling intermittent episodes of lightheadedness/dizziness.  These episodes continued over the course of Sunday ultimately resulting in her having an episode of syncope on Monday.  Earlier today the patient had several episodes of syncope prompting her to call EMS.  These episodes are characterized by vision changes prior to losing consciousness.  She has a prodrome of diaphoresis and nausea.  She estimates that she lost consciousness for several minutes before coming to.  She denies chest pain, SOB, PND, orthopnea, swelling, seizure-like activity, urinary or fecal incontinence, and palpitations.  She has never had these symptoms before.  Over the past 3 weeks the patient endorses poor p.o. intake of both solids and liquids.  She did not elaborate why.  Furthermore, she was started on HCTZ back in June for fluid and blood pressure.   Her family history is notable for father who died of an MI in his 43s.  She denies tobacco and illicit drug use.  Only occasional EtOH use.  She lives in [place] and is accompanied by her son.  In the ED her VS were afebrile, BP 76/26, HR 104-120s, RR 18 and satting 100% on RA. Labs notable for WBC 11.6, Hbg 11, K+ 2.9, Scr 1.63, AST 57, troponins 52 -> 332, and lactic 4.9. ECG demonstrated sinus tachycardia with TWI in V1-V3. CXR showed no acute findings.  CT head with no acute intracranial pathology.  In the ED she was given 2L IVF and started on  broad-spectrum antibiotics.  Cardiology is consulted for the evaluation of syncope.  Past Medical History:  Diagnosis Date   Hyperlipidemia    Knee pain    Overweight    Reflux     No past surgical history on file.   Home Medications:  Prior to Admission medications   Medication Sig Start Date End Date Taking? Authorizing Provider  atorvastatin (LIPITOR) 20 MG tablet Take 20 mg by mouth daily.   Yes [provider]  cholecalciferol (VITAMIN D) 1000 UNITS tablet Take 1,000 Units by mouth 2 (two) times daily.   Yes [provider]  Cyclobenzaprine HCl (FLEXERIL PO) Take 20 mg by mouth daily as needed (muscle spasms).   Yes [provider]  esomeprazole (NEXIUM) 40 MG capsule Take 40 mg by mouth daily at 12 noon.   Yes [provider]  esomeprazole (NEXIUM) 40 MG capsule Take 40 mg by mouth daily.   Yes [provider]  fluticasone (FLONASE) 50 MCG/ACT nasal spray Place 1 spray into both nostrils daily.   Yes [provider]  hydrochlorothiazide (HYDRODIURIL) 25 MG tablet Take 25 mg by mouth daily. 11/02/23  Yes [provider]  meloxicam (MOBIC) 15 MG tablet Take 15 mg by mouth daily.   Yes [provider]  pregabalin (LYRICA) 150 MG capsule Take 250 mg by mouth in the morning, at noon, and at bedtime.   Yes [provider]  sertraline (ZOLOFT) 50 MG tablet Take 50 mg  by mouth daily.   Yes [provider]  traMADol (ULTRAM) 50 MG tablet Take 50 mg by mouth every 12 (twelve) hours as needed for moderate pain (pain score 4-6) or severe pain (pain score 7-10). 12/21/19  Yes [provider]  triamcinolone cream (KENALOG) 0.5 % Apply 1 application topically 3 (three) times daily.   Yes [provider]  ascorbic acid (VITAMIN C) 100 MG tablet Take 1 tablet by mouth daily. Patient not taking: Reported on 02/14/2024 12/03/19   [provider]  cyanocobalamin 1000 MCG tablet Take by  mouth. Patient not taking: Reported on 02/14/2024 12/03/19   [provider]  ferrous gluconate (FERGON) 324 MG tablet Take by mouth. Patient not taking: Reported on 02/14/2024 12/03/19   [provider]  ibuprofen (ADVIL,MOTRIN) 200 MG tablet Take 200 mg by mouth every 6 (six) hours as needed. Patient not taking: Reported on 02/14/2024    [provider]  Liraglutide -Weight Management (SAXENDA [place]) Inject 30 mg into the skin daily. Patient not taking: Reported on 02/14/2024    [provider]  loratadine (CLARITIN) 10 MG tablet Take 10 mg by mouth daily. Patient not taking: Reported on 07/31/2020    [provider]  methocarbamol (ROBAXIN) 500 MG tablet Take by mouth. Patient not taking: Reported on 02/14/2024 11/15/19   [provider]    Scheduled Meds:  atorvastatin  20 mg Oral Daily   cholecalciferol  1,000 Units Oral BID   [START ON 02/15/2024] ferrous gluconate  324 mg Oral Q breakfast   pantoprazole  40 mg Oral QAC breakfast   pregabalin  200 mg Oral Daily   sertraline  50 mg Oral Daily   Continuous Infusions:  sodium chloride 100 mL/hr at 02/14/24 1815   heparin Stopped (02/14/24 1826)   magnesium sulfate bolus IVPB 2 g (02/14/24 1815)   PRN Meds: acetaminophen **OR** acetaminophen, albuterol, bisacodyl, glucagon (human recombinant), hydrALAZINE, HYDROmorphone (DILAUDID) injection, nicotine, ondansetron  **OR** ondansetron  (ZOFRAN ) IV, oxyCODONE, senna-docusate, sodium phosphate  Allergies:    Allergies  Allergen Reactions   Naproxen Other (See Comments)    Nose bleeding    Social History:   Social History   Socioeconomic History   Marital status: Single    Spouse name: Not on file   Number of children: Not on file   Years of education: Not on file   Highest education level: Not on file  Occupational History   Not on file  Tobacco Use   Smoking status: Never   Smokeless tobacco: Not on file  Substance and Sexual  Activity   Alcohol use: Not on file   Drug use: Not on file   Sexual activity: Not on file  Other Topics Concern   Not on file  Social History Narrative   Not on file   Social Drivers of Health   Financial Resource Strain: Low Risk  (09/19/2023)   Received from [facility]   Overall Financial Resource Strain (CARDIA)    Difficulty of Paying Living Expenses: Not hard at all  Food Insecurity: No Food Insecurity (12/12/2023)   Received from [facility]   Hunger Vital Sign    Within the past 12 months, you worried that your food would run out before you got the money to buy more.: Never true    Within the past 12 months, the food you bought just didn't last and you didn't have money to get more.: Never true  Transportation Needs: No Transportation Needs (12/12/2023)  Received from [facility] - Transportation    In the past 12 months, has lack of transportation kept you from medical appointments or from getting medications?: No    In the past 12 months, has lack of transportation kept you from meetings, work, or from getting things needed for daily living?: No  Physical Activity: Not on file  Stress: No Stress Concern Present (12/12/2023)   Received from [facility] - Occupational Stress Questionnaire    Do you feel stress - tense, restless, nervous, or anxious, or unable to sleep at night because your mind is troubled all the time - these days?: Only a little  Social Connections: Unknown (09/28/2021)   Received from [facility]   Social Network    Social Network: Not on file  Intimate Partner Violence: Not At Risk (12/12/2023)   Received from [facility]   HITS    Over the last 12 months how often did your partner physically hurt you?: Never    Over the last 12 months how often did your partner insult you or talk down to you?: Never    Over the last 12 months how often did your partner threaten you with physical harm?:  Never    Over the last 12 months how often did your partner scream or curse at you?: Never    Family History:   Dad -MI in his 68s  ROS:  Please see the history of present illness.   All other ROS reviewed and negative.     Physical Exam/Data: Vitals:   02/14/24 1645 02/14/24 1646 02/14/24 1700 02/14/24 1715  BP: (!) 91/56  95/62 (!) 90/58  Pulse:    (!) 125  Resp: (!) 26 17 15 13   Temp:      TempSrc:      SpO2:    92%  Weight:      Height:        Intake/Output Summary (Last 24 hours) at 02/14/2024 1827 Last data filed at 02/14/2024 1425 Gross per 24 hour  Intake 2500 ml  Output --  Net 2500 ml      02/14/2024   12:01 PM  Last 3 Weights  Weight (lbs) 273 lb  Weight (kg) 123.832 kg     Body mass index is 56.86 kg/m.  General:  Well nourished, well developed, in no acute distress, laying flat in bed HEENT: Atraumatic, normocephalic Neck: no JVD Vascular: No carotid bruits; Distal pulses 2+ bilaterally Cardiac:  normal S1, S2; tachycardic with regular rhythm; no murmur, rubs or gallops Lungs:  clear to auscultation bilaterally, no wheezing, rhonchi or rales  Abd: soft, nontender, no hepatomegaly  Ext: no edema Musculoskeletal:  No gross deformities Skin: warm and dry  Neuro:  CNs 2-12 intact, no focal abnormalities noted Psych:  Normal affect   EKG:  The EKG was personally reviewed and demonstrates: Sinus tachycardia with TWI in the septal precordial leads     Telemetry:  Telemetry was personally reviewed and demonstrates: Sinus tachycardia.  Relevant CV Studies: None  Laboratory Data: High Sensitivity Troponin:   Recent Labs  Lab 02/14/24 1132 02/14/24 1544  TROPONINIHS 52* 332*     Chemistry Recent Labs  Lab 02/14/24 1132 02/14/24 1147 02/14/24 1545  NA 140 141  --   K 2.9* 2.8*  --   CL 104 104  --   CO2 20*  --   --   GLUCOSE 248* 247*  --  BUN 15 17  --   CREATININE 1.63* 1.60* 1.37*  CALCIUM 9.5  --   --   MG  --   --  1.3*   GFRNONAA 35*  --  43*  ANIONGAP 16*  --   --     Recent Labs  Lab Feb 29, 2024 1132  PROT 6.6  ALBUMIN 3.2*  AST 57*  ALT 35  ALKPHOS 58  BILITOT 1.3*   Lipids No results for input(s): CHOL, TRIG, HDL, LABVLDL, LDLCALC, CHOLHDL in the last 168 hours.  Hematology Recent Labs  Lab 02/29/24 1132 02/29/24 1147 02/29/24 1545  WBC 11.6*  --  8.0  RBC 3.92  --  3.77*  HGB 11.0* 11.6* 10.6*  HCT 35.0* 34.0* 33.2*  MCV 89.3  --  88.1  MCH 28.1  --  28.1  MCHC 31.4  --  31.9  RDW 13.5  --  13.5  PLT 295  --  241   Thyroid   Recent Labs  Lab 2024-02-29 1545  TSH 1.320    BNPNo results for input(s): BNP, PROBNP in the last 168 hours.  DDimer No results for input(s): DDIMER in the last 168 hours.  Radiology/Studies:  EEG adult Result Date: 02-29-2024 Shelton Arlin KIDD, MD     02/29/2024  4:52 PM Patient Name: Breanna Riley MRN: [id number] Epilepsy Attending: Arlin KIDD Shelton Referring Physician/Provider: Caleen Burgess BROCKS, MD Date: 29-Feb-2024 Duration: 21.57 mins Patient history: 63yo F with syncope. EEG to evaluate for seizure Level of alertness: Awake AEDs during EEG study: PGB Technical aspects: This EEG study was done with scalp electrodes positioned according to the 10-20 International system of electrode placement. Electrical activity was reviewed with band pass filter of 1-70Hz , sensitivity of 7 uV/mm, display speed of 69mm/sec with a 60Hz  notched filter applied as appropriate. EEG data were recorded continuously and digitally stored.  Video monitoring was available and reviewed as appropriate. Description: The posterior dominant rhythm consists of 8-9 Hz activity of moderate voltage (25-35 uV) seen predominantly in posterior head regions, symmetric and reactive to eye opening and eye closing. Hyperventilation and photic stimulation were not performed.   IMPRESSION: This study is within normal limits. No seizures or epileptiform discharges were seen throughout the recording. A  normal interictal EEG does not exclude the diagnosis of epilepsy. Priyanka O Yadav   CT Head Wo Contrast Result Date: 2024/02/29 CLINICAL DATA:  Fall, altered mental status EXAM: CT HEAD WITHOUT CONTRAST TECHNIQUE: Contiguous axial images were obtained from the base of the skull through the vertex without intravenous contrast. RADIATION DOSE REDUCTION: This exam was performed according to the departmental dose-optimization program which includes automated exposure control, adjustment of the mA and/or kV according to patient size and/or use of iterative reconstruction technique. COMPARISON:  None Available. FINDINGS: Brain: Gray-white matter differentiation is preserved. Nonspecific mild periventricular and subcortical white matter hypodensities, likely secondary to chronic microvascular disease. No acute intracranial hemorrhage. No hydrocephalus. No midline shift. Basal cisterns are patent. Vascular: Unremarkable. Skull: No acute findings. Sinuses/Orbits: No acute finding. Other: None. IMPRESSION: No acute intracranial pathology. Electronically Signed   By: Michaeline Blanch M.D.   On: 29-Feb-2024 12:04   DG Chest Port 1 View Result Date: February 29, 2024 CLINICAL DATA:  Hypotensive, syncope, fall EXAM: PORTABLE CHEST 1 VIEW COMPARISON:  December 14, 2020 FINDINGS: Low lung volumes and slight patient rotation, limiting evaluation. No definite consolidations. No pleural effusions. No pneumothorax. Prominent cardiomediastinal silhouette, likely exaggerated by low lung volumes and AP technique. No acute osseous findings. IMPRESSION:  Limited examination.  Hypoinflation.  No definite acute findings. Electronically Signed   By: Michaeline Blanch M.D.   On: 02/14/2024 12:01     Assessment and Plan:  Breanna Riley is a 63 y.o. female with a hx of HLD, OSA, morbid obesity, spinal stenosis s/p lumbar fusion, and GERD who is being seen 02/14/2024 for the evaluation of syncope at the request of Dr. Caleen.  #Syncope Likely Vasovagal vs  Orthostasis :: Patient presenting with syncope and found to have multiple metabolic derangements, profound hypotension and shock.  I suspect that her syncope was vasovagal vs orthostatic in etiology given how profoundly hypovolemic she is.  No evidence of CHF by physical exam to suggest cardiogenic shock.  I agree with ongoing fluid resuscitation and treating empirically for sepsis.  I agree with obtaining an echo to ensure that there is not a cardiac component to her syncope. - Complete echo - Agree with IVF resuscitation per primary - Agree with broad-spectrum antibiotic coverage pending evaluation - Agree with infectious workup   #Myocardial Injury 2/2 Shock :: Troponins found to be elevated on admission.  ECG shows TWIs in V1-V3.  The patient denies having any chest pain or SOB.  I believe that her troponin elevation represents myocardial injury secondary to profound hypotension and shock.  I do not believe that the patient is having an acute coronary syndrome.  I recommend focusing on correcting her shock as described above. - No need for heparinization - Echo as above  #Sinus Tachycardia :: Patient is in sinus tachycardia as result of her shock.  #HLD - Continue home statin   Risk Assessment/Risk Scores:              For questions or updates, please contact [facility] Please consult [email or web address] for contact info under      Signed, Georganna Archer, MD  02/14/2024 6:28 PM

## 2024-02-14 NOTE — ED Triage Notes (Signed)
 Pt BIB [facility] from home for fall.with syncope.  Pt syncopized 3 times with family and once with EMS.  Pt was unresponsive on EMS arrival to scene. When pt fell a table fell on her hitting her chest. Pt was incontinent, hpotensive with 72 Palp.  EMS gave zofran .  When pt sycnopized for EMS in truck pupils became 7 and pt was unresponsive with low pressures 50/80 no radial pulse.SABRA  Pt came around without intervention.  EMS started 20G in [facility].  Pt gave 250 mL of NS flowing slowly.  EMS then switched to Epi drip. Last pressure was 50/30.with radial.  Pt is lethargic but responsive on arrival.

## 2024-02-14 NOTE — Progress Notes (Signed)
 Trops are trending up. 52 > 332 Repeat EKG shows TWI in anterior leads She is still feeling dizzy with soft BP Lactic acid improving, Mg is low.  EEG is neg. TSH, and UA are normal.  Echo is pending.   Will order Mag replacement, 500cc NS bolus, Heparin drip, and consulted [facility] cardiology (will see the patient later today).   Burgess Dare MD TRH

## 2024-02-14 NOTE — Progress Notes (Signed)
 ANTICOAGULATION CONSULT NOTE  Pharmacy Consult for Heparin Indication: chest pain/ACS  Allergies  Allergen Reactions   Naproxen Other (See Comments)    Nose bleeding    Patient Measurements: Height: 4' 11 (149.9 cm) Weight: 123.8 kg (273 lb) IBW/kg (Calculated) : 43.2 Heparin Dosing Weight: 74.9 kg  Vital Signs: Temp: 97.8 F (36.6 C) (09/30 1613) Temp Source: Oral (09/30 1613) BP: 91/56 (09/30 1645) Pulse Rate: 122 (09/30 1600)  Labs: Recent Labs    02/14/24 1132 02/14/24 1147 02/14/24 1544 02/14/24 1545  HGB 11.0* 11.6*  --  10.6*  HCT 35.0* 34.0*  --  33.2*  PLT 295  --   --  241  LABPROT 13.8  --   --   --   INR 1.0  --   --   --   CREATININE 1.63* 1.60*  --  1.37*  TROPONINIHS 52*  --  332*  --     Estimated Creatinine Clearance: 48.7 mL/min (A) (by C-G formula based on SCr of 1.37 mg/dL (H)).   Medical History: Past Medical History:  Diagnosis Date   Hyperlipidemia    Knee pain    Overweight    Reflux     Medications:  (Not in a hospital admission)  Scheduled:   atorvastatin  20 mg Oral Daily   cholecalciferol  1,000 Units Oral BID   [START ON 02/15/2024] ferrous gluconate  324 mg Oral Q breakfast   heparin  5,000 Units Subcutaneous Q8H   pantoprazole  40 mg Oral QAC breakfast   pregabalin  200 mg Oral Daily   sertraline  50 mg Oral Daily   Infusions:   sodium chloride     magnesium sulfate bolus IVPB     potassium chloride 10 mEq (02/14/24 1646)   sodium chloride     PRN: acetaminophen **OR** acetaminophen, albuterol, bisacodyl, glucagon (human recombinant), hydrALAZINE, HYDROmorphone (DILAUDID) injection, nicotine, ondansetron  **OR** ondansetron  (ZOFRAN ) IV, oxyCODONE, senna-docusate, sodium phosphate  Assessment: 63 yof with a history of OSA, HLD. Patient is presenting with syncope. Heparin per pharmacy consult placed for chest pain/ACS.  Patient is not on anticoagulation prior to arrival.  Hgb 10.6; plt 241 PT/INR 13.8/1  Goal  of Therapy:  Heparin level 0.3-0.7 units/ml Monitor platelets by anticoagulation protocol: Yes   Plan:  Give IV heparin 4000 units bolus x 1 Start heparin infusion at 900 units/hr Check anti-Xa level in 8 hours and daily while on heparin Continue to monitor H&H and platelets  Dorn Buttner, PharmD, BCPS 02/14/2024 4:59 PM ED Clinical Pharmacist -  [phone]

## 2024-02-14 NOTE — Significant Event (Incomplete)
 Rapid Response Event Note   Reason for Call :  Pt seen during rounds. SBP-70s, SpO2-88-90% on CPAP with 4L oxygen bled in.  Initial Focused Assessment:  Pt resting in bed on CPAP. Her breathing is unlabored. She awakes easily to voice and is oriented and able to answer questions/follow commands. Lungs clear/diminished. Skin cool/clammy/diaphoretic.   T-97.9, HR-106, BP-73/50, RR-18, SpO2-91% on CPAP/4L  Interventions:  CBG-127 Levophed gtt PCCM consulted: Tx to ICU Plan of Care:     Event Summary:   MD Notified: Dr. Franky notified and came to bedside. PCCM consulted: Dr. Maree to bedside. Call Time:2230(patient seen on rounds) Arrival Time:2230 End Time:  Tish Graeme Piety, RN

## 2024-02-15 ENCOUNTER — Other Ambulatory Visit (HOSPITAL_COMMUNITY): Payer: Self-pay

## 2024-02-15 ENCOUNTER — Telehealth (HOSPITAL_COMMUNITY): Payer: Self-pay | Admitting: Pharmacy Technician

## 2024-02-15 ENCOUNTER — Inpatient Hospital Stay (HOSPITAL_COMMUNITY)

## 2024-02-15 ENCOUNTER — Encounter (HOSPITAL_COMMUNITY)

## 2024-02-15 ENCOUNTER — Other Ambulatory Visit: Payer: Self-pay

## 2024-02-15 DIAGNOSIS — R7989 Other specified abnormal findings of blood chemistry: Secondary | ICD-10-CM

## 2024-02-15 DIAGNOSIS — I2699 Other pulmonary embolism without acute cor pulmonale: Secondary | ICD-10-CM

## 2024-02-15 DIAGNOSIS — R6510 Systemic inflammatory response syndrome (SIRS) of non-infectious origin without acute organ dysfunction: Secondary | ICD-10-CM

## 2024-02-15 DIAGNOSIS — I2609 Other pulmonary embolism with acute cor pulmonale: Secondary | ICD-10-CM | POA: Diagnosis not present

## 2024-02-15 DIAGNOSIS — I10 Essential (primary) hypertension: Secondary | ICD-10-CM

## 2024-02-15 DIAGNOSIS — N179 Acute kidney failure, unspecified: Secondary | ICD-10-CM | POA: Diagnosis not present

## 2024-02-15 DIAGNOSIS — I428 Other cardiomyopathies: Secondary | ICD-10-CM | POA: Diagnosis not present

## 2024-02-15 DIAGNOSIS — E785 Hyperlipidemia, unspecified: Secondary | ICD-10-CM

## 2024-02-15 DIAGNOSIS — I214 Non-ST elevation (NSTEMI) myocardial infarction: Secondary | ICD-10-CM

## 2024-02-15 HISTORY — PX: IR US GUIDE VASC ACCESS RIGHT: IMG2390

## 2024-02-15 HISTORY — PX: IR ANGIOGRAM PULMONARY BILATERAL SELECTIVE: IMG664

## 2024-02-15 HISTORY — PX: IR ANGIOGRAM SELECTIVE EACH ADDITIONAL VESSEL: IMG667

## 2024-02-15 HISTORY — PX: IR THROMBECT PRIM MECH ADD (INCLU) MOD SED: IMG2298

## 2024-02-15 HISTORY — PX: IR THROMBECT PRIM MECH INIT (INCLU) MOD SED: IMG2297

## 2024-02-15 LAB — MAGNESIUM
Magnesium: 1.7 mg/dL (ref 1.7–2.4)
Magnesium: 1.7 mg/dL (ref 1.7–2.4)

## 2024-02-15 LAB — COOXEMETRY PANEL
Carboxyhemoglobin: 1.7 % — ABNORMAL HIGH (ref 0.5–1.5)
Methemoglobin: <0.7 % (ref 0.0–1.5)
O2 Saturation: 51.7 %
Total hemoglobin: 9.4 g/dL — ABNORMAL LOW (ref 12.0–16.0)

## 2024-02-15 LAB — CBC WITH DIFFERENTIAL/PLATELET
Abs Immature Granulocytes: 0.04 K/uL (ref 0.00–0.07)
Basophils Absolute: 0 K/uL (ref 0.0–0.1)
Basophils Relative: 0 %
Eosinophils Absolute: 0 K/uL (ref 0.0–0.5)
Eosinophils Relative: 0 %
HCT: 32.7 % — ABNORMAL LOW (ref 36.0–46.0)
Hemoglobin: 10.2 g/dL — ABNORMAL LOW (ref 12.0–15.0)
Immature Granulocytes: 0 %
Lymphocytes Relative: 11 %
Lymphs Abs: 1 K/uL (ref 0.7–4.0)
MCH: 27.8 pg (ref 26.0–34.0)
MCHC: 31.2 g/dL (ref 30.0–36.0)
MCV: 89.1 fL (ref 80.0–100.0)
Monocytes Absolute: 0.5 K/uL (ref 0.1–1.0)
Monocytes Relative: 6 %
Neutro Abs: 7.7 K/uL (ref 1.7–7.7)
Neutrophils Relative %: 83 %
Platelets: 288 K/uL (ref 150–400)
RBC: 3.67 MIL/uL — ABNORMAL LOW (ref 3.87–5.11)
RDW: 13.6 % (ref 11.5–15.5)
WBC: 9.3 K/uL (ref 4.0–10.5)
nRBC: 0 % (ref 0.0–0.2)

## 2024-02-15 LAB — COMPREHENSIVE METABOLIC PANEL WITH GFR
ALT: 41 U/L (ref 0–44)
AST: 73 U/L — ABNORMAL HIGH (ref 15–41)
Albumin: 2.8 g/dL — ABNORMAL LOW (ref 3.5–5.0)
Alkaline Phosphatase: 66 U/L (ref 38–126)
Anion gap: 11 (ref 5–15)
BUN: 17 mg/dL (ref 8–23)
CO2: 21 mmol/L — ABNORMAL LOW (ref 22–32)
Calcium: 8.6 mg/dL — ABNORMAL LOW (ref 8.9–10.3)
Chloride: 104 mmol/L (ref 98–111)
Creatinine, Ser: 2.01 mg/dL — ABNORMAL HIGH (ref 0.44–1.00)
GFR, Estimated: 27 mL/min — ABNORMAL LOW (ref >60)
Glucose, Bld: 150 mg/dL — ABNORMAL HIGH (ref 70–99)
Potassium: 4.3 mmol/L (ref 3.5–5.1)
Sodium: 136 mmol/L (ref 135–145)
Total Bilirubin: 1.1 mg/dL (ref 0.0–1.2)
Total Protein: 5.9 g/dL — ABNORMAL LOW (ref 6.5–8.1)

## 2024-02-15 LAB — POCT ACTIVATED CLOTTING TIME
Activated Clotting Time: 176 s
Activated Clotting Time: 250 s
Activated Clotting Time: 233 s
Activated Clotting Time: 245 s

## 2024-02-15 LAB — TROPONIN I (HIGH SENSITIVITY)
Troponin I (High Sensitivity): 665 ng/L (ref <18)
Troponin I (High Sensitivity): 596 ng/L (ref <18)

## 2024-02-15 LAB — CBC
HCT: 28 % — ABNORMAL LOW (ref 36.0–46.0)
Hemoglobin: 8.9 g/dL — ABNORMAL LOW (ref 12.0–15.0)
MCH: 27.9 pg (ref 26.0–34.0)
MCHC: 31.8 g/dL (ref 30.0–36.0)
MCV: 87.8 fL (ref 80.0–100.0)
Platelets: 237 K/uL (ref 150–400)
RBC: 3.19 MIL/uL — ABNORMAL LOW (ref 3.87–5.11)
RDW: 13.9 % (ref 11.5–15.5)
WBC: 8.7 K/uL (ref 4.0–10.5)
nRBC: 0 % (ref 0.0–0.2)

## 2024-02-15 LAB — BASIC METABOLIC PANEL WITH GFR
Anion gap: 8 (ref 5–15)
BUN: 18 mg/dL (ref 8–23)
CO2: 22 mmol/L (ref 22–32)
Calcium: 8.1 mg/dL — ABNORMAL LOW (ref 8.9–10.3)
Chloride: 104 mmol/L (ref 98–111)
Creatinine, Ser: 1.81 mg/dL — ABNORMAL HIGH (ref 0.44–1.00)
GFR, Estimated: 31 mL/min — ABNORMAL LOW (ref >60)
Glucose, Bld: 108 mg/dL — ABNORMAL HIGH (ref 70–99)
Potassium: 3.8 mmol/L (ref 3.5–5.1)
Sodium: 134 mmol/L — ABNORMAL LOW (ref 135–145)

## 2024-02-15 LAB — LACTIC ACID, PLASMA
Lactic Acid, Venous: 1.8 mmol/L (ref 0.5–1.9)
Lactic Acid, Venous: 1 mmol/L (ref 0.5–1.9)

## 2024-02-15 LAB — ECHOCARDIOGRAM COMPLETE
AR max vel: 2.78 cm2
AV Area VTI: 2.66 cm2
AV Area mean vel: 2.71 cm2
AV Mean grad: 4 mmHg
AV Peak grad: 6.7 mmHg
Ao pk vel: 1.29 m/s
Area-P 1/2: 3.12 cm2
Height: 59 in
S' Lateral: 2.7 cm
Weight: 4504.44 [oz_av]

## 2024-02-15 LAB — MRSA NEXT GEN BY PCR, NASAL: MRSA by PCR Next Gen: NOT DETECTED

## 2024-02-15 LAB — PHOSPHORUS: Phosphorus: 4.6 mg/dL (ref 2.5–4.6)

## 2024-02-15 LAB — GLUCOSE, CAPILLARY
Glucose-Capillary: 124 mg/dL — ABNORMAL HIGH (ref 70–99)
Glucose-Capillary: 99 mg/dL (ref 70–99)

## 2024-02-15 MED ORDER — LIDOCAINE HCL (PF) 1 % IJ SOLN
INTRAMUSCULAR | Status: AC
  Administered 2024-02-15: 5 mL
  Filled 2024-02-15: qty 5

## 2024-02-15 MED ORDER — HYDROXYZINE HCL 10 MG PO TABS: 10.0000 mg | ORAL_TABLET | Freq: Three times a day (TID) | ORAL | Status: DC | PRN

## 2024-02-15 MED ORDER — HEPARIN SODIUM (PORCINE) 1000 UNIT/ML IJ SOLN
INTRAMUSCULAR | Status: AC | PRN
  Administered 2024-02-15: 5000 [IU] via INTRAVENOUS

## 2024-02-15 MED ORDER — NOREPINEPHRINE 4 MG/250ML-% IV SOLN
0.0000 ug/min | INTRAVENOUS | Status: DC
  Administered 2024-02-15: 9 ug/min via INTRAVENOUS
  Administered 2024-02-16: 10 ug/min via INTRAVENOUS
  Filled 2024-02-15 (×2): qty 250
  Filled 2024-02-15: qty 500

## 2024-02-15 MED ORDER — MAGNESIUM SULFATE 2 GM/50ML IV SOLN
2.0000 g | Freq: Once | INTRAVENOUS | Status: AC
Start: 2024-02-15 — End: 2024-02-15
  Administered 2024-02-15: 2 g via INTRAVENOUS
  Filled 2024-02-15: qty 50

## 2024-02-15 MED ORDER — SODIUM CHLORIDE 0.9 % IV SOLN
250.0000 mL | INTRAVENOUS | Status: AC
  Administered 2024-02-15: 250 mL via INTRAVENOUS

## 2024-02-15 MED ORDER — TRAZODONE HCL 50 MG PO TABS
50.0000 mg | ORAL_TABLET | Freq: Every evening | ORAL | Status: DC | PRN
  Administered 2024-02-15 – 2024-02-27 (×7): 50 mg via ORAL
  Filled 2024-02-15 (×7): qty 1

## 2024-02-15 MED ORDER — IOHEXOL 300 MG/ML  SOLN
150.0000 mL | Freq: Once | INTRAMUSCULAR | Status: AC | PRN
Start: 2024-02-15 — End: 2024-02-15
  Administered 2024-02-15: 120 mL via INTRAVENOUS

## 2024-02-15 MED ORDER — FUROSEMIDE 10 MG/ML IJ SOLN
40.0000 mg | Freq: Once | INTRAMUSCULAR | Status: AC
Start: 2024-02-15 — End: 2024-02-15
  Administered 2024-02-15: 40 mg via INTRAVENOUS
  Filled 2024-02-15: qty 4

## 2024-02-15 MED ORDER — IOHEXOL 350 MG/ML SOLN
75.0000 mL | Freq: Once | INTRAVENOUS | Status: AC | PRN
  Administered 2024-02-15: 75 mL via INTRAVENOUS

## 2024-02-15 MED ORDER — HEPARIN (PORCINE) 25000 UT/250ML-% IV SOLN
1400.0000 [IU]/h | INTRAVENOUS | Status: DC
  Administered 2024-02-15 (×2): 1400 [IU]/h via INTRAVENOUS
  Filled 2024-02-15 (×2): qty 250

## 2024-02-15 MED ORDER — PERFLUTREN LIPID MICROSPHERE
1.0000 mL | INTRAVENOUS | Status: AC | PRN
  Administered 2024-02-15: 2 mL via INTRAVENOUS

## 2024-02-15 MED ORDER — FENTANYL CITRATE PF 50 MCG/ML IJ SOSY: 25.0000 ug | PREFILLED_SYRINGE | Freq: Once | INTRAMUSCULAR | Status: AC

## 2024-02-15 MED ORDER — FENTANYL CITRATE (PF) 100 MCG/2ML IJ SOLN
INTRAMUSCULAR | Status: AC | PRN
  Administered 2024-02-15 (×3): 50 ug via INTRAVENOUS

## 2024-02-15 MED ORDER — IOHEXOL 300 MG/ML  SOLN
100.0000 mL | Freq: Once | INTRAMUSCULAR | Status: AC | PRN
Start: 2024-02-15 — End: 2024-02-15
  Administered 2024-02-15: 10 mL via INTRAVENOUS

## 2024-02-15 MED ORDER — CHLORHEXIDINE GLUCONATE CLOTH 2 % EX PADS
6.0000 | MEDICATED_PAD | Freq: Every day | CUTANEOUS | Status: DC
  Administered 2024-02-15 – 2024-02-21 (×7): 6 via TOPICAL

## 2024-02-15 MED ORDER — LIDOCAINE HCL 1 % IJ SOLN
INTRAMUSCULAR | Status: AC
  Filled 2024-02-15: qty 20

## 2024-02-15 MED ORDER — IOHEXOL 300 MG/ML  SOLN
50.0000 mL | Freq: Once | INTRAMUSCULAR | Status: AC | PRN
  Administered 2024-02-15: 50 mL via INTRAVENOUS

## 2024-02-15 MED ORDER — LIDOCAINE HCL 1 % IJ SOLN
20.0000 mL | Freq: Once | INTRAMUSCULAR | Status: AC
  Administered 2024-02-15: 10 mL
  Filled 2024-02-15: qty 20

## 2024-02-15 MED ORDER — FENTANYL CITRATE PF 50 MCG/ML IJ SOSY
PREFILLED_SYRINGE | INTRAMUSCULAR | Status: AC
  Administered 2024-02-15: 25 ug via INTRAVENOUS
  Filled 2024-02-15: qty 1

## 2024-02-15 MED ORDER — SODIUM CHLORIDE 0.9% FLUSH: 10.0000 mL | INTRAVENOUS | Status: DC | PRN

## 2024-02-15 MED ORDER — SODIUM CHLORIDE 0.9% FLUSH
10.0000 mL | Freq: Two times a day (BID) | INTRAVENOUS | Status: DC
  Administered 2024-02-15 – 2024-02-16 (×3): 10 mL
  Administered 2024-02-17: 20 mL
  Administered 2024-02-17: 10 mL
  Administered 2024-02-18 (×2): 20 mL
  Administered 2024-02-19 – 2024-02-20 (×4): 10 mL
  Administered 2024-02-21: 20 mL
  Administered 2024-02-22 – 2024-02-24 (×3): 10 mL

## 2024-02-15 MED ORDER — FUROSEMIDE 10 MG/ML IJ SOLN
40.0000 mg | Freq: Once | INTRAMUSCULAR | Status: AC
  Administered 2024-02-15: 40 mg via INTRAVENOUS
  Filled 2024-02-15: qty 4

## 2024-02-15 MED ORDER — MIDAZOLAM HCL 2 MG/2ML IJ SOLN
INTRAMUSCULAR | Status: AC | PRN
  Administered 2024-02-15 (×2): 1 mg via INTRAVENOUS

## 2024-02-15 MED ORDER — HEPARIN SODIUM (PORCINE) 1000 UNIT/ML IJ SOLN
INTRAMUSCULAR | Status: AC
  Filled 2024-02-15: qty 10

## 2024-02-15 MED ORDER — MIDAZOLAM HCL 2 MG/2ML IJ SOLN
INTRAMUSCULAR | Status: AC
  Filled 2024-02-15: qty 2

## 2024-02-15 MED ORDER — HEPARIN BOLUS VIA INFUSION
4000.0000 [IU] | Freq: Once | INTRAVENOUS | Status: AC
  Administered 2024-02-15: 4000 [IU] via INTRAVENOUS
  Filled 2024-02-15: qty 4000

## 2024-02-15 MED ORDER — FENTANYL CITRATE (PF) 100 MCG/2ML IJ SOLN
INTRAMUSCULAR | Status: AC
  Filled 2024-02-15: qty 2

## 2024-02-15 NOTE — Telephone Encounter (Signed)
 Patient Product/process development scientist completed.    The patient is insured through [employer]. Patient has Medicare and is not eligible for a copay card, but may be able to apply for patient assistance or Medicare RX Payment Plan (Patient Must reach out to their plan, if eligible for payment plan), if available.    Ran test claim for enoxaparin (Lovenox) 100 mg/ml and the current 30 day co-pay is $380.97 due to a $250.00 deductible.   This test claim was processed through [facility]- copay amounts may vary at other pharmacies due to pharmacy/plan contracts, or as the patient moves through the different stages of their insurance plan.     Breanna Riley Sharps, CPHT Pharmacy Technician III Certified Patient Advocate [facility] Patient Advocate Team Direct Number: [phone]  Fax: [phone]

## 2024-02-15 NOTE — Progress Notes (Signed)
 Referring Physician(s): Dr. Maree   Supervising Physician: Karalee Beat  Patient Status:  [facility] - In-pt  Chief Complaint: Pulmonary emboli with right heart strain s/p pulmonary artery thrombectomy 02/15/24 with Dr. Johann.   Subjective: Patient resting comfortably in bed on CPAP. Her son and sister are at the bedside. Patient states she feels better but is just tired.   Allergies: Naproxen  Medications: Prior to Admission medications   Medication Sig Start Date End Date Taking? Authorizing Provider  atorvastatin (LIPITOR) 20 MG tablet Take 20 mg by mouth daily.   Yes [provider]  cholecalciferol (VITAMIN D) 1000 UNITS tablet Take 1,000 Units by mouth 2 (two) times daily.   Yes [provider]  Cyclobenzaprine HCl (FLEXERIL PO) Take 10 mg by mouth daily as needed (muscle spasms).   Yes [provider]  esomeprazole (NEXIUM) 40 MG capsule Take 40 mg by mouth daily at 12 noon.   Yes [provider]  esomeprazole (NEXIUM) 40 MG capsule Take 40 mg by mouth daily.   Yes [provider]  fluticasone (FLONASE) 50 MCG/ACT nasal spray Place 1 spray into both nostrils daily.   Yes [provider]  hydrochlorothiazide (HYDRODIURIL) 25 MG tablet Take 25 mg by mouth daily. 11/02/23  Yes [provider]  meloxicam (MOBIC) 15 MG tablet Take 15 mg by mouth daily as needed for pain.   Yes [provider]  polyethylene glycol (MIRALAX / GLYCOLAX) 17 g packet Take 17 g by mouth daily as needed for mild constipation.   Yes [provider]  pregabalin (LYRICA) 150 MG capsule Take 200 mg by mouth in the morning, at noon, and at bedtime.   Yes [provider]  Semaglutide (RYBELSUS) 3 MG TABS Take 3 mg by mouth daily.   Yes [provider]  sertraline (ZOLOFT) 50 MG tablet Take 50 mg by mouth daily.   Yes [provider]  traMADol (ULTRAM) 50 MG tablet Take 50 mg by mouth every 12 (twelve)  hours as needed for moderate pain (pain score 4-6) or severe pain (pain score 7-10). 12/21/19  Yes [provider]  triamcinolone cream (KENALOG) 0.5 % Apply 1 application topically 3 (three) times daily.   Yes [provider]  ascorbic acid (VITAMIN C) 100 MG tablet Take 1 tablet by mouth daily. Patient not taking: Reported on 02/15/2024 12/03/19   [provider]  cyanocobalamin 1000 MCG tablet Take by mouth. Patient not taking: Reported on 02/14/2024 12/03/19   [provider]  ferrous gluconate (FERGON) 324 MG tablet Take by mouth. Patient not taking: Reported on 02/14/2024 12/03/19   [provider]  ibuprofen (ADVIL,MOTRIN) 200 MG tablet Take 200 mg by mouth every 6 (six) hours as needed. Patient not taking: Reported on 02/14/2024    [provider]  Liraglutide -Weight Management (SAXENDA Pringle) Inject 30 mg into the skin daily. Patient not taking: Reported on 02/14/2024    [provider]  loratadine (CLARITIN) 10 MG tablet Take 10 mg by mouth daily. Patient not taking: Reported on 07/31/2020    [provider]  methocarbamol (ROBAXIN) 500 MG tablet Take by mouth. Patient not taking: Reported on 02/14/2024 11/15/19   [provider]     Vital Signs: BP (!) 91/56   Pulse 84   Temp 98.1 F (36.7 C)   Resp 16   Ht 4' 11 (1.499 m)   Wt 281 lb 8.4 oz (127.7 kg)   SpO2 94%  BMI 56.86 kg/m   Physical Exam Constitutional:      General: She is not in acute distress.    Appearance: She is obese. She is not ill-appearing.  Cardiovascular:     Comments: Right common femoral vein vascular site with purse-string suture in place. Site began to profusely bleed after removal of purse-string suture. Sight re-sutured by IR MD with pressure band placed over the site.  Pulmonary:     Effort: Pulmonary effort is normal.  Abdominal:     Tenderness: There is no abdominal tenderness.  Skin:    General: Skin is warm and dry.   Neurological:     Mental Status: She is alert and oriented to person, place, and time.      Labs:  CBC: Recent Labs    02/14/24 1132 02/14/24 1147 02/14/24 1545 02/15/24 0110  WBC 11.6*  --  8.0 9.3  HGB 11.0* 11.6* 10.6* 10.2*  HCT 35.0* 34.0* 33.2* 32.7*  PLT 295  --  241 288    COAGS: Recent Labs    02/14/24 1132  INR 1.0    BMP: Recent Labs    02/14/24 1132 02/14/24 1147 02/14/24 1545 02/15/24 0110  NA 140 141  --  136  K 2.9* 2.8*  --  4.3  CL 104 104  --  104  CO2 20*  --   --  21*  GLUCOSE 248* 247*  --  150*  BUN 15 17  --  17  CALCIUM 9.5  --   --  8.6*  CREATININE 1.63* 1.60* 1.37* 2.01*  GFRNONAA 35*  --  43* 27*    LIVER FUNCTION TESTS: Recent Labs    02/14/24 1132 02/15/24 0110  BILITOT 1.3* 1.1  AST 57* 73*  ALT 35 41  ALKPHOS 58 66  PROT 6.6 5.9*  ALBUMIN 3.2* 2.8*    Assessment and Plan:  Pulmonary emboli with right heart strain s/p pulmonary artery thrombectomy 02/15/24 with Dr. Johann.   A purse-string suture was placed into the right common femoral vein post-thrombectomy with instructions to remove suture after 6 hours. Patient was resting comfortably during afternoon assessment and denied pain or discomfort. The right femoral vascular site was clean, soft, dry and minimally tender to palpation. The dressing was removed and the suture with small tension device was intact. Site was clean/dry.   ICU RN assisted with removal of purse-string suture. Site was initially stable but within 5-10 seconds post-removal the site began to profusely bleed. Pressure was held and the IR MD was called to the bedside. Dr. Karalee arrived quickly and with the assistance of Leita Gleason, PA and Dr. Cherrie the site was re-sutured. The site was then covered with gauze and a FemoStop device was utilized. Hemostasis was achieved.   The timing of suture removal will be determined by Dr. Nelle team but IR will continue to follow.    Electronically Signed: Warren Dais, AGACNP-BC 02/15/2024, 1:47 PM   I spent a total of 15 Minutes at the the patient's bedside AND on the patient's hospital floor or unit, greater than 50% of which was counseling/coordinating care for pulmonary embolism

## 2024-02-15 NOTE — Inpatient Diabetes Management (Signed)
 Inpatient Diabetes Program Recommendations  AACE/ADA: New Consensus Statement on Inpatient Glycemic Control (2015)  Target Ranges:  Prepandial:   less than 140 mg/dL      Peak postprandial:   less than 180 mg/dL (1-2 hours)      Critically ill patients:  140 - 180 mg/dL   Lab Results  Component Value Date   GLUCAP 124 (H) 02/15/2024    Review of Glycemic Control  Latest Reference Range & Units 02/15/24 00:41  Glucose-Capillary 70 - 99 mg/dL 875 (H)  (H): Data is abnormally high Diabetes history: Type 2 DM Outpatient Diabetes medications: Rybelsus 3 mg QD Current orders for Inpatient glycemic control: none  Inpatient Diabetes Program Recommendations:    Consider adding CBGs TID & HS.  Thanks, Tinnie Minus, MSN, RNC-OB Diabetes Coordinator [phone] (8a-5p)

## 2024-02-15 NOTE — Progress Notes (Signed)
 PT Cancellation Note  Patient Details Name: Carsyn Boster MRN: [id number] DOB: 1960-05-30   Cancelled Treatment:    Reason Eval/Treat Not Completed:  (Medical issues which prohibited therapy (pt with bil PE with RV strain, not yet been on anticoagulation x24 hours per therapy protocol. Also with active bedrest order.)   Stephane JULIANNA Bevel 02/15/2024, 9:04 AM Antoino Westhoff M,PT Acute Rehab Services [phone]

## 2024-02-15 NOTE — Sedation Documentation (Signed)
 ACT result 245, results given to Dr. Johann

## 2024-02-15 NOTE — Progress Notes (Signed)
 NAME:  Breanna Riley, MRN:  [id number], DOB:  [birth year], LOS: 1 ADMISSION DATE:  02/14/2024, CONSULTATION DATE:  02/15/2024 REFERRING MD:  Redia Cleaver, MD, CHIEF COMPLAINT:  Hypotension  History of Present Illness:  63 y/o female with PMH for OSA, Obseity, HLD  who presented with syncope and was admitted to Hospitalist service.  She has lost 30 lbs with [facility] and was started on hydrochlorothiazide 25 mg a few days ago.  She started with Lightheadedness 2 days ago and then had 2 episodes of syncope last about 1 min.  Lab work on admission showing AKI and increased LA.  She was started on IV fluids and she has been given 5 liters and still remains hypotensive.  She was started on Levophed.  Pertinent  Medical History  OSA, Obseity, HLD    Significant Hospital Events: Including procedures, antibiotic start and stop dates in addition to other pertinent events   10/1: transfer to ICU, s/p thrombectomy, requiring norepi  Interim History / Subjective:  Pt evaluated after thrombectomy, she is awake and comfortable on 10L  and norepi No chest pain   Objective    Blood pressure 99/60, pulse 80, temperature 98.1 F (36.7 C), temperature source Axillary, resp. rate 16, height 4' 11 (1.499 m), weight 127.7 kg, SpO2 100%. PAP: (64)/(36) 64/36  FiO2 (%):  [36 %] 36 %   Intake/Output Summary (Last 24 hours) at 02/15/2024 1037 Last data filed at 02/15/2024 1000 Gross per 24 hour  Intake 4820.52 ml  Output --  Net 4820.52 ml   Filed Weights   02/14/24 1201 02/14/24 1835  Weight: 123.8 kg 127.7 kg    General:  well nourished F resting in bed in NAD HEENT: MM pink/moist, sclera anicteric  Neuro: alert and oriented, moving all extremities CV: s1s2 rrr, no m/r/g PULM:  clear bilaterally on 10L without respiratory distress GI: soft, non-distended and non-tender  Extremities: warm/dry, trace LE edema     Resolved problem list   Assessment and Plan   Syncope  and obstructive shock secondary to extensive bilateral Pulmonary Emboli with cor pulmonale and acute hypoxic respiratory failure  S/p  successful thrombectomy -echo done and results pending -remains on norepi 6mcg, continue to maintain MAP >65 -continue heparin gtt today, likely transition to DOAC tomorrow  -LE dopplers -trend troponin -Acute heart failure following, gave lasix 40mg  and recommend further ischemic work-up as renal function allows  -no clear inciting factor, has been taking Rybelsus for approximately three months, discussed with pharmacy and there have been some case reports of DVT/PE    AKI Baseline <1.0, up to 2.01 this AM  Has made 1.4L urine since arrival  -follow renal indices s/p lasix -ensure renal perfusion and avoid nephrotoxins    GERD -PPI  OSA -CPAP qhs  Depression/Anxiety -continue zoloft and lyrica with prn hydroxyzine and trazodone at bedtime    Labs   CBC: Recent Labs  Lab 02/14/24 1132 02/14/24 1147 02/14/24 1545 02/15/24 0110  WBC 11.6*  --  8.0 9.3  NEUTROABS 8.8*  --   --  7.7  HGB 11.0* 11.6* 10.6* 10.2*  HCT 35.0* 34.0* 33.2* 32.7*  MCV 89.3  --  88.1 89.1  PLT 295  --  241 288    Basic Metabolic Panel: Recent Labs  Lab 02/14/24 1132 02/14/24 1147 02/14/24 1545 02/15/24 0108 02/15/24 0110  NA 140 141  --   --  136  K 2.9* 2.8*  --   --  4.3  CL 104 104  --   --  104  CO2 20*  --   --   --  21*  GLUCOSE 248* 247*  --   --  150*  BUN 15 17  --   --  17  CREATININE 1.63* 1.60* 1.37*  --  2.01*  CALCIUM 9.5  --   --   --  8.6*  MG  --   --  1.3* 1.7  --   PHOS  --   --  3.2 4.6  --    GFR: Estimated Creatinine Clearance: 33.9 mL/min (A) (by C-G formula based on SCr of 2.01 mg/dL (H)). Recent Labs  Lab 02/14/24 1132 02/14/24 1147 02/14/24 1537 02/14/24 1544 02/14/24 1545 02/15/24 0110  PROCALCITON  --   --   --  0.38  --   --   WBC 11.6*  --   --   --  8.0 9.3  LATICACIDVEN  --  4.9* 2.1*  --   --  1.8     Liver Function Tests: Recent Labs  Lab 02/14/24 1132 02/15/24 0110  AST 57* 73*  ALT 35 41  ALKPHOS 58 66  BILITOT 1.3* 1.1  PROT 6.6 5.9*  ALBUMIN 3.2* 2.8*   Recent Labs  Lab 02/14/24 1132  LIPASE 30   No results for input(s): AMMONIA in the last 168 hours.  ABG    Component Value Date/Time   TCO2 22 02/14/2024 1147     Coagulation Profile: Recent Labs  Lab 02/14/24 1132  INR 1.0    Cardiac Enzymes: No results for input(s): CKTOTAL, CKMB, CKMBINDEX, TROPONINI in the last 168 hours.  HbA1C: No results found for: HGBA1C  CBG: Recent Labs  Lab 02/14/24 1115 02/14/24 2254 02/15/24 0041  GLUCAP 254* 127* 124*    Review of Systems:   No chest pain, no n/v/d, no abd pain  Past Medical History:  She,  has a past medical history of Hyperlipidemia, Knee pain, Overweight, and Reflux.   Surgical History:  History reviewed. No pertinent surgical history.   Social History:   reports that she has never smoked. She does not have any smokeless tobacco history on file.   Family History:  Her family history is not on file.   Allergies Allergies  Allergen Reactions   Naproxen Other (See Comments)    Nose bleeding     Home Medications  Prior to Admission medications   Medication Sig Start Date End Date Taking? Authorizing Provider  atorvastatin (LIPITOR) 20 MG tablet Take 20 mg by mouth daily.   Yes [provider]  cholecalciferol (VITAMIN D) 1000 UNITS tablet Take 1,000 Units by mouth 2 (two) times daily.   Yes [provider]  Cyclobenzaprine HCl (FLEXERIL PO) Take 20 mg by mouth daily as needed (muscle spasms).   Yes [provider]  esomeprazole (NEXIUM) 40 MG capsule Take 40 mg by mouth daily at 12 noon.   Yes [provider]  esomeprazole (NEXIUM) 40 MG capsule Take 40 mg by mouth daily.   Yes [provider]  fluticasone (FLONASE) 50 MCG/ACT nasal spray Place 1 spray into both nostrils  daily.   Yes [provider]  hydrochlorothiazide (HYDRODIURIL) 25 MG tablet Take 25 mg by mouth daily. 11/02/23  Yes [provider]  meloxicam (MOBIC) 15 MG tablet Take 15 mg by mouth daily.   Yes [provider]  pregabalin (LYRICA) 150 MG capsule Take 250 mg by mouth in the morning,  at noon, and at bedtime.   Yes [provider]  sertraline (ZOLOFT) 50 MG tablet Take 50 mg by mouth daily.   Yes [provider]  traMADol (ULTRAM) 50 MG tablet Take 50 mg by mouth every 12 (twelve) hours as needed for moderate pain (pain score 4-6) or severe pain (pain score 7-10). 12/21/19  Yes [provider]  triamcinolone cream (KENALOG) 0.5 % Apply 1 application topically 3 (three) times daily.   Yes [provider]  ascorbic acid (VITAMIN C) 100 MG tablet Take 1 tablet by mouth daily. Patient not taking: Reported on 02/14/2024 12/03/19   [provider]  cyanocobalamin 1000 MCG tablet Take by mouth. Patient not taking: Reported on 02/14/2024 12/03/19   [provider]  ferrous gluconate (FERGON) 324 MG tablet Take by mouth. Patient not taking: Reported on 02/14/2024 12/03/19   [provider]  ibuprofen (ADVIL,MOTRIN) 200 MG tablet Take 200 mg by mouth every 6 (six) hours as needed. Patient not taking: Reported on 02/14/2024    [provider]  Liraglutide -Weight Management (SAXENDA Montesano) Inject 30 mg into the skin daily. Patient not taking: Reported on 02/14/2024    [provider]  loratadine (CLARITIN) 10 MG tablet Take 10 mg by mouth daily. Patient not taking: Reported on 07/31/2020    [provider]  methocarbamol (ROBAXIN) 500 MG tablet Take by mouth. Patient not taking: Reported on 02/14/2024 11/15/19   [provider]     Critical care time: additional 40 minutes     CRITICAL CARE Performed by: Leita SAUNDERS Graison Leinberger   Total critical care time: 40 minutes  Critical care time was  exclusive of separately billable procedures and treating other patients.  Critical care was necessary to treat or prevent imminent or life-threatening deterioration.  Critical care was time spent personally by me on the following activities: development of treatment plan with patient and/or surrogate as well as nursing, discussions with consultants, evaluation of patient's response to treatment, examination of patient, obtaining history from patient or surrogate, ordering and performing treatments and interventions, ordering and review of laboratory studies, ordering and review of radiographic studies, pulse oximetry and re-evaluation of patient's condition.   Leita SAUNDERS Jonathon Tan, PA-C [facility] See Amion for pager If no response to pager , please call [phone] until 7pm After 7:00 pm call Elink  [phone]

## 2024-02-15 NOTE — Progress Notes (Signed)
 PHARMACY - ANTICOAGULATION CONSULT NOTE  Pharmacy Consult for heparin Indication: pulmonary embolus  Allergies  Allergen Reactions   Naproxen Other (See Comments)    Nose bleeding    Patient Measurements: Height: 4' 11 (149.9 cm) Weight: 127.7 kg (281 lb 8.4 oz) IBW/kg (Calculated) : 43.2 HEPARIN DW (KG): 76.1  Vital Signs: Temp: 98 F (36.7 C) (09/30 2338) Temp Source: Oral (09/30 2119) BP: 93/69 (10/01 0200) Pulse Rate: 101 (10/01 0200)  Labs: Recent Labs    02/14/24 1132 02/14/24 1147 02/14/24 1544 02/14/24 1545 02/15/24 0110  HGB 11.0* 11.6*  --  10.6* 10.2*  HCT 35.0* 34.0*  --  33.2* 32.7*  PLT 295  --   --  241 288  LABPROT 13.8  --   --   --   --   INR 1.0  --   --   --   --   CREATININE 1.63* 1.60*  --  1.37* 2.01*  TROPONINIHS 52*  --  332*  --  665*    Estimated Creatinine Clearance: 33.9 mL/min (A) (by C-G formula based on SCr of 2.01 mg/dL (H)).   Medical History: Past Medical History:  Diagnosis Date   Hyperlipidemia    Knee pain    Overweight    Reflux     Medications:  Medications Prior to Admission  Medication Sig Dispense Refill Last Dose/Taking   atorvastatin (LIPITOR) 20 MG tablet Take 20 mg by mouth daily.   02/13/2024   cholecalciferol (VITAMIN D) 1000 UNITS tablet Take 1,000 Units by mouth 2 (two) times daily.   02/13/2024   Cyclobenzaprine HCl (FLEXERIL PO) Take 20 mg by mouth daily as needed (muscle spasms).   Past Week   esomeprazole (NEXIUM) 40 MG capsule Take 40 mg by mouth daily at 12 noon.   02/13/2024   esomeprazole (NEXIUM) 40 MG capsule Take 40 mg by mouth daily.   Past Week   fluticasone (FLONASE) 50 MCG/ACT nasal spray Place 1 spray into both nostrils daily.   Past Week   hydrochlorothiazide (HYDRODIURIL) 25 MG tablet Take 25 mg by mouth daily.   Past Week   meloxicam (MOBIC) 15 MG tablet Take 15 mg by mouth daily.   Past Month   pregabalin (LYRICA) 150 MG capsule Take 250 mg by mouth in the morning, at noon, and at  bedtime.   02/13/2024   sertraline (ZOLOFT) 50 MG tablet Take 50 mg by mouth daily.   02/13/2024   traMADol (ULTRAM) 50 MG tablet Take 50 mg by mouth every 12 (twelve) hours as needed for moderate pain (pain score 4-6) or severe pain (pain score 7-10).   Past Week   triamcinolone cream (KENALOG) 0.5 % Apply 1 application topically 3 (three) times daily.   Past Week   ascorbic acid (VITAMIN C) 100 MG tablet Take 1 tablet by mouth daily. (Patient not taking: Reported on 02/14/2024)   Not Taking   cyanocobalamin 1000 MCG tablet Take by mouth. (Patient not taking: Reported on 02/14/2024)   Not Taking   ferrous gluconate (FERGON) 324 MG tablet Take by mouth. (Patient not taking: Reported on 02/14/2024)   Not Taking   ibuprofen (ADVIL,MOTRIN) 200 MG tablet Take 200 mg by mouth every 6 (six) hours as needed. (Patient not taking: Reported on 02/14/2024)   Not Taking   Liraglutide -Weight Management (SAXENDA Mermentau) Inject 30 mg into the skin daily. (Patient not taking: Reported on 02/14/2024)   Not Taking   loratadine (CLARITIN) 10 MG tablet Take 10  mg by mouth daily. (Patient not taking: Reported on 07/31/2020)   Not Taking   methocarbamol (ROBAXIN) 500 MG tablet Take by mouth. (Patient not taking: Reported on 02/14/2024)   Not Taking   Scheduled:   atorvastatin  20 mg Oral QHS   Chlorhexidine Gluconate Cloth  6 each Topical Daily   cholecalciferol  1,000 Units Oral BID   ferrous gluconate  324 mg Oral Q breakfast   pantoprazole  40 mg Oral QAC breakfast   pregabalin  200 mg Oral TID   sertraline  50 mg Oral Daily   Infusions:   sodium chloride 100 mL/hr at 02/15/24 0200   sodium chloride 250 mL (02/15/24 0309)   magnesium sulfate bolus IVPB 2 g (02/15/24 0310)   norepinephrine (LEVOPHED) Adult infusion 7 mcg/min (02/15/24 0200)    Assessment: 63yo female presented to ED c/o syncopal episodes x3 at home then again w/ EMS, found to be hypotensive; in ED troponin was elevated and pt was started on heparin,  which was subsequently d/c'd when cards determined the trop elevated was likely d/t injury 2/2 hypotension and shock; pt remained hypotensive despite aggressive fluid resuscitation; pt was moved to ICU for pressors, CT ordered which reveals extensive bilateral PE w/ significant RHS >> to resume heparin at VTE dosing.  Goal of Therapy:  Heparin level 0.3-0.7 units/ml Monitor platelets by anticoagulation protocol: Yes   Plan:  Heparin 4000 units IV bolus followed by infusion at 1400 units/hr. Monitor heparin levels and CBC.  Marvetta Dauphin, PharmD, BCPS  02/15/2024,3:33 AM

## 2024-02-15 NOTE — Progress Notes (Signed)
 Right femoral sutures removed, followed by hemorrhaging, immediate pressure applied by APP. IR MD called to bedside to place suture, FemStop applied by Dr. Toribio Fuel at roughly 1450 and inflated to a pressure approximately 60 mmHg. Pt has strong doppler pedal pulses.

## 2024-02-15 NOTE — Telephone Encounter (Signed)
 Patient Product/process development scientist completed.    The patient is insured through [employer]. Patient has Medicare and is not eligible for a copay card, but may be able to apply for patient assistance or Medicare RX Payment Plan (Patient Must reach out to their plan, if eligible for payment plan), if available.    Ran test claim for Eliquis 5 mg and the current 30 day co-pay is $284.36 due to a $250.00 deductible.  Will be $47.00 once deductible is met.  Ran test claim for Xarelto 20 mg and the current 30 day co-pay is $284.36 due to a $250.00 deductible.  Will be $47.00 once deductible is met.  This test claim was processed through [facility]- copay amounts may vary at other pharmacies due to pharmacy/plan contracts, or as the patient moves through the different stages of their insurance plan.     Reyes Sharps, CPHT Pharmacy Technician III Certified Patient Advocate [facility] Patient Advocate Team Direct Number: [phone]  Fax: [phone]

## 2024-02-15 NOTE — Progress Notes (Addendum)
 eLink Physician-Brief Progress Note Patient Name: Breanna Riley DOB: [birth year] MRN: [id number]   Date of Service  02/15/2024  HPI/Events of Note  63 y/o female with PMH for OSA, Obseity, HLD who presented with syncope and initially admitted to the IM service but developed refractory hypotension requiring vasopressors.  On examination, the patient is tachycardic, mildly hypotensive, saturating 91% on CPAP.  Norepinephrine infusion in place.  Results consistent with acute kidney injury transaminitis, rising troponin.  CT chest angio pending.  eICU Interventions  CT chest angiogram pending  Worsening AKI, concerning for ATN.  Family wanted to have a discussion about risk/benefits of proceeding with angiography before proceeding.  Okay with proceeding.  Depending on angiography results we will initiate heparin at ACS versus VTE protocols.  DVT prophylaxis with likely therapeutic heparin GI prophylaxis with home PPI   [phone] -alerted by radiology that CT PE is positive for bilateral central pulmonary embolus and RV strain.  Will initiate heparin protocol.  Limited history about bleeding available, does report epistaxis in the past.  Add lower extremity DVT ultrasounds, echo pending.  Technically, qualifies as massive pulmonary embolus with syncope and pressors but need to have a risk/benefit discussion before lytics.  Intervention Category Evaluation Type: New Patient Evaluation  Abdallah Hern 02/15/2024, 2:02 AM

## 2024-02-15 NOTE — Progress Notes (Signed)
 Peripherally Inserted Central Catheter Placement  The IV Nurse has discussed with the patient and/or persons authorized to consent for the patient, the purpose of this procedure and the potential benefits and risks involved with this procedure.  The benefits include less needle sticks, lab draws from the catheter, and the patient may be discharged home with the catheter. Risks include, but not limited to, infection, bleeding, blood clot (thrombus formation), and puncture of an artery; nerve damage and irregular heartbeat and possibility to perform a PICC exchange if needed/ordered by physician.  Alternatives to this procedure were also discussed.  Bard Power PICC patient education guide, fact sheet on infection prevention and patient information card has been provided to patient /or left at bedside.    PICC Placement Documentation  PICC Triple Lumen 02/15/24 Right Cephalic 36 cm 0 cm (Active)  Indication for Insertion or Continuance of Line Vasoactive infusions;Limited venous access - need for IV therapy >5 days (PICC only) 02/15/24 1645  Exposed Catheter (cm) 0 cm 02/15/24 1645  Site Assessment Clean, Dry, Intact 02/15/24 1645  Lumen #1 Status Flushed;Saline locked;Blood return noted 02/15/24 1645  Lumen #2 Status Flushed;Saline locked;Blood return noted 02/15/24 1645  Lumen #3 Status Flushed;Saline locked;Blood return noted 02/15/24 1645  Dressing Type Transparent;Securing device 02/15/24 1645  Dressing Status Antimicrobial disc/dressing in place;Clean, Dry, Intact 02/15/24 1645  Line Care Connections checked and tightened 02/15/24 1645  Dressing Intervention New dressing;Adhesive placed at insertion site (IV team only) 02/15/24 1645  Dressing Change Due 02/22/24 02/15/24 1645       Xee Hollman 02/15/2024, 5:07 PM

## 2024-02-15 NOTE — Sedation Documentation (Signed)
 Left Main Post 64/36 (45)

## 2024-02-15 NOTE — Procedures (Signed)
 Arterial Catheter Insertion Procedure Note  Breanna Riley  [id number]  04-26-1961  Date:02/15/24  Time:3:38 PM    Provider Performing: Leita SAUNDERS Lucan Riner    Procedure: Insertion of Arterial Line (63379) with US  guidance (23062)   Indication(s) Blood pressure monitoring and/or need for frequent ABGs  Consent Risks of the procedure as well as the alternatives and risks of each were explained to the patient and/or caregiver.  Consent for the procedure was obtained and is signed in the bedside chart  Anesthesia 1% lidocaine   Time Out Verified patient identification, verified procedure, site/side was marked, verified correct patient position, special equipment/implants available, medications/allergies/relevant history reviewed, required imaging and test results available.   Sterile Technique Maximal sterile technique including full sterile barrier drape, hand hygiene, sterile gown, sterile gloves, mask, hair covering, sterile ultrasound probe cover (if used).   Procedure Description Area of catheter insertion was cleaned with chlorhexidine and draped in sterile fashion. With real-time ultrasound guidance an arterial catheter was placed into the left radial artery.  Appropriate arterial tracings confirmed on monitor.     Complications/Tolerance None; patient tolerated the procedure well.   EBL Minimal   Specimen(s) None   Leita SAUNDERS Kayson Bullis, PA-C

## 2024-02-15 NOTE — Consult Note (Signed)
 WOC Nurse Consult Note: Reason for Consult: toe wounds Patient from home; several episode of syncope  Wound type: partial thickness wounds in the web spaces and around the toes Pressure Injury POA: NA Measurement: see nrusing flow sheets Wound azi:epwx, clean Drainage (amount, consistency, odor) see nursing flow sheets Periwound:macerated  Dressing procedure/placement/frequency: Cleanse with saline, pat dry Weave silver hydrofiber between toes, for antimicrobial effects and drying of moisture. Change daily. Secure with foam or kerlix. Change daily.    Re consult if needed, will not follow at this time. Thanks  Wah Sabic [employer], RN,CWOCN, CNS, [employer] [phone]

## 2024-02-15 NOTE — Progress Notes (Signed)
 PHARMACY - ANTICOAGULATION CONSULT NOTE  Pharmacy Consult for heparin Indication: pulmonary embolus  Allergies  Allergen Reactions   Naproxen Other (See Comments)    Nose bleeding    Patient Measurements: Height: 4' 11 (149.9 cm) Weight: 127.7 kg (281 lb 8.4 oz) IBW/kg (Calculated) : 43.2 HEPARIN DW (KG): 76.1  Vital Signs: Temp: 98.4 F (36.9 C) (10/01 1505) Temp Source: Oral (10/01 1151) BP: 118/73 (10/01 1500) Pulse Rate: 75 (10/01 1505)  Labs: Recent Labs    02/14/24 1132 02/14/24 1147 02/14/24 1544 02/14/24 1545 02/15/24 0110  HGB 11.0* 11.6*  --  10.6* 10.2*  HCT 35.0* 34.0*  --  33.2* 32.7*  PLT 295  --   --  241 288  LABPROT 13.8  --   --   --   --   INR 1.0  --   --   --   --   CREATININE 1.63* 1.60*  --  1.37* 2.01*  TROPONINIHS 52*  --  332*  --  665*    Estimated Creatinine Clearance: 33.9 mL/min (A) (by C-G formula based on SCr of 2.01 mg/dL (H)).   Medical History: Past Medical History:  Diagnosis Date   Hyperlipidemia    Knee pain    Overweight    Reflux     Medications:  Medications Prior to Admission  Medication Sig Dispense Refill Last Dose/Taking   atorvastatin (LIPITOR) 20 MG tablet Take 20 mg by mouth daily.   02/13/2024   cholecalciferol (VITAMIN D) 1000 UNITS tablet Take 1,000 Units by mouth 2 (two) times daily.   02/13/2024   Cyclobenzaprine HCl (FLEXERIL PO) Take 10 mg by mouth daily as needed (muscle spasms).   Past Week   esomeprazole (NEXIUM) 40 MG capsule Take 40 mg by mouth daily at 12 noon.   02/13/2024   esomeprazole (NEXIUM) 40 MG capsule Take 40 mg by mouth daily.   Past Week   fluticasone (FLONASE) 50 MCG/ACT nasal spray Place 1 spray into both nostrils daily.   Past Week   hydrochlorothiazide (HYDRODIURIL) 25 MG tablet Take 25 mg by mouth daily.   Past Week   meloxicam (MOBIC) 15 MG tablet Take 15 mg by mouth daily as needed for pain.   Past Month   polyethylene glycol (MIRALAX / GLYCOLAX) 17 g packet Take 17 g by mouth  daily as needed for mild constipation.   Past Week   pregabalin (LYRICA) 150 MG capsule Take 200 mg by mouth in the morning, at noon, and at bedtime.   02/13/2024   Semaglutide (RYBELSUS) 3 MG TABS Take 3 mg by mouth daily.   02/13/2024   sertraline (ZOLOFT) 50 MG tablet Take 50 mg by mouth daily.   02/13/2024   traMADol (ULTRAM) 50 MG tablet Take 50 mg by mouth every 12 (twelve) hours as needed for moderate pain (pain score 4-6) or severe pain (pain score 7-10).   Past Week   triamcinolone cream (KENALOG) 0.5 % Apply 1 application topically 3 (three) times daily.   Past Week   ascorbic acid (VITAMIN C) 100 MG tablet Take 1 tablet by mouth daily. (Patient not taking: Reported on 02/15/2024)   Not Taking   cyanocobalamin 1000 MCG tablet Take by mouth. (Patient not taking: Reported on 02/14/2024)   Not Taking   ferrous gluconate (FERGON) 324 MG tablet Take by mouth. (Patient not taking: Reported on 02/14/2024)   Not Taking   ibuprofen (ADVIL,MOTRIN) 200 MG tablet Take 200 mg by mouth every 6 (six) hours  as needed. (Patient not taking: Reported on 02/14/2024)   Not Taking   Liraglutide -Weight Management (SAXENDA [place]) Inject 30 mg into the skin daily. (Patient not taking: Reported on 02/14/2024)   Not Taking   loratadine (CLARITIN) 10 MG tablet Take 10 mg by mouth daily. (Patient not taking: Reported on 07/31/2020)   Not Taking   methocarbamol (ROBAXIN) 500 MG tablet Take by mouth. (Patient not taking: Reported on 02/14/2024)   Not Taking   Scheduled:   atorvastatin  20 mg Oral QHS   Chlorhexidine Gluconate Cloth  6 each Topical Daily   cholecalciferol  1,000 Units Oral BID   fentaNYL       fentaNYL (SUBLIMAZE) injection  25 mcg Intravenous Once   ferrous gluconate  324 mg Oral Q breakfast   lidocaine (PF)       pantoprazole  40 mg Oral QAC breakfast   pregabalin  200 mg Oral TID   sertraline  50 mg Oral Daily   Infusions:   sodium chloride Stopped (02/15/24 0425)   heparin Stopped (02/15/24 1413)    norepinephrine (LEVOPHED) Adult infusion 6 mcg/min (02/15/24 1500)    Assessment: 63yo female presented to ED c/o syncopal episodes x3 at home then again w/ EMS, found to be hypotensive; in ED troponin was elevated and pt was started on heparin, which was subsequently d/c'd when cards determined the trop elevated was likely d/t injury 2/2 hypotension and shock; pt remained hypotensive despite aggressive fluid resuscitation; pt was moved to ICU for pressors, CT ordered which reveals extensive bilateral PE w/ significant RHS >> to resume heparin at VTE dosing.  02/15/24 PM: Patient with significant bleeding with right femoral suture placement. Heparin paused at 14:13. Patient since stabilized. Discussed with CCM, will now resume heparin at prior rate. Noted patient was briefly started on heparin for ACS yesterday at 1800, later stopped at 1900. Patient was rebolused with 4000 units heparin at 0400, then started on heparin 1400 units/hr.   Goal of Therapy:  Heparin level 0.3-0.7 units/ml Monitor platelets by anticoagulation protocol: Yes   Plan:  Restart heparin at 1400 units/hr. - no bolus due to recent bleeding Monitor heparin level in 8 hours Monitor heparin levels, CBC, and s/sx of bleeding daily F/u long term anticoagulation plan  Morna Breach, PharmD PGY2 Cardiology Pharmacy Resident 02/15/2024 3:44 PM

## 2024-02-15 NOTE — Sedation Documentation (Signed)
 ACT 176, results given to Dr. Johann

## 2024-02-15 NOTE — TOC Initial Note (Addendum)
 Transition of Care [facility] - Initial/Assessment Note    Patient Details  Name: Breanna Riley MRN: [id number] Date of Birth: [birth year]  Transition of Care [facility]) CM/SW Contact:    Breanna Riley Phone Number: [phone] 02/15/2024, 12:46 PM  Clinical Narrative: HF CSW met with patient and son at bedside. Patient stated that she lives alone. Patient stated that she has a history of HH services, PT and OT services. Patient stated that she has a cane, walker, and rollator, shower chair, and bedside commode. Patient stated that she is in need of a rollator. Patient does not have a scale at home. Patients son stated that he will purchase one. Patient stated that she has a PCP and her upcoming annual in November coming up. Patients family will provide transportation at dc.   HF CSW/CM will continue to follow and monitor for dc readiness.                         Patient Goals and CMS Choice            Expected Discharge Plan and Services                                              Prior Living Arrangements/Services                       Activities of Daily Living   ADL Screening (condition at time of admission) Independently performs ADLs?: Yes (appropriate for developmental age) Is the patient deaf or have difficulty hearing?: No Does the patient have difficulty seeing, even when wearing glasses/contacts?: No Does the patient have difficulty concentrating, remembering, or making decisions?: No  Permission Sought/Granted                  Emotional Assessment              Admission diagnosis:  Dehydration [E86.0] Hypokalemia [E87.6] Syncope [R55] Syncope, unspecified syncope type [R55] Sepsis, due to unspecified organism, unspecified whether acute organ dysfunction present [facility]) [A41.9] Patient Active Problem List   Diagnosis Date Noted   Syncope and collapse 02/14/2024   Syncope 02/14/2024   PCP:  Breanna Lenis, MD Pharmacy:    [id number] - [place], [place] - [address] [place] [address] Phone: [phone] Fax: [phone]     Social Drivers of Health (SDOH) Social History: SDOH Screenings   Food Insecurity: No Food Insecurity (02/14/2024)  Housing: Low Risk  (02/14/2024)  Transportation Needs: No Transportation Needs (02/14/2024)  Utilities: Not At Risk (02/14/2024)  Financial Resource Strain: Low Risk  (09/19/2023)   Received from [facility]  Social Connections: Moderately Integrated (02/14/2024)  Stress: No Stress Concern Present (12/12/2023)   Received from [facility]  Tobacco Use: Unknown (02/14/2024)   SDOH Interventions:     Readmission Risk Interventions     No data to display

## 2024-02-15 NOTE — Sedation Documentation (Signed)
 ACT 250, results given to Dr. Johann

## 2024-02-15 NOTE — Consult Note (Addendum)
 Advanced Heart Failure Team Consult Note   Primary Physician: Seabron Lenis, MD Cardiologist:  None  Reason for Consultation: Bilateral pulmonary emboli with cor pulmonale HPI:    Breanna Riley is seen today for evaluation of bilateral pulmonary embolism with cor pulmonale at the request of Dr. Sharie.   63 y.o. female with history of obesity, OSA, HLD, osteoarthritis, and spinal stenosis s/p fusions.   No prior cardiac history. Father died of MI in his 8s. Has recently like 30 lbs through the [facility]. Was started of hydrochlorothiazide a few days ago by PCP.   She presented to [facility] 9/30 after three syncopal events, two with family and 1 with EMS. She was hypotensive 50/30 she was given fluids and started on Epi prior to arrival. She did not require intubation. She had been having lightheadedness for 2 day prior, otherwise has been of normal state of health. On arrival to ED, she was responsive with HR 97 and BP 92/69. Code sepsis was called and she was given a total of 5L IVF. CXR showed low lung volumes and cardiomegaly. CT head without acute findings. Cardiology was consult for elevated troponins.   Overnight, developed orthostasis with another syncopal event, she was briefly unresponsive with low BP and given another 1L IVF. She was transferred to [facility] for hypotension and levophed gtt started. She underwent CTPE which was positive for extensive bilateral PE with RV strain. She was urgently taken for thrombectomy and started on heparin. Echo performed, results pending. DVT studies pending.   Today, has not been having chest pain or shortness of breath. No recent illness. No very active at home.   Labs in ED: K 2.9, Mg 1.3, Cr 1.63, hstrop [phone]   Home Medications Prior to Admission medications   Medication Sig Start Date End Date Taking? Authorizing Provider  atorvastatin (LIPITOR) 20 MG tablet Take 20 mg by mouth daily.   Yes [provider]  cholecalciferol  (VITAMIN D) 1000 UNITS tablet Take 1,000 Units by mouth 2 (two) times daily.   Yes [provider]  Cyclobenzaprine HCl (FLEXERIL PO) Take 10 mg by mouth daily as needed (muscle spasms).   Yes [provider]  esomeprazole (NEXIUM) 40 MG capsule Take 40 mg by mouth daily at 12 noon.   Yes [provider]  esomeprazole (NEXIUM) 40 MG capsule Take 40 mg by mouth daily.   Yes [provider]  fluticasone (FLONASE) 50 MCG/ACT nasal spray Place 1 spray into both nostrils daily.   Yes [provider]  hydrochlorothiazide (HYDRODIURIL) 25 MG tablet Take 25 mg by mouth daily. 11/02/23  Yes [provider]  meloxicam (MOBIC) 15 MG tablet Take 15 mg by mouth daily as needed for pain.   Yes [provider]  polyethylene glycol (MIRALAX / GLYCOLAX) 17 g packet Take 17 g by mouth daily as needed for mild constipation.   Yes [provider]  pregabalin (LYRICA) 150 MG capsule Take 200 mg by mouth in the morning, at noon, and at bedtime.   Yes [provider]  Semaglutide (RYBELSUS) 3 MG TABS Take 3 mg by mouth daily.   Yes [provider]  sertraline (ZOLOFT) 50 MG tablet Take 50 mg by mouth daily.   Yes [provider]  traMADol (ULTRAM) 50 MG tablet Take 50 mg by mouth every 12 (twelve) hours as needed for moderate pain (pain score 4-6) or severe pain (pain score 7-10). 12/21/19  Yes [provider]  triamcinolone  cream (KENALOG) 0.5 % Apply 1 application topically 3 (three) times daily.   Yes [provider]  ascorbic acid (VITAMIN C) 100 MG tablet Take 1 tablet by mouth daily. Patient not taking: Reported on 02/15/2024 12/03/19   [provider]  cyanocobalamin 1000 MCG tablet Take by mouth. Patient not taking: Reported on 02/14/2024 12/03/19   [provider]  ferrous gluconate (FERGON) 324 MG tablet Take by mouth. Patient not taking: Reported on 02/14/2024 12/03/19   [provider]  ibuprofen (ADVIL,MOTRIN) 200 MG tablet Take 200 mg by mouth every 6 (six) hours as needed. Patient not taking: Reported on 02/14/2024    [provider]  Liraglutide -Weight Management (SAXENDA Heard) Inject 30 mg into the skin daily. Patient not taking: Reported on 02/14/2024    [provider]  loratadine (CLARITIN) 10 MG tablet Take 10 mg by mouth daily. Patient not taking: Reported on 07/31/2020    [provider]  methocarbamol (ROBAXIN) 500 MG tablet Take by mouth. Patient not taking: Reported on 02/14/2024 11/15/19   [provider]    Past Medical History: Past Medical History:  Diagnosis Date   Hyperlipidemia    Knee pain    Overweight    Reflux     Past Surgical History: History reviewed. No pertinent surgical history.  Family History: History reviewed. No pertinent family history.  Social History: Social History   Socioeconomic History   Marital status: Single    Spouse name: Not on file   Number of children: Not on file   Years of education: Not on file   Highest education level: Not on file  Occupational History   Not on file  Tobacco Use   Smoking status: Never   Smokeless tobacco: Not on file  Substance and Sexual Activity   Alcohol use: Not on file   Drug use: Not on file   Sexual activity: Not on file  Other Topics Concern   Not on file  Social History Narrative   Not on file   Social Drivers of Health   Financial Resource Strain: Low Risk  (09/19/2023)   Received from [facility]   Overall Financial Resource Strain (CARDIA)    Difficulty of Paying Living Expenses: Not hard at all  Food Insecurity: No Food Insecurity (02/14/2024)   Hunger Vital Sign    Worried About Running Out of Food in the Last Year: Never true    Ran Out of Food in the Last Year: Never true  Transportation Needs: No Transportation Needs (02/14/2024)   PRAPARE - Administrator, Civil Service (Medical): No    Lack of  Transportation (Non-Medical): No  Physical Activity: Not on file  Stress: No Stress Concern Present (12/12/2023)   Received from [facility] - Occupational Stress Questionnaire    Do you feel stress - tense, restless, nervous, or anxious, or unable to sleep at night because your mind is troubled all the time - these days?: Only a little  Social Connections: Moderately Integrated (02/14/2024)   Social Connection and Isolation Panel    Frequency of Communication with Friends and Family: More than three times a week    Frequency of Social Gatherings with Friends and Family: More than three times a week    Attends Religious Services: More than 4 times per year    Active Member of [employer] or Organizations: Yes    Attends Banker Meetings: More than 4  times per year    Marital Status: Never married    Allergies:  Allergies  Allergen Reactions   Naproxen Other (See Comments)    Nose bleeding    Objective:    Vital Signs:   Temp:  [97.7 F (36.5 C)-98.1 F (36.7 C)] 98.1 F (36.7 C) (10/01 0351) Pulse Rate:  [76-128] 77 (10/01 1110) Resp:  [9-34] 18 (10/01 1110) BP: (57-151)/(12-119) 114/77 (10/01 1100) SpO2:  [83 %-100 %] 96 % (10/01 1110) FiO2 (%):  [36 %] 36 % (09/30 2003) Weight:  [123.8 kg-127.7 kg] 127.7 kg (09/30 1835) Last BM Date : 02/14/24  Weight change: Filed Weights   02/14/24 1201 02/14/24 1835  Weight: 123.8 kg 127.7 kg   Intake/Output:  Intake/Output Summary (Last 24 hours) at 02/15/2024 1113 Last data filed at 02/15/2024 1000 Gross per 24 hour  Intake 4820.52 ml  Output --  Net 4820.52 ml    Physical Exam    General: Well appearing. No distress on CPAP Cardiac: JVP difficult to assess. S1 and S2 present. No murmurs  Resp: Lung sounds clear and equal B/L Extremities: Warm and dry.  Trace BLE edema.  Neuro: Alert and oriented x3. Affect pleasant.   Telemetry   SR 70s (personally reviewed)  EKG     ST 104 bpm with early R was progression and T wave inversion in V1-V3 (personally reviewed)  Labs   Basic Metabolic Panel: Recent Labs  Lab 02/14/24 1132 02/14/24 1147 02/14/24 1545 02/15/24 0108 02/15/24 0110  NA 140 141  --   --  136  K 2.9* 2.8*  --   --  4.3  CL 104 104  --   --  104  CO2 20*  --   --   --  21*  GLUCOSE 248* 247*  --   --  150*  BUN 15 17  --   --  17  CREATININE 1.63* 1.60* 1.37*  --  2.01*  CALCIUM 9.5  --   --   --  8.6*  MG  --   --  1.3* 1.7  --   PHOS  --   --  3.2 4.6  --     Liver Function Tests: Recent Labs  Lab 02/14/24 1132 02/15/24 0110  AST 57* 73*  ALT 35 41  ALKPHOS 58 66  BILITOT 1.3* 1.1  PROT 6.6 5.9*  ALBUMIN 3.2* 2.8*   Recent Labs  Lab 02/14/24 1132  LIPASE 30   No results for input(s): AMMONIA in the last 168 hours.  CBC: Recent Labs  Lab 02/14/24 1132 02/14/24 1147 02/14/24 1545 02/15/24 0110  WBC 11.6*  --  8.0 9.3  NEUTROABS 8.8*  --   --  7.7  HGB 11.0* 11.6* 10.6* 10.2*  HCT 35.0* 34.0* 33.2* 32.7*  MCV 89.3  --  88.1 89.1  PLT 295  --  241 288   CBG: Recent Labs  Lab 02/14/24 1115 02/14/24 2254 02/15/24 0041  GLUCAP 254* 127* 124*   Coagulation Studies: Recent Labs    02/14/24 1132  LABPROT 13.8  INR 1.0   Medications:    Current Medications:  atorvastatin  20 mg Oral QHS   Chlorhexidine Gluconate Cloth  6 each Topical Daily   cholecalciferol  1,000 Units Oral BID   ferrous gluconate  324 mg Oral Q breakfast   pantoprazole  40 mg Oral QAC breakfast   pregabalin  200 mg Oral TID   sertraline  50 mg Oral Daily  Infusions:  sodium chloride 100 mL/hr at 02/15/24 1000   sodium chloride Stopped (02/15/24 0425)   heparin 1,400 Units/hr (02/15/24 1000)   norepinephrine (LEVOPHED) Adult infusion 6 mcg/min (02/15/24 1000)    Patient Profile   63 y.o. female with history of obesity, OSA, HLD, osteoarthritis, and spinal stenosis s/p fusions.   Assessment/Plan   Bilateral  Pulmonary Embolism with Cor Pulmonale - presented with syncope and lactic acidosis 4.9, now cleared - CTPE + for PE with significant RV strain s/p thrombectomy overnight - echo performed results pending, however on my review RV and IVC dilated, LV function looks ok - DVT studies pending - likely would benefit from ischemic eval, if renal function allows - continue heparin gtt - significantly volume up on exam and IVC; stop IVF and give Lasix 40 mg IV - GDMT limited by renal function  Hypotension - symptomatic - NE for BP support, wean as able  AKI - in the setting of hypotension, RV strain and now contrast - sCr on admit 1.63, up to 2.01 today  Elevated Troponin - [phone] - suspect in the setting of significant PE - repeat ECG today  - on heparin - would likely benefit from ischemic eval, if renal function allows  HLD - continue atorva 20 mg daily  Urinary Retention - place foley   OSA - using home CPAP  Obesity - Body mass index is 56.86 kg/m. - PCP has been working on GLP1 - [facility]  Length of Stay: 1  [place] Lee, NP  02/15/2024, 11:13 AM  Advanced Heart Failure Team Pager [phone] (M-F; 7a - 5p)  Please contact [facility] for night-coverage after hours (4p -7a ) and weekends on [email or web address]   Agree with above  16 y/o woman admitted with recurrent syncope. Initially suspect to be septic but then found to have massive bilateral PE with RV strain.   Underwent emergent thrombectomy c/b venous groin bleed.   Echo EF 50-55% RV with severe HK   On my arrival to bedside I assisted Dr. Karalee in addressing R groin bleed. Pressure held. Sutures placed. Fem stop placed.   Remains on NE 6  General:  Obese woman lying in bed y HEENT: normal Neck: supple. Har to see JVP Cor: Reg tachy Lungs: clear Abdomen: obese soft, nontender, nondistended. No hepatosplenomegaly. No bruits or masses. Good bowel sounds. Extremities: no  cyanosis, clubbing, rash, edema Neuro: alert & orientedx3, cranial nerves grossly intact. moves all 4 extremities w/o difficulty. Affect pleasant  She has massive bilateral PE with severe RV strain. Now s/p thrombectomy   Wean NE slowly as tolerated. Continue supportive care. Will need LE u/s to assess degree of residual leg clot  Continue AC  Watch groin site closely.   Repeat echo later this week.   CRITICAL CARE Performed by: Cherrie Sieving  Total critical care time: 51 minutes  Critical care time was exclusive of separately billable procedures and treating other patients.  Critical care was necessary to treat or prevent imminent or life-threatening deterioration.  Critical care was time spent personally by me (independent of midlevel providers or residents) on the following activities: development of treatment plan with patient and/or surrogate as well as nursing, discussions with consultants, evaluation of patient's response to treatment, examination of patient, obtaining history from patient or surrogate, ordering and performing treatments and interventions, ordering and review of laboratory studies, ordering and review of radiographic studies, pulse oximetry and re-evaluation of patient's condition.  Nahia Nissan,  MD  7:40 PM

## 2024-02-15 NOTE — Consult Note (Signed)
 Chief Complaint: Patient was seen in consultation today for pulmonary emboli  at the request of Breanna Riley  Referring Physician(s): Maree  Supervising Physician: Johann Sieving  Patient Status: [facility] - In-pt  History of Present Illness: Breanna Riley is a 63 y.o. female h/o OSA, HLD, obesity   to the hospital for evaluation of syncope.  Patient had an episode of lightheadedness 2 days ago with change in position but since then she has experienced this multiple times and on .  Had 2 episodes of syncope each lasting less than 1 minute.  She also noted that one of the time she vomited.  Denies any chest pain or shaking.  Denies any previous similar episodes.  Recent 30 pounds of intentional weight loss with the help of wellness clinic over the last 9 months and she was started on HCTZ 25 mg daily few days ago due to lower extremity swelling.   While in EMS patient had 3 further episodes lasting for few seconds.  Patient was noted to be incontinent and hypotensive at that time.  No fluid was available therefore briefly received epinephrine.  She was lethargic upon arrival but responsive.   Initial CT head negative.  Lab work revealed hypokalemia, AKI, lactic acidosis, elevated troponin.  Started on IV fluids, broad-spectrum antibiotics were given. CTA chest shows large occlusive central pulmonary emboli with R heart strain.  Responded well to pressor. On CPAP.  No cancer, ICH, recent surgery, GI ulcer disease, active hemorrhage, or other absolute contraindication to lytics.   Past Medical History:  Diagnosis Date   Hyperlipidemia    Knee pain    Overweight    Reflux     History reviewed. No pertinent surgical history.  Allergies: Naproxen  Medications: Prior to Admission medications   Medication Sig Start Date End Date Taking? Authorizing Provider  atorvastatin (LIPITOR) 20 MG tablet Take 20 mg by mouth daily.   Yes [provider]  cholecalciferol (VITAMIN D) 1000  UNITS tablet Take 1,000 Units by mouth 2 (two) times daily.   Yes [provider]  Cyclobenzaprine HCl (FLEXERIL PO) Take 20 mg by mouth daily as needed (muscle spasms).   Yes [provider]  esomeprazole (NEXIUM) 40 MG capsule Take 40 mg by mouth daily at 12 noon.   Yes [provider]  esomeprazole (NEXIUM) 40 MG capsule Take 40 mg by mouth daily.   Yes [provider]  fluticasone (FLONASE) 50 MCG/ACT nasal spray Place 1 spray into both nostrils daily.   Yes [provider]  hydrochlorothiazide (HYDRODIURIL) 25 MG tablet Take 25 mg by mouth daily. 11/02/23  Yes [provider]  meloxicam (MOBIC) 15 MG tablet Take 15 mg by mouth daily.   Yes [provider]  pregabalin (LYRICA) 150 MG capsule Take 250 mg by mouth in the morning, at noon, and at bedtime.   Yes [provider]  sertraline (ZOLOFT) 50 MG tablet Take 50 mg by mouth daily.   Yes [provider]  traMADol (ULTRAM) 50 MG tablet Take 50 mg by mouth every 12 (twelve) hours as needed for moderate pain (pain score 4-6) or severe pain (pain score 7-10). 12/21/19  Yes [provider]  triamcinolone cream (KENALOG) 0.5 % Apply 1 application topically 3 (three) times daily.   Yes [provider]  ascorbic acid (VITAMIN C) 100 MG tablet Take 1 tablet by mouth daily. Patient not taking: Reported on 02/14/2024 12/03/19   [provider]  cyanocobalamin 1000 MCG  tablet Take by mouth. Patient not taking: Reported on 02/14/2024 12/03/19   [provider]  ferrous gluconate (FERGON) 324 MG tablet Take by mouth. Patient not taking: Reported on 02/14/2024 12/03/19   [provider]  ibuprofen (ADVIL,MOTRIN) 200 MG tablet Take 200 mg by mouth every 6 (six) hours as needed. Patient not taking: Reported on 02/14/2024    [provider]  Liraglutide -Weight Management (SAXENDA [place]) Inject 30 mg into the skin daily. Patient not  taking: Reported on 02/14/2024    [provider]  loratadine (CLARITIN) 10 MG tablet Take 10 mg by mouth daily. Patient not taking: Reported on 07/31/2020    [provider]  methocarbamol (ROBAXIN) 500 MG tablet Take by mouth. Patient not taking: Reported on 02/14/2024 11/15/19   [provider]     History reviewed. No pertinent family history.  Social History   Socioeconomic History   Marital status: Single    Spouse name: Not on file   Number of children: Not on file   Years of education: Not on file   Highest education level: Not on file  Occupational History   Not on file  Tobacco Use   Smoking status: Never   Smokeless tobacco: Not on file  Substance and Sexual Activity   Alcohol use: Not on file   Drug use: Not on file   Sexual activity: Not on file  Other Topics Concern   Not on file  Social History Narrative   Not on file   Social Drivers of Health   Financial Resource Strain: Low Risk  (09/19/2023)   Received from [facility]   Overall Financial Resource Strain (CARDIA)    Difficulty of Paying Living Expenses: Not hard at all  Food Insecurity: No Food Insecurity (02/14/2024)   Hunger Vital Sign    Worried About Running Out of Food in the Last Year: Never true    Ran Out of Food in the Last Year: Never true  Transportation Needs: No Transportation Needs (02/14/2024)   PRAPARE - Administrator, Civil Service (Medical): No    Lack of Transportation (Non-Medical): No  Physical Activity: Not on file  Stress: No Stress Concern Present (12/12/2023)   Received from [facility] - Occupational Stress Questionnaire    Do you feel stress - tense, restless, nervous, or anxious, or unable to sleep at night because your mind is troubled all the time - these days?: Only a little  Social Connections: Moderately Integrated (02/14/2024)   Social Connection and Isolation Panel    Frequency of  Communication with Friends and Family: More than three times a week    Frequency of Social Gatherings with Friends and Family: More than three times a week    Attends Religious Services: More than 4 times per year    Active Member of [employer] or Organizations: Yes    Attends Engineer, structural: More than 4 times per year    Marital Status: Never married    ECOG Status: 4 - Bedbound  Review of Systems: A 12 point ROS discussed and pertinent positives are indicated in the HPI above.  All other systems are negative.  Review of Systems  Vital Signs: BP 93/69   Pulse (!) 101   Temp 98.1 F (36.7 C) (Axillary)   Resp 12   Ht 4' 11 (1.499 m)   Wt 127.7 kg   SpO2 90%   BMI 56.86 kg/m  Physical Exam Constitutional: Oriented to person, place, and time. Obese. Somnolent but arousable and coherent. Last Weight  Most recent update: 02/14/2024  6:37 PM    Weight  127.7 kg (281 lb 8.4 oz)            HENT:  Head: Normocephalic and atraumatic.  Eyes: Conjunctivae and EOM are normal. Right eye exhibits no discharge. Left eye exhibits no discharge. No scleral icterus.  Neck: No JVD present.  Pulmonary/Chest: Effort normal. No stridor. No respiratory distress.  Abdomen: soft, non distended, obese  Neurological:  alert and oriented to person, place, and time.  Skin: Skin is warm and dry.  not diaphoretic.  Psychiatric:  somnolent. Judgment and thought content normal.   Imaging: CT Angio Chest Pulmonary Embolism (PE) W or WO Contrast Result Date: 02/15/2024 EXAM: CTA CHEST 02/15/2024 02:47:09 AM TECHNIQUE: CTA of the chest was performed without and with the administration of 75 mL of intravenous contrast (iohexol (OMNIPAQUE) 350 MG/ML injection). Multiplanar reformatted images are provided for review. MIP images are provided for review. Automated exposure control, iterative reconstruction, and/or weight based adjustment of the mA/kV was utilized to reduce the radiation dose to  as low as reasonably achievable. COMPARISON: Comparison with same day chest radiograph. CLINICAL HISTORY: Syncope/presyncope, cerebrovascular cause suspected. FINDINGS: PULMONARY ARTERIES: Pulmonary arteries are adequately opacified for evaluation. Extensive bilateral pulmonary emboli beginning in the distal right and left main pulmonary arteries and extending into the lobar and segmental branches. MEDIASTINUM: The heart demonstrates flattening of the interventricular septum with dilation of the right ventricle compatible with right heart strain (RV/LV ratio =2). There is no acute abnormality of the thoracic aorta. LYMPH NODES: No mediastinal, hilar or axillary lymphadenopathy. LUNGS AND PLEURA: There are a few scattered ground-glass opacities in the right lower lung favored due to hypoventilation. No evidence of pleural effusion or pneumothorax. UPPER ABDOMEN: Limited images of the upper abdomen are unremarkable. SOFT TISSUES AND BONES: No acute bone or soft tissue abnormality. IMPRESSION: 1. Extensive bilateral pulmonary emboli extending from the distal right and left main pulmonary arteries into lobar and segmental branches with significant right heart strain. RV/LV ratio = 2 CRITICAL VALUE/EMERGENT RESULTS WERE CALLED BY TELEPHONE AT THE TIME OF INTERPRETATION ON 02/15/2024 at 2:59 am TO PROVIDER Dr. Haze, WHO VERBALLY ACKNOWLEDGED THESE RESULTS. Electronically signed by: Norman Gatlin MD 02/15/2024 02:59 AM EDT RP Workstation: [id number]   EEG adult Result Date: 02/14/2024 Shelton Arlin KIDD, MD     02/14/2024  4:52 PM Patient Name: Breanna Riley MRN: [id number] Epilepsy Attending: Arlin KIDD Shelton Referring Physician/Provider: Caleen Burgess BROCKS, MD Date: 02/14/2024 Duration: 21.57 mins Patient history: 63yo F with syncope. EEG to evaluate for seizure Level of alertness: Awake AEDs during EEG study: PGB Technical aspects: This EEG study was done with scalp electrodes positioned according to the 10-20 International  system of electrode placement. Electrical activity was reviewed with band pass filter of 1-70Hz , sensitivity of 7 uV/mm, display speed of 70mm/sec with a 60Hz  notched filter applied as appropriate. EEG data were recorded continuously and digitally stored.  Video monitoring was available and reviewed as appropriate. Description: The posterior dominant rhythm consists of 8-9 Hz activity of moderate voltage (25-35 uV) seen predominantly in posterior head regions, symmetric and reactive to eye opening and eye closing. Hyperventilation and photic stimulation were not performed.   IMPRESSION: This study is within normal limits. No seizures or epileptiform discharges were seen throughout the recording. A normal interictal EEG does not exclude the diagnosis of epilepsy.  Priyanka O Yadav   CT Head Wo Contrast Result Date: 02/14/2024 CLINICAL DATA:  Fall, altered mental status EXAM: CT HEAD WITHOUT CONTRAST TECHNIQUE: Contiguous axial images were obtained from the base of the skull through the vertex without intravenous contrast. RADIATION DOSE REDUCTION: This exam was performed according to the departmental dose-optimization program which includes automated exposure control, adjustment of the mA and/or kV according to patient size and/or use of iterative reconstruction technique. COMPARISON:  None Available. FINDINGS: Brain: Gray-white matter differentiation is preserved. Nonspecific mild periventricular and subcortical white matter hypodensities, likely secondary to chronic microvascular disease. No acute intracranial hemorrhage. No hydrocephalus. No midline shift. Basal cisterns are patent. Vascular: Unremarkable. Skull: No acute findings. Sinuses/Orbits: No acute finding. Other: None. IMPRESSION: No acute intracranial pathology. Electronically Signed   By: Michaeline Blanch M.D.   On: 02/14/2024 12:04   DG Chest Port 1 View Result Date: 02/14/2024 CLINICAL DATA:  Hypotensive, syncope, fall EXAM: PORTABLE CHEST 1 VIEW  COMPARISON:  December 14, 2020 FINDINGS: Low lung volumes and slight patient rotation, limiting evaluation. No definite consolidations. No pleural effusions. No pneumothorax. Prominent cardiomediastinal silhouette, likely exaggerated by low lung volumes and AP technique. No acute osseous findings. IMPRESSION: Limited examination.  Hypoinflation.  No definite acute findings. Electronically Signed   By: Michaeline Blanch M.D.   On: 02/14/2024 12:01    Labs:  CBC: Recent Labs    02/14/24 1132 02/14/24 1147 02/14/24 1545 02/15/24 0110  WBC 11.6*  --  8.0 9.3  HGB 11.0* 11.6* 10.6* 10.2*  HCT 35.0* 34.0* 33.2* 32.7*  PLT 295  --  241 288    COAGS: Recent Labs    02/14/24 1132  INR 1.0    BMP: Recent Labs    02/14/24 1132 02/14/24 1147 02/14/24 1545 02/15/24 0110  NA 140 141  --  136  K 2.9* 2.8*  --  4.3  CL 104 104  --  104  CO2 20*  --   --  21*  GLUCOSE 248* 247*  --  150*  BUN 15 17  --  17  CALCIUM 9.5  --   --  8.6*  CREATININE 1.63* 1.60* 1.37* 2.01*  GFRNONAA 35*  --  43* 27*    LIVER FUNCTION TESTS: Recent Labs    02/14/24 1132 02/15/24 0110  BILITOT 1.3* 1.1  AST 57* 73*  ALT 35 41  ALKPHOS 58 66  PROT 6.6 5.9*  ALBUMIN 3.2* 2.8*    TUMOR MARKERS: No results for input(s): AFPTM, CEA, CA199, CHROMGRNA in the last 8760 hours.  Assessment and Plan:  Large central occlusive pulmonary emboli, stable on pressors. Would be candidate for lytics or pulmonary artery thrombectomy. Discussed with patient and son the PE thrombectomy procedure via CFV or internal jugular, anticipated benefits, possible risks and complications, and alternatives. DIscussed lytics with Dr Maree who encouraged mechanical thrombectomy given her  relatively stable situation currently on pressors.  We  will plan to proceed with bilateral PE thrombectomy via CFV (or internal jugular if extensive LE DVT).  Pt and son consent to proceed.   Thank you for this interesting consult.  I greatly  enjoyed meeting Deborahann Poteat and look forward to participating in their care.  A copy of this report was sent to the requesting provider on this date.  Electronically Signed: Dayne Lamarion Mcevers, MD 02/15/2024, 4:53 AM   I spent a total of 40 Minutes    in face to face in clinical consultation, greater than 50% of  which was counseling/coordinating care for acute bilateral central pulmonary emboli.

## 2024-02-15 NOTE — Progress Notes (Signed)
 OT Cancellation Note  Patient Details Name: Breanna Riley MRN: [id number] DOB: 1960-07-29   Cancelled Treatment:    Reason Eval/Treat Not Completed: Medical issues which prohibited therapy (pt with bil PE with RV strain, not yet been on anticoagulation x24 hours per therapy protocol. Also with active bedrest order. Will follow up as appropriate for OT evaluation.)  Abdulaziz Toman K, OTD, OTR/L SecureChat [facility] [phone]   [name] 02/15/2024, 7:22 AM

## 2024-02-15 NOTE — Consult Note (Signed)
 NAME:  Breanna Riley, MRN:  [id number], DOB:  1960/07/29, LOS: 1 ADMISSION DATE:  02/14/2024, CONSULTATION DATE:  02/15/2024 REFERRING MD:  Redia Cleaver, MD, CHIEF COMPLAINT:  Hypotension  History of Present Illness:  63 y/o female with PMH for OSA, Obseity, HLD  who presented with syncope and was admitted to Hospitalist service.  She has lost 30 lbs with help of [facility] and was started on hydrochlorothiazide 25 mg a few days ago.  She started with Lightheadedness 2 days ago and then had 2 episodes of syncope last about 1 min.  Lab work on admission showing AKI and increased LA.  She was started on IV fluids and she has been given 5 liters and still remains hypotensive.  She was started on Levophed.  Pertinent  Medical History  OSA, Obseity, HLD    Significant Hospital Events: Including procedures, antibiotic start and stop dates in addition to other pertinent events   10/1: transfer to ICU  Interim History / Subjective:  N/a  Objective    Blood pressure 121/65, pulse (!) 104, temperature 98 F (36.7 C), resp. rate 16, height 4' 11 (1.499 m), weight 127.7 kg, SpO2 100%.    FiO2 (%):  [36 %] 36 %   Intake/Output Summary (Last 24 hours) at 02/15/2024 0022 Last data filed at 02/14/2024 2133 Gross per 24 hour  Intake 2740 ml  Output --  Net 2740 ml   Filed Weights   02/14/24 1201 02/14/24 1835  Weight: 123.8 kg 127.7 kg    Examination: General: awake and alert on CPAP with 4 liters O2, NAD HENT: PERRLA no icterus Lungs: CTA no wheezes no rales Cardiovascular: Reg s1s2 no murmurs Abdomen: soft obese, NT ND BS pos Extremities: cool to touch, mild LE edema Neuro: AAO x 3, CN II to XII grossly intact GU: n/a  Resolved problem list   Assessment and Plan  Hypotension must r/o Pulmonary emboli Continue with levophed Does not seem septic Syncope Orthostatic IV fluids Albumin IV vasopressors AKI Secondary to hypotension Improving Continue to  monitor SIRS/LA Improving with IV fluids Elevated Troponin-NSTEMI Has been trending up Will place patient on Heparin GERD PPI OSA Currently on CPAP Normocytic Anemia No overt signs of acute blood loss Depression Zoloft, Pregabalin   Labs   CBC: Recent Labs  Lab 02/14/24 1132 02/14/24 1147 02/14/24 1545  WBC 11.6*  --  8.0  NEUTROABS 8.8*  --   --   HGB 11.0* 11.6* 10.6*  HCT 35.0* 34.0* 33.2*  MCV 89.3  --  88.1  PLT 295  --  241    Basic Metabolic Panel: Recent Labs  Lab 02/14/24 1132 02/14/24 1147 02/14/24 1545  NA 140 141  --   K 2.9* 2.8*  --   CL 104 104  --   CO2 20*  --   --   GLUCOSE 248* 247*  --   BUN 15 17  --   CREATININE 1.63* 1.60* 1.37*  CALCIUM 9.5  --   --   MG  --   --  1.3*  PHOS  --   --  3.2   GFR: Estimated Creatinine Clearance: 49.8 mL/min (A) (by C-G formula based on SCr of 1.37 mg/dL (H)). Recent Labs  Lab 02/14/24 1132 02/14/24 1147 02/14/24 1537 02/14/24 1544 02/14/24 1545  PROCALCITON  --   --   --  0.38  --   WBC 11.6*  --   --   --  8.0  LATICACIDVEN  --  4.9* 2.1*  --   --     Liver Function Tests: Recent Labs  Lab 02/14/24 1132  AST 57*  ALT 35  ALKPHOS 58  BILITOT 1.3*  PROT 6.6  ALBUMIN 3.2*   Recent Labs  Lab 02/14/24 1132  LIPASE 30   No results for input(s): AMMONIA in the last 168 hours.  ABG    Component Value Date/Time   TCO2 22 02/14/2024 1147     Coagulation Profile: Recent Labs  Lab 02/14/24 1132  INR 1.0    Cardiac Enzymes: No results for input(s): CKTOTAL, CKMB, CKMBINDEX, TROPONINI in the last 168 hours.  HbA1C: No results found for: HGBA1C  CBG: Recent Labs  Lab 02/14/24 1115 02/14/24 2254  GLUCAP 254* 127*    Review of Systems:   No chest pain, no n/v/d, no abd pain  Past Medical History:  She,  has a past medical history of Hyperlipidemia, Knee pain, Overweight, and Reflux.   Surgical History:  History reviewed. No pertinent surgical history.    Social History:   reports that she has never smoked. She does not have any smokeless tobacco history on file.   Family History:  Her family history is not on file.   Allergies Allergies  Allergen Reactions   Naproxen Other (See Comments)    Nose bleeding     Home Medications  Prior to Admission medications   Medication Sig Start Date End Date Taking? Authorizing Provider  atorvastatin (LIPITOR) 20 MG tablet Take 20 mg by mouth daily.   Yes [provider]  cholecalciferol (VITAMIN D) 1000 UNITS tablet Take 1,000 Units by mouth 2 (two) times daily.   Yes [provider]  Cyclobenzaprine HCl (FLEXERIL PO) Take 20 mg by mouth daily as needed (muscle spasms).   Yes [provider]  esomeprazole (NEXIUM) 40 MG capsule Take 40 mg by mouth daily at 12 noon.   Yes [provider]  esomeprazole (NEXIUM) 40 MG capsule Take 40 mg by mouth daily.   Yes [provider]  fluticasone (FLONASE) 50 MCG/ACT nasal spray Place 1 spray into both nostrils daily.   Yes [provider]  hydrochlorothiazide (HYDRODIURIL) 25 MG tablet Take 25 mg by mouth daily. 11/02/23  Yes [provider]  meloxicam (MOBIC) 15 MG tablet Take 15 mg by mouth daily.   Yes [provider]  pregabalin (LYRICA) 150 MG capsule Take 250 mg by mouth in the morning, at noon, and at bedtime.   Yes [provider]  sertraline (ZOLOFT) 50 MG tablet Take 50 mg by mouth daily.   Yes [provider]  traMADol (ULTRAM) 50 MG tablet Take 50 mg by mouth every 12 (twelve) hours as needed for moderate pain (pain score 4-6) or severe pain (pain score 7-10). 12/21/19  Yes [provider]  triamcinolone cream (KENALOG) 0.5 % Apply 1 application topically 3 (three) times daily.   Yes [provider]  ascorbic acid (VITAMIN C) 100 MG tablet Take 1 tablet by mouth daily. Patient not taking: Reported on 02/14/2024 12/03/19   [provider]   cyanocobalamin 1000 MCG tablet Take by mouth. Patient not taking: Reported on 02/14/2024 12/03/19   [provider]  ferrous gluconate (FERGON) 324 MG tablet Take by mouth. Patient not taking: Reported on 02/14/2024 12/03/19   [provider]  ibuprofen (ADVIL,MOTRIN) 200 MG tablet Take 200 mg by mouth every 6 (six) hours as needed. Patient not taking: Reported on 02/14/2024    [provider]  Liraglutide -Weight Management (SAXENDA [place]) Inject 30 mg into the skin daily. Patient not taking: Reported on 02/14/2024    [provider]  loratadine (CLARITIN) 10 MG tablet Take 10 mg by mouth daily. Patient not taking: Reported on 07/31/2020    [provider]  methocarbamol (ROBAXIN) 500 MG tablet Take by mouth. Patient not taking: Reported on 02/14/2024 11/15/19   [provider]     Critical care time: 35   The patient is critically ill with multiple organ system failure and requires high complexity decision making for assessment and support, frequent evaluation and titration of therapies, advanced monitoring, review of radiographic studies and interpretation of complex data.   Critical Care Time devoted to patient care services, exclusive of separately billable procedures, described in this note is 35 minutes.   Orlin Fairly, MD [facility] See Amion for pager  If no response to pager , please call [phone] until 7pm After 7:00 pm call Elink  [phone] 02/15/2024, 12:22 AM\

## 2024-02-15 NOTE — Progress Notes (Signed)
   02/15/24 2148  BiPAP/CPAP/SIPAP  BiPAP/CPAP/SIPAP Pt Type Adult  Mask Type Nasal pillows  FiO2 (%) 36 %  Flow Rate 4 lpm  Patient Home Machine Yes  Safety Check Completed by RT for Home Unit Yes, no issues noted  Patient Home Mask Yes  Patient Home Tubing Yes  Device Plugged into RED Power Outlet Yes  BiPAP/CPAP /SiPAP Vitals  Resp 10  Bilateral Breath Sounds Diminished

## 2024-02-15 NOTE — Procedures (Signed)
  Procedure:  Bilateral pulmonary artery thrombectomy via R CFV Preprocedure diagnosis: The primary encounter diagnosis was Sepsis, due to unspecified organism, unspecified whether acute organ dysfunction present (HCC). Diagnoses of Syncope, unspecified syncope type, Dehydration, and Hypokalemia were also pertinent to this visit. Submassive PE.  Postprocedure diagnosis: same EBL:    minimal Complications:   none immediate  See full dictation in [employer].  CHARM Breanna Faes MD Main # [phone] Pager  [phone] Mobile [phone]

## 2024-02-16 ENCOUNTER — Inpatient Hospital Stay (HOSPITAL_COMMUNITY)

## 2024-02-16 DIAGNOSIS — I2699 Other pulmonary embolism without acute cor pulmonale: Secondary | ICD-10-CM | POA: Diagnosis not present

## 2024-02-16 DIAGNOSIS — N179 Acute kidney failure, unspecified: Secondary | ICD-10-CM | POA: Diagnosis not present

## 2024-02-16 DIAGNOSIS — M79671 Pain in right foot: Secondary | ICD-10-CM | POA: Diagnosis not present

## 2024-02-16 DIAGNOSIS — R7989 Other specified abnormal findings of blood chemistry: Secondary | ICD-10-CM | POA: Diagnosis not present

## 2024-02-16 DIAGNOSIS — R55 Syncope and collapse: Secondary | ICD-10-CM

## 2024-02-16 DIAGNOSIS — Z86711 Personal history of pulmonary embolism: Secondary | ICD-10-CM | POA: Diagnosis not present

## 2024-02-16 DIAGNOSIS — R600 Localized edema: Secondary | ICD-10-CM | POA: Diagnosis not present

## 2024-02-16 DIAGNOSIS — I2609 Other pulmonary embolism with acute cor pulmonale: Secondary | ICD-10-CM | POA: Diagnosis not present

## 2024-02-16 DIAGNOSIS — I2694 Multiple subsegmental pulmonary emboli without acute cor pulmonale: Secondary | ICD-10-CM

## 2024-02-16 LAB — BASIC METABOLIC PANEL WITH GFR
Anion gap: 10 (ref 5–15)
BUN: 16 mg/dL (ref 8–23)
CO2: 23 mmol/L (ref 22–32)
Calcium: 8.2 mg/dL — ABNORMAL LOW (ref 8.9–10.3)
Chloride: 99 mmol/L (ref 98–111)
Creatinine, Ser: 1.75 mg/dL — ABNORMAL HIGH (ref 0.44–1.00)
GFR, Estimated: 32 mL/min — ABNORMAL LOW (ref >60)
Glucose, Bld: 172 mg/dL — ABNORMAL HIGH (ref 70–99)
Potassium: 3.7 mmol/L (ref 3.5–5.1)
Sodium: 132 mmol/L — ABNORMAL LOW (ref 135–145)

## 2024-02-16 LAB — CBC
HCT: 27.3 % — ABNORMAL LOW (ref 36.0–46.0)
Hemoglobin: 8.5 g/dL — ABNORMAL LOW (ref 12.0–15.0)
MCH: 27.7 pg (ref 26.0–34.0)
MCHC: 31.1 g/dL (ref 30.0–36.0)
MCV: 88.9 fL (ref 80.0–100.0)
Platelets: 265 K/uL (ref 150–400)
RBC: 3.07 MIL/uL — ABNORMAL LOW (ref 3.87–5.11)
RDW: 14 % (ref 11.5–15.5)
WBC: 9 K/uL (ref 4.0–10.5)
nRBC: 0.2 % (ref 0.0–0.2)

## 2024-02-16 LAB — COOXEMETRY PANEL
Carboxyhemoglobin: 0.8 % (ref 0.5–1.5)
Methemoglobin: 1.3 % (ref 0.0–1.5)
O2 Saturation: 63.1 %
Total hemoglobin: 9 g/dL — ABNORMAL LOW (ref 12.0–16.0)

## 2024-02-16 LAB — HEPARIN LEVEL (UNFRACTIONATED)
Heparin Unfractionated: >1.1 [IU]/mL — ABNORMAL HIGH (ref 0.30–0.70)
Heparin Unfractionated: 0.88 [IU]/mL — ABNORMAL HIGH (ref 0.30–0.70)
Heparin Unfractionated: 0.52 [IU]/mL (ref 0.30–0.70)

## 2024-02-16 LAB — MAGNESIUM: Magnesium: 1.6 mg/dL — ABNORMAL LOW (ref 1.7–2.4)

## 2024-02-16 MED ORDER — METOLAZONE 2.5 MG PO TABS
2.5000 mg | ORAL_TABLET | Freq: Once | ORAL | Status: AC
  Administered 2024-02-16: 2.5 mg via ORAL
  Filled 2024-02-16: qty 1

## 2024-02-16 MED ORDER — NOREPINEPHRINE 16 MG/250ML-% IV SOLN
0.0000 ug/min | INTRAVENOUS | Status: DC
  Administered 2024-02-16: 10 ug/min via INTRAVENOUS
  Filled 2024-02-16: qty 250

## 2024-02-16 MED ORDER — HEPARIN (PORCINE) 25000 UT/250ML-% IV SOLN
1150.0000 [IU]/h | INTRAVENOUS | Status: AC
  Administered 2024-02-16: 1250 [IU]/h via INTRAVENOUS
  Administered 2024-02-16: 1050 [IU]/h via INTRAVENOUS
  Filled 2024-02-16: qty 250

## 2024-02-16 MED ORDER — FUROSEMIDE 10 MG/ML IJ SOLN
40.0000 mg | Freq: Two times a day (BID) | INTRAMUSCULAR | Status: DC
Start: 2024-02-16 — End: 2024-02-17
  Administered 2024-02-16 – 2024-02-17 (×3): 40 mg via INTRAVENOUS
  Filled 2024-02-16 (×3): qty 4

## 2024-02-16 MED ORDER — POTASSIUM CHLORIDE CRYS ER 10 MEQ PO TBCR
30.0000 meq | EXTENDED_RELEASE_TABLET | ORAL | Status: AC
  Administered 2024-02-16 (×2): 30 meq via ORAL
  Filled 2024-02-16 (×2): qty 1

## 2024-02-16 MED ORDER — CYCLOBENZAPRINE HCL 10 MG PO TABS
10.0000 mg | ORAL_TABLET | Freq: Every day | ORAL | Status: DC
  Administered 2024-02-16 – 2024-02-27 (×12): 10 mg via ORAL
  Filled 2024-02-16 (×12): qty 1

## 2024-02-16 MED ORDER — MAGNESIUM SULFATE 4 GM/100ML IV SOLN
4.0000 g | Freq: Once | INTRAVENOUS | Status: AC
  Administered 2024-02-16: 4 g via INTRAVENOUS
  Filled 2024-02-16: qty 100

## 2024-02-16 MED ORDER — CARMEX CLASSIC LIP BALM EX OINT
1.0000 | TOPICAL_OINTMENT | CUTANEOUS | Status: DC | PRN
Start: 2024-02-16 — End: 2024-02-28

## 2024-02-16 MED ORDER — SENNOSIDES-DOCUSATE SODIUM 8.6-50 MG PO TABS
2.0000 | ORAL_TABLET | Freq: Every day | ORAL | Status: DC
  Administered 2024-02-16 – 2024-02-17 (×2): 2 via ORAL
  Filled 2024-02-16 (×2): qty 2

## 2024-02-16 MED ORDER — ALUM & MAG HYDROXIDE-SIMETH 200-200-20 MG/5ML PO SUSP
30.0000 mL | ORAL | Status: DC | PRN
Start: 2024-02-16 — End: 2024-02-28
  Administered 2024-02-16 – 2024-02-24 (×5): 30 mL via ORAL
  Filled 2024-02-16 (×5): qty 30

## 2024-02-16 NOTE — Plan of Care (Signed)
 Progressing Add All Education: Knowledge of General Education information will improve Add Today at 2041 - Progressing by Rolfe Corean HERO, RN Add Health Behavior/Discharge Planning: Ability to manage health-related needs will improve Add Today at 2041 - Progressing by Rolfe Corean HERO, RN Add Clinical Measurements: Ability to maintain clinical measurements within normal limits will improve Add Today at 2041 - Progressing by Rolfe Corean HERO, RN Add Will remain free from infection Add Today at 2041 - Progressing by Rolfe Corean HERO, RN Add Diagnostic test results will improve Add Today at 2041 - Progressing by Rolfe Corean HERO, RN Add Respiratory complications will improve Add Today at 2041 - Progressing by Rolfe Corean HERO, RN Add Cardiovascular complication will be avoided Add Today at 2041 - Progressing by Rolfe Corean HERO, RN Add Activity: Risk for activity intolerance will decrease Add Today at 2041 - Progressing by Rolfe Corean HERO, RN Add Nutrition: Adequate nutrition will be maintained Add Today at 2041 - Progressing by Rolfe Corean HERO, RN Add Coping: Level of anxiety will decrease Add Today at 2041 - Progressing by Rolfe Corean HERO, RN Add Elimination: Will not experience complications related to bowel motility Add Today at 2041 - Progressing by Rolfe Corean HERO, RN Add Will not experience complications related to urinary retention Add Today at 2041 - Progressing by Rolfe Corean HERO, RN Add Pain Managment: General experience of comfort will improve and/or be controlled Add Today at 2041 - Progressing by Rolfe Corean HERO, RN Add Safety: Ability to remain free from injury will improve Add Today at 2041 - Progressing by Rolfe Corean HERO, RN Add Skin Integrity: Risk for impaired skin integrity will decrease Add Today at 2041 - Progressing by Rolfe Corean HERO, RN Add Education: Understanding of CV disease, CV risk reduction, and recovery process will improve Add Today at 2041 - Progressing by Rolfe Corean HERO, RN Add Individualized Educational Video(s) Add Today at 2041 - Progressing by Rolfe Corean HERO, RN Add Activity: Ability to return to baseline activity level will improve Add Today at 2041 - Progressing by Rolfe Corean HERO, RN Add Cardiovascular: Ability to achieve and maintain adequate cardiovascular perfusion will improve Add Today at 2041 - Progressing by Rolfe Corean HERO, RN Add Vascular access site(s) Level 0-1 will be maintained Add Today at 2041 - Progressing by Rolfe Corean HERO, RN Add Health Behavior/Discharge Planning: Ability to safely manage health-related needs after discharge will improve Add Today at 2041 - Progressing by Rolfe Corean HERO, RN

## 2024-02-16 NOTE — Progress Notes (Signed)
 NAME:  Breanna Riley, MRN:  [id number], DOB:  [birth year], LOS: 2 ADMISSION DATE:  02/14/2024, CONSULTATION DATE:  02/15/2024 REFERRING MD:  Redia Cleaver, MD, CHIEF COMPLAINT:  Hypotension  History of Present Illness:  63 y/o female with PMH for OSA, Obseity, HLD  who presented with syncope and was admitted to Hospitalist service.  She has [facility] 30 [facility] and was started on hydrochlorothiazide 25 mg a few days ago.  She started with Lightheadedness 2 days ago and then had 2 episodes of syncope last about 1 min.  Lab work on admission showing AKI and increased LA.  She was started on IV fluids and she has been given 5 liters and still remains hypotensive.  She was started on Levophed.  Pertinent  Medical History  OSA, Obseity, HLD    Significant Hospital Events: Including procedures, antibiotic start and stop dates in addition to other pertinent events   10/1: transfer to ICU, s/p thrombectomy, requiring norepi 10/2 stable but still on levo 10mcg  Interim History / Subjective:  Echo with continued RH strain Feels clinically better this AM Remains on Levo 10mcg R foot still hurting, thinks she may have hit it when she fell PTA   Objective    Blood pressure 118/73, pulse 84, temperature 99.3 F (37.4 C), resp. rate 15, height 4' 11 (1.499 m), weight 127.7 kg, SpO2 99%. CVP:  [9 mmHg-44 mmHg] 12 mmHg  FiO2 (%):  [36 %] 36 %   Intake/Output Summary (Last 24 hours) at 02/16/2024 1017 Last data filed at 02/16/2024 1011 Gross per 24 hour  Intake 2200.44 ml  Output 4820 ml  Net -2619.56 ml   Filed Weights   02/14/24 1201 02/14/24 1835  Weight: 123.8 kg 127.7 kg    General:  well nourished F resting in bed in NAD HEENT: MM pink/moist, sclera anicteric  Neuro: alert and oriented, moving all extremities CV: s1s2 rrr, no m/r/g PULM:  clear bilaterally on home bipap without respiratory distress GI: soft, non-distended and non-tender  Extremities:  warm/dry, trace LE edema     Resolved problem list   Assessment and Plan   Syncope and obstructive shock secondary to extensive bilateral Pulmonary Emboli with cor pulmonale and acute hypoxic respiratory failure  S/p  successful thrombectomy -echo done and results pending -remains on norepi , continue to maintain MAP >65 -d/w with HF team, likely needs more time for RV recovery, continue diuresis -ischemic w/u if renal function allows  -continue heparin gtt today -LE dopplers pending -no clear inciting factor, has been taking Rybelsus for approximately three months, discussed with pharmacy and there have been some case reports of DVT/PE    AKI Baseline <1.0, 2.01>1.8>1.75 -responded well to diuresis with 4.6L out -follow renal indices  -ensure renal perfusion and avoid nephrotoxins    GERD -PPI  OSA -CPAP qhs  Depression/Anxiety -continue zoloft and lyrica with prn hydroxyzine and trazodone at bedtime    R foot pain -plain films   Labs   CBC: Recent Labs  Lab 02/14/24 1132 02/14/24 1147 02/14/24 1545 02/15/24 0110 02/15/24 1600 02/16/24 0500  WBC 11.6*  --  8.0 9.3 8.7 9.0  NEUTROABS 8.8*  --   --  7.7  --   --   HGB 11.0* 11.6* 10.6* 10.2* 8.9* 8.5*  HCT 35.0* 34.0* 33.2* 32.7* 28.0* 27.3*  MCV 89.3  --  88.1 89.1 87.8 88.9  PLT 295  --  241 288 237 265  Basic Metabolic Panel: Recent Labs  Lab 02/14/24 1132 02/14/24 1147 02/14/24 1545 02/15/24 0108 02/15/24 0110 02/15/24 1600 02/16/24 0500  NA 140 141  --   --  136 134* 132*  K 2.9* 2.8*  --   --  4.3 3.8 3.7  CL 104 104  --   --  104 104 99  CO2 20*  --   --   --  21* 22 23  GLUCOSE 248* 247*  --   --  150* 108* 172*  BUN 15 17  --   --  17 18 16   CREATININE 1.63* 1.60* 1.37*  --  2.01* 1.81* 1.75*  CALCIUM 9.5  --   --   --  8.6* 8.1* 8.2*  MG  --   --  1.3* 1.7  --  1.7 1.6*  PHOS  --   --  3.2 4.6  --   --   --    GFR: Estimated Creatinine Clearance: 39 mL/min (A) (by C-G  formula based on SCr of 1.75 mg/dL (H)). Recent Labs  Lab 02/14/24 1147 02/14/24 1537 02/14/24 1544 02/14/24 1545 02/15/24 0110 02/15/24 1600 02/16/24 0500  PROCALCITON  --   --  0.38  --   --   --   --   WBC  --   --   --  8.0 9.3 8.7 9.0  LATICACIDVEN 4.9* 2.1*  --   --  1.8 1.0  --     Liver Function Tests: Recent Labs  Lab 02/14/24 1132 02/15/24 0110  AST 57* 73*  ALT 35 41  ALKPHOS 58 66  BILITOT 1.3* 1.1  PROT 6.6 5.9*  ALBUMIN 3.2* 2.8*   Recent Labs  Lab 02/14/24 1132  LIPASE 30   No results for input(s): AMMONIA in the last 168 hours.  ABG    Component Value Date/Time   TCO2 22 02/14/2024 1147   O2SAT 63.1 02/16/2024 0455     Coagulation Profile: Recent Labs  Lab 02/14/24 1132  INR 1.0    Cardiac Enzymes: No results for input(s): CKTOTAL, CKMB, CKMBINDEX, TROPONINI in the last 168 hours.  HbA1C: No results found for: HGBA1C  CBG: Recent Labs  Lab 02/14/24 1115 02/14/24 2254 02/15/24 0041 02/15/24 1149  GLUCAP 254* 127* 124* 99    Review of Systems:   No chest pain, no n/v/d, no abd pain  Past Medical History:  She,  has a past medical history of Hyperlipidemia, Knee pain, Overweight, and Reflux.   Surgical History:   Past Surgical History:  Procedure Laterality Date   IR ANGIOGRAM PULMONARY BILATERAL SELECTIVE  02/15/2024   IR ANGIOGRAM SELECTIVE EACH ADDITIONAL VESSEL  02/15/2024   IR ANGIOGRAM SELECTIVE EACH ADDITIONAL VESSEL  02/15/2024   IR THROMBECT PRIM MECH ADD (INCLU) MOD SED  02/15/2024   IR THROMBECT PRIM MECH ADD (INCLU) MOD SED  02/15/2024   IR THROMBECT PRIM MECH ADD (INCLU) MOD SED  02/15/2024   IR THROMBECT PRIM MECH INIT (INCLU) MOD SED  02/15/2024   IR US  GUIDE VASC ACCESS RIGHT  02/15/2024     Social History:   reports that she has never smoked. She does not have any smokeless tobacco history on file.   Family History:  Her family history is not on file.   Allergies Allergies  Allergen Reactions    Naproxen Other (See Comments)    Nose bleeding     Home Medications  Prior to Admission medications   Medication Sig Start Date End Date  Taking? Authorizing Provider  atorvastatin (LIPITOR) 20 MG tablet Take 20 mg by mouth daily.   Yes [provider]  cholecalciferol (VITAMIN D) 1000 UNITS tablet Take 1,000 Units by mouth 2 (two) times daily.   Yes [provider]  Cyclobenzaprine HCl (FLEXERIL PO) Take 20 mg by mouth daily as needed (muscle spasms).   Yes [provider]  esomeprazole (NEXIUM) 40 MG capsule Take 40 mg by mouth daily at 12 noon.   Yes [provider]  esomeprazole (NEXIUM) 40 MG capsule Take 40 mg by mouth daily.   Yes [provider]  fluticasone (FLONASE) 50 MCG/ACT nasal spray Place 1 spray into both nostrils daily.   Yes [provider]  hydrochlorothiazide (HYDRODIURIL) 25 MG tablet Take 25 mg by mouth daily. 11/02/23  Yes [provider]  meloxicam (MOBIC) 15 MG tablet Take 15 mg by mouth daily.   Yes [provider]  pregabalin (LYRICA) 150 MG capsule Take 250 mg by mouth in the morning, at noon, and at bedtime.   Yes [provider]  sertraline (ZOLOFT) 50 MG tablet Take 50 mg by mouth daily.   Yes [provider]  traMADol (ULTRAM) 50 MG tablet Take 50 mg by mouth every 12 (twelve) hours as needed for moderate pain (pain score 4-6) or severe pain (pain score 7-10). 12/21/19  Yes [provider]  triamcinolone cream (KENALOG) 0.5 % Apply 1 application topically 3 (three) times daily.   Yes [provider]  ascorbic acid (VITAMIN C) 100 MG tablet Take 1 tablet by mouth daily. Patient not taking: Reported on 02/14/2024 12/03/19   [provider]  cyanocobalamin 1000 MCG tablet Take by mouth. Patient not taking: Reported on 02/14/2024 12/03/19   [provider]  ferrous gluconate (FERGON) 324 MG tablet Take by mouth. Patient not taking: Reported on  02/14/2024 12/03/19   [provider]  ibuprofen (ADVIL,MOTRIN) 200 MG tablet Take 200 mg by mouth every 6 (six) hours as needed. Patient not taking: Reported on 02/14/2024    [provider]  Liraglutide -Weight Management (SAXENDA ) Inject 30 mg into the skin daily. Patient not taking: Reported on 02/14/2024    [provider]  loratadine (CLARITIN) 10 MG tablet Take 10 mg by mouth daily. Patient not taking: Reported on 07/31/2020    [provider]  methocarbamol (ROBAXIN) 500 MG tablet Take by mouth. Patient not taking: Reported on 02/14/2024 11/15/19   [provider]     Critical care time: additional 32 minutes     CRITICAL CARE Performed by: Leita SAUNDERS Anjeanette Petzold   Total critical care time: 32 minutes  Critical care time was exclusive of separately billable procedures and treating other patients.  Critical care was necessary to treat or prevent imminent or life-threatening deterioration.  Critical care was time spent personally by me on the following activities: development of treatment plan with patient and/or surrogate as well as nursing, discussions with consultants, evaluation of patient's response to treatment, examination of patient, obtaining history from patient or surrogate, ordering and performing treatments and interventions, ordering and review of laboratory studies, ordering and review of radiographic studies, pulse oximetry and re-evaluation of patient's condition.   Leita SAUNDERS Belynda Pagaduan, PA-C [facility] See Amion for pager If no response to pager , please call [phone] until 7pm After 7:00 pm call Elink  [phone]

## 2024-02-16 NOTE — Progress Notes (Signed)
 OT Cancellation Note  Patient Details Name: Breanna Riley MRN: [id number] DOB: [birth year]   Cancelled Treatment:    Reason Eval/Treat Not Completed: Active bedrest order;Patient not medically ready (On anticoagulate < 24 hrs. Per protocal will follow up after 24 hrs and once bedrest lifted as appropriate.)  Wash Nienhaus,HILLARY 02/16/2024, 12:03 PM Kreg Sink, OT/L   Acute OT Clinical Specialist Acute Rehabilitation Services Pager [phone] Office [phone]

## 2024-02-16 NOTE — Progress Notes (Signed)
 VASCULAR LAB    Bilateral lower extremity venous duplex has been performed.  See CV proc for preliminary results.   Herron Fero, RVT 02/16/2024, 4:40 PM

## 2024-02-16 NOTE — Progress Notes (Addendum)
 Advanced Heart Failure Rounding Note  Cardiologist: None  Chief Complaint: Cor Pulmonale Subjective:    Co-ox 63%, CVP 15 BP remains soft on NE 10 4.6L UOP (only net -1.4) with Lasix 40 mg bid sCr 1.81 > 1.75 Low grade fever Tm 100F  Lying flat in bed. Breathing feels much better, although she doesn't remember feeling short of breath. No CP or groin pain.   Objective:    Weight Range: 127.7 kg Body mass index is 56.86 kg/m.   Vital Signs:   Temp:  [97.5 F (36.4 C)-100.2 F (37.9 C)] 99.5 F (37.5 C) (10/02 0700) Pulse Rate:  [73-96] 83 (10/02 0700) Resp:  [7-26] 15 (10/02 0700) BP: (80-140)/(55-96) 118/73 (10/01 1500) SpO2:  [80 %-100 %] 92 % (10/02 0700) Arterial Line BP: (73-149)/(6-72) 100/50 (10/02 0700) FiO2 (%):  [36 %] 36 % (10/01 2148) Last BM Date : 02/14/24  Weight change: Filed Weights   02/14/24 1201 02/14/24 1835  Weight: 123.8 kg 127.7 kg   Intake/Output:  Intake/Output Summary (Last 24 hours) at 02/16/2024 0818 Last data filed at 02/16/2024 0700 Gross per 24 hour  Intake 2754.13 ml  Output 4620 ml  Net -1865.87 ml    Physical Exam    General: Obese appearing. No distress on CPAP Cardiac: JVP difficult to assess. S1 and S2 present. No murmurs. Abdomen: Soft, non-tender, non-distended.  Extremities: Warm and dry.  Generalized edema.  Neuro: Alert and oriented x3. Affect pleasant.   Telemetry   SR 80-90s (personally reviewed)   Labs    CBC Recent Labs    02/14/24 1132 02/14/24 1147 02/15/24 0110 02/15/24 1600 02/16/24 0500  WBC 11.6*   < > 9.3 8.7 9.0  NEUTROABS 8.8*  --  7.7  --   --   HGB 11.0*   < > 10.2* 8.9* 8.5*  HCT 35.0*   < > 32.7* 28.0* 27.3*  MCV 89.3   < > 89.1 87.8 88.9  PLT 295   < > 288 237 265   < > = values in this interval not displayed.   Basic Metabolic Panel Recent Labs    90/69/74 1545 02/15/24 0108 02/15/24 0110 02/15/24 1600 02/16/24 0500  NA  --   --    < > 134* 132*  K  --   --    < > 3.8  3.7  CL  --   --    < > 104 99  CO2  --   --    < > 22 23  GLUCOSE  --   --    < > 108* 172*  BUN  --   --    < > 18 16  CREATININE 1.37*  --    < > 1.81* 1.75*  CALCIUM  --   --    < > 8.1* 8.2*  MG 1.3* 1.7  --  1.7 1.6*  PHOS 3.2 4.6  --   --   --    < > = values in this interval not displayed.   Liver Function Tests Recent Labs    02/14/24 1132 02/15/24 0110  AST 57* 73*  ALT 35 41  ALKPHOS 58 66  BILITOT 1.3* 1.1  PROT 6.6 5.9*  ALBUMIN 3.2* 2.8*   Recent Labs    02/14/24 1132  LIPASE 30   Thyroid  Function Tests Recent Labs    02/14/24 1545  TSH 1.320   Medications:    Scheduled Medications:  atorvastatin  20 mg Oral  QHS   Chlorhexidine Gluconate Cloth  6 each Topical Daily   cholecalciferol  1,000 Units Oral BID   ferrous gluconate  324 mg Oral Q breakfast   pantoprazole  40 mg Oral QAC breakfast   pregabalin  200 mg Oral TID   sertraline  50 mg Oral Daily   sodium chloride flush  10-40 mL Intracatheter Q12H    Infusions:  heparin 1,250 Units/hr (02/16/24 0700)   norepinephrine (LEVOPHED) Adult infusion 10 mcg/min (02/16/24 0700)    PRN Medications: acetaminophen **OR** acetaminophen, albuterol, bisacodyl, glucagon (human recombinant), hydrALAZINE, HYDROmorphone (DILAUDID) injection, hydrOXYzine, ondansetron  **OR** ondansetron  (ZOFRAN ) IV, oxyCODONE, senna-docusate, sodium chloride flush, sodium phosphate, traZODone  Patient Profile   63 y.o. female with history of obesity, OSA, HLD, osteoarthritis, and spinal stenosis s/p fusions.   Assessment/Plan   Bilateral Pulmonary Embolism with Cor Pulmonale - presented with syncope and lactic acidosis 4.9, now cleared - CTPE + for PE with significant RV strain s/p thrombectomy overnight - echo 10/1: EF 50-55% with RV strain and severely reduced RV, RVSP 42 - DVT studies pending - continue heparin gtt - CVP 15 - diuresing well, however large intake; add fluid restriction and discussed with  patient/son - give Lasix 40 mg bid today + metolazone 2.5 - GDMT limited by renal function   Hypotension - symptomatic - NE for BP support, wean as able   AKI - in the setting of hypotension, RV strain and now contrast - sCr improving with diuresis 2 > 1.8 > 1.7   Elevated Troponin - [phone] - suspect in the setting of acute b/l PE - ECG stable w no STE  - on heparin - would likely benefit from ischemic eval, if renal function allows   HLD - continue atorva 20 mg daily   Urinary Retention - place foley    OSA - using home CPAP   Obesity - Body mass index is 56.86 kg/m. - PCP has been working on GLP1 - [facility]  Groin Bleeding Anemia - 10/1 art bleeding from access site after sutures removed, required re-suturing and femstop - site ok today; remove sutures in a couple days - check iron studies  CRITICAL CARE Performed by: [place] Lee  Total critical care time: 13 minutes  -Critical care time was exclusive of separately billable procedures and treating other patients. -Critical care was necessary to treat or prevent imminent or life-threatening deterioration. -Critical care was time spent personally by me on the following activities: development of treatment plan with patient and/or surrogate as well as nursing, discussions with consultants, evaluation of patient's response to treatment, examination of patient, obtaining history from patient or surrogate, ordering and performing treatments and interventions, ordering and review of laboratory studies, ordering and review of radiographic studies, pulse oximetry and re-evaluation of patient's condition.  Length of Stay: 2  [place] Lee, NP  02/16/2024, 8:18 AM  Advanced Heart Failure Team Pager [phone] (M-F; 7a - 5p)  Please contact [facility] for night-coverage after hours (5p -7a ) and weekends on [email or web address]  Agree with above.   Denies CP or SOB. Now on NE 10 for BP support.  Co-ox 63% CVP 15   Groin site stable.   General: Obese woman lying in bed  No resp difficulty HEENT: normal Neck: supple. JVP hard to see . \ Cor: Regular rate & rhythm. No rubs, gallops or murmurs. Lungs: clear Abdomen: obese  soft, nontender, nondistended. No hepatosplenomegaly. No bruits or masses. Good bowel sounds. Extremities:  no cyanosis, clubbing, rash, tr edema  R groin site ok  Neuro: alert & orientedx3, cranial nerves grossly intact. moves all 4 extremities w/o difficulty. Affect pleasant  She is s/p thrombectomy for acute B massive PE with RV strain. Still requiring NE support. Diuresing well but need to be careful not to overdiurese.   Continue heparin. Check LE u/s. Wean NE> Careful with diuresis. Repeat limited echo in am.   CRITICAL CARE Performed by: Cherrie Sieving  Total critical care time: 38 minutes  Critical care time was exclusive of separately billable procedures and treating other patients.  Critical care was necessary to treat or prevent imminent or life-threatening deterioration.  Critical care was time spent personally by me (independent of midlevel providers or residents) on the following activities: development of treatment plan with patient and/or surrogate as well as nursing, discussions with consultants, evaluation of patient's response to treatment, examination of patient, obtaining history from patient or surrogate, ordering and performing treatments and interventions, ordering and review of laboratory studies, ordering and review of radiographic studies, pulse oximetry and re-evaluation of patient's condition.

## 2024-02-16 NOTE — Progress Notes (Signed)
 Pulmonary emboli with right heart strain s/p pulmonary artery thrombectomy 02/15/24 with Dr. Johann.  Originally, a purse-string suture was placed into the right common femoral vein post-thrombectomy with instructions to remove suture after 6 hours. IR PA removed suture yesterday with subsequent bleeding requiring re-suturing and use of FemoStop device.   Today, suture is in place. Site is tender, possibly from prolonged recent pressure application for hemostasis yesterday. Otherwise, no ecchymosis or palpable mass is noted on assessment. Patient states she is feeling better today, sitting up in bed eating fruit from her tray with family at bedside.   The timing of suture removal will be determined by Dr. Nelle team but IR will continue to follow.   Breanna Iverson NP 02/16/2024 2:38 PM

## 2024-02-16 NOTE — Progress Notes (Signed)
 PHARMACY - ANTICOAGULATION CONSULT NOTE  Pharmacy Consult for heparin Indication: pulmonary embolus  Allergies  Allergen Reactions   Naproxen Other (See Comments)    Nose bleeding    Patient Measurements: Height: 4' 11 (149.9 cm) Weight: 127.7 kg (281 lb 8.4 oz) IBW/kg (Calculated) : 43.2 HEPARIN DW (KG): 76.1  Vital Signs: Temp: 99.3 F (37.4 C) (10/02 1000) Temp Source: Bladder (10/02 0800) Pulse Rate: 84 (10/02 1000)  Labs: Recent Labs    02/14/24 1132 02/14/24 1147 02/14/24 1544 02/14/24 1545 02/15/24 0110 02/15/24 1600 02/16/24 0101 02/16/24 0500 02/16/24 1043  HGB 11.0*   < >  --    < > 10.2* 8.9*  --  8.5*  --   HCT 35.0*   < >  --    < > 32.7* 28.0*  --  27.3*  --   PLT 295  --   --    < > 288 237  --  265  --   LABPROT 13.8  --   --   --   --   --   --   --   --   INR 1.0  --   --   --   --   --   --   --   --   HEPARINUNFRC  --   --   --   --   --   --  >1.10*  --  0.88*  CREATININE 1.63*   < >  --    < > 2.01* 1.81*  --  1.75*  --   TROPONINIHS 52*  --  332*  --  665* 596*  --   --   --    < > = values in this interval not displayed.    Estimated Creatinine Clearance: 39 mL/min (A) (by C-G formula based on SCr of 1.75 mg/dL (H)).   Medical History: Past Medical History:  Diagnosis Date   Hyperlipidemia    Knee pain    Overweight    Reflux     Medications:  Medications Prior to Admission  Medication Sig Dispense Refill Last Dose/Taking   atorvastatin (LIPITOR) 20 MG tablet Take 20 mg by mouth daily.   02/13/2024   cholecalciferol (VITAMIN D) 1000 UNITS tablet Take 1,000 Units by mouth 2 (two) times daily.   02/13/2024   Cyclobenzaprine HCl (FLEXERIL PO) Take 10 mg by mouth daily as needed (muscle spasms).   Past Week   esomeprazole (NEXIUM) 40 MG capsule Take 40 mg by mouth daily at 12 noon.   02/13/2024   esomeprazole (NEXIUM) 40 MG capsule Take 40 mg by mouth daily.   Past Week   fluticasone (FLONASE) 50 MCG/ACT nasal spray Place 1 spray  into both nostrils daily.   Past Week   hydrochlorothiazide (HYDRODIURIL) 25 MG tablet Take 25 mg by mouth daily.   Past Week   meloxicam (MOBIC) 15 MG tablet Take 15 mg by mouth daily as needed for pain.   Past Month   polyethylene glycol (MIRALAX / GLYCOLAX) 17 g packet Take 17 g by mouth daily as needed for mild constipation.   Past Week   pregabalin (LYRICA) 150 MG capsule Take 200 mg by mouth in the morning, at noon, and at bedtime.   02/13/2024   Semaglutide (RYBELSUS) 3 MG TABS Take 3 mg by mouth daily.   02/13/2024   sertraline (ZOLOFT) 50 MG tablet Take 50 mg by mouth daily.   02/13/2024   traMADol (ULTRAM) 50 MG tablet  Take 50 mg by mouth every 12 (twelve) hours as needed for moderate pain (pain score 4-6) or severe pain (pain score 7-10).   Past Week   triamcinolone cream (KENALOG) 0.5 % Apply 1 application topically 3 (three) times daily.   Past Week   ascorbic acid (VITAMIN C) 100 MG tablet Take 1 tablet by mouth daily. (Patient not taking: Reported on 02/15/2024)   Not Taking   cyanocobalamin 1000 MCG tablet Take by mouth. (Patient not taking: Reported on 02/14/2024)   Not Taking   ferrous gluconate (FERGON) 324 MG tablet Take by mouth. (Patient not taking: Reported on 02/14/2024)   Not Taking   ibuprofen (ADVIL,MOTRIN) 200 MG tablet Take 200 mg by mouth every 6 (six) hours as needed. (Patient not taking: Reported on 02/14/2024)   Not Taking   Liraglutide -Weight Management (SAXENDA [place]) Inject 30 mg into the skin daily. (Patient not taking: Reported on 02/14/2024)   Not Taking   loratadine (CLARITIN) 10 MG tablet Take 10 mg by mouth daily. (Patient not taking: Reported on 07/31/2020)   Not Taking   methocarbamol (ROBAXIN) 500 MG tablet Take by mouth. (Patient not taking: Reported on 02/14/2024)   Not Taking   Scheduled:   atorvastatin  20 mg Oral QHS   Chlorhexidine Gluconate Cloth  6 each Topical Daily   cholecalciferol  1,000 Units Oral BID   cyclobenzaprine  10 mg Oral QHS   ferrous  gluconate  324 mg Oral Q breakfast   furosemide  40 mg Intravenous BID   metolazone  2.5 mg Oral Once   pantoprazole  40 mg Oral QAC breakfast   potassium chloride  30 mEq Oral Q4H   pregabalin  200 mg Oral TID   senna-docusate  2 tablet Oral QHS   sertraline  50 mg Oral Daily   sodium chloride flush  10-40 mL Intracatheter Q12H   Infusions:   heparin 1,250 Units/hr (02/16/24 1011)   magnesium sulfate bolus IVPB 4 g (02/16/24 1055)   norepinephrine (LEVOPHED) Adult infusion 9 mcg/min (02/16/24 1011)    Assessment: 63yo female presented to ED c/o syncopal episodes x3 at home then again w/ EMS, found to be hypotensive; in ED troponin was elevated and pt was started on heparin, which was subsequently d/c'd when cards determined the trop elevated was likely d/t injury 2/2 hypotension and shock; pt remained hypotensive despite aggressive fluid resuscitation; pt was moved to ICU for pressors, CT ordered which reveals extensive bilateral PE w/ significant RHS >> to resume heparin at VTE dosing.  02/16/24 1200: Heparin level 0.88, supra-therapeutic but closer to goal on heparin 1250 units/hr. No issues with infusion running or signs of bleeding per RN. CBC stable (Hgb 8.5, PLT 265). Noted patient had significant bleeding with right femoral suture placement yesterday. This has since resolved.   Goal of Therapy:  Heparin level 0.3-0.7 units/ml Monitor platelets by anticoagulation protocol: Yes   Plan:  Decrease heparin to 1050 units/hr Heparin level in 8 hours Monitor for bleeding F/u long term anticoagulation plan  Morna Breach, PharmD PGY2 Cardiology Pharmacy Resident 02/16/2024 11:22 AM

## 2024-02-16 NOTE — Progress Notes (Signed)
 PHARMACY - ANTICOAGULATION CONSULT NOTE  Pharmacy Consult for heparin Indication: pulmonary embolus  Allergies  Allergen Reactions   Naproxen Other (See Comments)    Nose bleeding    Patient Measurements: Height: 4' 11 (149.9 cm) Weight: 127.7 kg (281 lb 8.4 oz) IBW/kg (Calculated) : 43.2 HEPARIN DW (KG): 76.1  Vital Signs: Temp: 99.1 F (37.3 C) (10/02 1415) Temp Source: Bladder (10/02 0800) Pulse Rate: 82 (10/02 1745)  Labs: Recent Labs    02/14/24 1132 02/14/24 1147 02/14/24 1544 02/14/24 1545 02/15/24 0110 02/15/24 1600 02/16/24 0101 02/16/24 0500 02/16/24 1043 02/16/24 1801  HGB 11.0*   < >  --    < > 10.2* 8.9*  --  8.5*  --   --   HCT 35.0*   < >  --    < > 32.7* 28.0*  --  27.3*  --   --   PLT 295  --   --    < > 288 237  --  265  --   --   LABPROT 13.8  --   --   --   --   --   --   --   --   --   INR 1.0  --   --   --   --   --   --   --   --   --   HEPARINUNFRC  --   --   --   --   --   --  >1.10*  --  0.88* 0.52  CREATININE 1.63*   < >  --    < > 2.01* 1.81*  --  1.75*  --   --   TROPONINIHS 52*  --  332*  --  665* 596*  --   --   --   --    < > = values in this interval not displayed.    Estimated Creatinine Clearance: 39 mL/min (A) (by C-G formula based on SCr of 1.75 mg/dL (H)).   Medical History: Past Medical History:  Diagnosis Date   Hyperlipidemia    Knee pain    Overweight    Reflux     Medications:  Medications Prior to Admission  Medication Sig Dispense Refill Last Dose/Taking   atorvastatin (LIPITOR) 20 MG tablet Take 20 mg by mouth daily.   02/13/2024   cholecalciferol (VITAMIN D) 1000 UNITS tablet Take 1,000 Units by mouth 2 (two) times daily.   02/13/2024   Cyclobenzaprine HCl (FLEXERIL PO) Take 10 mg by mouth daily as needed (muscle spasms).   Past Week   esomeprazole (NEXIUM) 40 MG capsule Take 40 mg by mouth daily at 12 noon.   02/13/2024   esomeprazole (NEXIUM) 40 MG capsule Take 40 mg by mouth daily.   Past Week    fluticasone (FLONASE) 50 MCG/ACT nasal spray Place 1 spray into both nostrils daily.   Past Week   hydrochlorothiazide (HYDRODIURIL) 25 MG tablet Take 25 mg by mouth daily.   Past Week   meloxicam (MOBIC) 15 MG tablet Take 15 mg by mouth daily as needed for pain.   Past Month   polyethylene glycol (MIRALAX / GLYCOLAX) 17 g packet Take 17 g by mouth daily as needed for mild constipation.   Past Week   pregabalin (LYRICA) 150 MG capsule Take 200 mg by mouth in the morning, at noon, and at bedtime.   02/13/2024   Semaglutide (RYBELSUS) 3 MG TABS Take 3 mg by mouth daily.  02/13/2024   sertraline (ZOLOFT) 50 MG tablet Take 50 mg by mouth daily.   02/13/2024   traMADol (ULTRAM) 50 MG tablet Take 50 mg by mouth every 12 (twelve) hours as needed for moderate pain (pain score 4-6) or severe pain (pain score 7-10).   Past Week   triamcinolone cream (KENALOG) 0.5 % Apply 1 application topically 3 (three) times daily.   Past Week   ascorbic acid (VITAMIN C) 100 MG tablet Take 1 tablet by mouth daily. (Patient not taking: Reported on 02/15/2024)   Not Taking   cyanocobalamin 1000 MCG tablet Take by mouth. (Patient not taking: Reported on 02/14/2024)   Not Taking   ferrous gluconate (FERGON) 324 MG tablet Take by mouth. (Patient not taking: Reported on 02/14/2024)   Not Taking   ibuprofen (ADVIL,MOTRIN) 200 MG tablet Take 200 mg by mouth every 6 (six) hours as needed. (Patient not taking: Reported on 02/14/2024)   Not Taking   Liraglutide -Weight Management (SAXENDA [place]) Inject 30 mg into the skin daily. (Patient not taking: Reported on 02/14/2024)   Not Taking   loratadine (CLARITIN) 10 MG tablet Take 10 mg by mouth daily. (Patient not taking: Reported on 07/31/2020)   Not Taking   methocarbamol (ROBAXIN) 500 MG tablet Take by mouth. (Patient not taking: Reported on 02/14/2024)   Not Taking   Scheduled:   atorvastatin  20 mg Oral QHS   Chlorhexidine Gluconate Cloth  6 each Topical Daily   cholecalciferol  1,000 Units  Oral BID   cyclobenzaprine  10 mg Oral QHS   ferrous gluconate  324 mg Oral Q breakfast   furosemide  40 mg Intravenous BID   pantoprazole  40 mg Oral QAC breakfast   pregabalin  200 mg Oral TID   senna-docusate  2 tablet Oral QHS   sertraline  50 mg Oral Daily   sodium chloride flush  10-40 mL Intracatheter Q12H   Infusions:   heparin 1,050 Units/hr (02/16/24 1815)   norepinephrine (LEVOPHED) Adult infusion 2 mcg/min (02/16/24 1800)    Assessment: 63yo female presented to ED c/o syncopal episodes x3 at home then again w/ EMS, found to be hypotensive; in ED troponin was elevated and pt was started on heparin, which was subsequently d/c'd when cards determined the trop elevated was likely d/t injury 2/2 hypotension and shock; pt remained hypotensive despite aggressive fluid resuscitation; pt was moved to ICU for pressors, CT ordered which reveals extensive bilateral PE w/ significant RHS >> to resume heparin at VTE dosing.  -heparin level= 0.52 on 1050 units/hr  Goal of Therapy:  Heparin level 0.3-0.7 units/ml Monitor platelets by anticoagulation protocol: Yes   Plan:  -Continue heparin 1050 units/hr -Heparin level and CBC daily  Prentice Poisson, PharmD Clinical Pharmacist **Pharmacist phone directory can now be found on [email or web address] ([email or web address]).  Listed under [facility].

## 2024-02-16 NOTE — Progress Notes (Signed)
 [facility] ADULT ICU REPLACEMENT PROTOCOL   The patient does apply for the [facility] Adult ICU Electrolyte Replacment Protocol based on the criteria listed below:   1.Exclusion criteria: TCTS, ECMO, Dialysis, and Myasthenia Gravis patients 2. Is GFR >/= 30 ml/min? Yes.    Patient's GFR today is 32 3. Is SCr </= 2? Yes.   Patient's SCr is 1.75 mg/dL 4. Did SCr increase >/= 0.5 in 24 hours? No. 5.Pt's weight >40kg  Yes.   6. Abnormal electrolyte(s): Mag 1.6  7. Electrolytes replaced per protocol 8.  Call MD STAT for K+ </= 2.5, Phos </= 1, or Mag </= 1 Physician:  DOROTHA Claudene Smalls D Ouedraogo 02/16/2024 5:44 AM

## 2024-02-16 NOTE — Progress Notes (Signed)
 PT Cancellation Note  Patient Details Name: Breanna Riley MRN: [id number] DOB: [birth year]   Cancelled Treatment:    Reason Eval/Treat Not Completed: Active bedrest order (Await bedrest removal and pt not anticoagulated. Will check back tomorrow.)   Breanna Riley 02/16/2024, 12:08 PM Kent Riendeau M,PT Acute Rehab Services [phone]

## 2024-02-16 NOTE — Progress Notes (Addendum)
 PHARMACY - ANTICOAGULATION CONSULT NOTE  Pharmacy Consult for heparin Indication: pulmonary embolus  Allergies  Allergen Reactions   Naproxen Other (See Comments)    Nose bleeding    Patient Measurements: Height: 4' 11 (149.9 cm) Weight: 127.7 kg (281 lb 8.4 oz) IBW/kg (Calculated) : 43.2 HEPARIN DW (KG): 76.1  Vital Signs: Temp: 99.7 F (37.6 C) (10/02 0145) BP: 118/73 (10/01 1500) Pulse Rate: 85 (10/02 0130)  Labs: Recent Labs    02/14/24 1132 02/14/24 1147 02/14/24 1544 02/14/24 1545 02/15/24 0110 02/15/24 1600 02/16/24 0101  HGB 11.0*   < >  --  10.6* 10.2* 8.9*  --   HCT 35.0*   < >  --  33.2* 32.7* 28.0*  --   PLT 295  --   --  241 288 237  --   LABPROT 13.8  --   --   --   --   --   --   INR 1.0  --   --   --   --   --   --   HEPARINUNFRC  --   --   --   --   --   --  >1.10*  CREATININE 1.63*   < >  --  1.37* 2.01* 1.81*  --   TROPONINIHS 52*  --  332*  --  665* 596*  --    < > = values in this interval not displayed.    Estimated Creatinine Clearance: 37.7 mL/min (A) (by C-G formula based on SCr of 1.81 mg/dL (H)).   Medical History: Past Medical History:  Diagnosis Date   Hyperlipidemia    Knee pain    Overweight    Reflux     Medications:  Medications Prior to Admission  Medication Sig Dispense Refill Last Dose/Taking   atorvastatin (LIPITOR) 20 MG tablet Take 20 mg by mouth daily.   02/13/2024   cholecalciferol (VITAMIN D) 1000 UNITS tablet Take 1,000 Units by mouth 2 (two) times daily.   02/13/2024   Cyclobenzaprine HCl (FLEXERIL PO) Take 10 mg by mouth daily as needed (muscle spasms).   Past Week   esomeprazole (NEXIUM) 40 MG capsule Take 40 mg by mouth daily at 12 noon.   02/13/2024   esomeprazole (NEXIUM) 40 MG capsule Take 40 mg by mouth daily.   Past Week   fluticasone (FLONASE) 50 MCG/ACT nasal spray Place 1 spray into both nostrils daily.   Past Week   hydrochlorothiazide (HYDRODIURIL) 25 MG tablet Take 25 mg by mouth daily.   Past  Week   meloxicam (MOBIC) 15 MG tablet Take 15 mg by mouth daily as needed for pain.   Past Month   polyethylene glycol (MIRALAX / GLYCOLAX) 17 g packet Take 17 g by mouth daily as needed for mild constipation.   Past Week   pregabalin (LYRICA) 150 MG capsule Take 200 mg by mouth in the morning, at noon, and at bedtime.   02/13/2024   Semaglutide (RYBELSUS) 3 MG TABS Take 3 mg by mouth daily.   02/13/2024   sertraline (ZOLOFT) 50 MG tablet Take 50 mg by mouth daily.   02/13/2024   traMADol (ULTRAM) 50 MG tablet Take 50 mg by mouth every 12 (twelve) hours as needed for moderate pain (pain score 4-6) or severe pain (pain score 7-10).   Past Week   triamcinolone cream (KENALOG) 0.5 % Apply 1 application topically 3 (three) times daily.   Past Week   ascorbic acid (VITAMIN C) 100 MG tablet  Take 1 tablet by mouth daily. (Patient not taking: Reported on 02/15/2024)   Not Taking   cyanocobalamin 1000 MCG tablet Take by mouth. (Patient not taking: Reported on 02/14/2024)   Not Taking   ferrous gluconate (FERGON) 324 MG tablet Take by mouth. (Patient not taking: Reported on 02/14/2024)   Not Taking   ibuprofen (ADVIL,MOTRIN) 200 MG tablet Take 200 mg by mouth every 6 (six) hours as needed. (Patient not taking: Reported on 02/14/2024)   Not Taking   Liraglutide -Weight Management (SAXENDA [place]) Inject 30 mg into the skin daily. (Patient not taking: Reported on 02/14/2024)   Not Taking   loratadine (CLARITIN) 10 MG tablet Take 10 mg by mouth daily. (Patient not taking: Reported on 07/31/2020)   Not Taking   methocarbamol (ROBAXIN) 500 MG tablet Take by mouth. (Patient not taking: Reported on 02/14/2024)   Not Taking   Scheduled:   atorvastatin  20 mg Oral QHS   Chlorhexidine Gluconate Cloth  6 each Topical Daily   cholecalciferol  1,000 Units Oral BID   ferrous gluconate  324 mg Oral Q breakfast   pantoprazole  40 mg Oral QAC breakfast   pregabalin  200 mg Oral TID   sertraline  50 mg Oral Daily   sodium chloride  flush  10-40 mL Intracatheter Q12H   Infusions:   heparin     norepinephrine (LEVOPHED) Adult infusion 4 mcg/min (02/16/24 0100)    Assessment: 63yo female presented to ED c/o syncopal episodes x3 at home then again w/ EMS, found to be hypotensive; in ED troponin was elevated and pt was started on heparin, which was subsequently d/c'd when cards determined the trop elevated was likely d/t injury 2/2 hypotension and shock; pt remained hypotensive despite aggressive fluid resuscitation; pt was moved to ICU for pressors, CT ordered which reveals extensive bilateral PE w/ significant RHS >> to resume heparin at VTE dosing.  02/15/24 PM: Patient with significant bleeding with right femoral suture placement. Heparin paused at 14:13. Patient since stabilized. Discussed with CCM, will now resume heparin at prior rate. Noted patient was briefly started on heparin for ACS yesterday at 1800, later stopped at 1900. Patient was rebolused with 4000 units heparin at 0400, then started on heparin 1400 units/hr.   10/2 AM update:  Heparin level supra-therapeutic, drawn appropriately per RN, no further significant bleeding   Goal of Therapy:  Heparin level 0.3-0.7 units/ml Monitor platelets by anticoagulation protocol: Yes   Plan:  Hold heparin x 1 hr Re-start heparin at 1250 units/hr Heparin level in 8 hours Monitor for bleeding F/u long term anticoagulation plan  Lynwood Mckusick, PharmD, BCPS Clinical Pharmacist Phone: [phone]

## 2024-02-16 NOTE — Progress Notes (Signed)
 VASCULAR LAB    Carotid duplex has been performed.  See CV proc for preliminary results.   Jaslyn Bansal, RVT 02/16/2024, 4:39 PM

## 2024-02-17 ENCOUNTER — Inpatient Hospital Stay (HOSPITAL_COMMUNITY)

## 2024-02-17 DIAGNOSIS — I2602 Saddle embolus of pulmonary artery with acute cor pulmonale: Secondary | ICD-10-CM

## 2024-02-17 DIAGNOSIS — I509 Heart failure, unspecified: Secondary | ICD-10-CM | POA: Diagnosis not present

## 2024-02-17 DIAGNOSIS — J9601 Acute respiratory failure with hypoxia: Secondary | ICD-10-CM

## 2024-02-17 DIAGNOSIS — I959 Hypotension, unspecified: Secondary | ICD-10-CM

## 2024-02-17 LAB — CBC
HCT: 24.5 % — ABNORMAL LOW (ref 36.0–46.0)
Hemoglobin: 7.9 g/dL — ABNORMAL LOW (ref 12.0–15.0)
MCH: 28.3 pg (ref 26.0–34.0)
MCHC: 32.2 g/dL (ref 30.0–36.0)
MCV: 87.8 fL (ref 80.0–100.0)
Platelets: 233 K/uL (ref 150–400)
RBC: 2.79 MIL/uL — ABNORMAL LOW (ref 3.87–5.11)
RDW: 13.7 % (ref 11.5–15.5)
WBC: 6.8 K/uL (ref 4.0–10.5)
nRBC: 0 % (ref 0.0–0.2)

## 2024-02-17 LAB — HEPARIN LEVEL (UNFRACTIONATED)
Heparin Unfractionated: 0.4 [IU]/mL (ref 0.30–0.70)
Heparin Unfractionated: 0.32 [IU]/mL (ref 0.30–0.70)

## 2024-02-17 LAB — BASIC METABOLIC PANEL WITH GFR
Anion gap: 6 (ref 5–15)
BUN: 15 mg/dL (ref 8–23)
CO2: 27 mmol/L (ref 22–32)
Calcium: 8.3 mg/dL — ABNORMAL LOW (ref 8.9–10.3)
Chloride: 101 mmol/L (ref 98–111)
Creatinine, Ser: 1.36 mg/dL — ABNORMAL HIGH (ref 0.44–1.00)
GFR, Estimated: 43 mL/min — ABNORMAL LOW (ref >60)
Glucose, Bld: 100 mg/dL — ABNORMAL HIGH (ref 70–99)
Potassium: 3.6 mmol/L (ref 3.5–5.1)
Sodium: 134 mmol/L — ABNORMAL LOW (ref 135–145)

## 2024-02-17 LAB — MAGNESIUM: Magnesium: 2.5 mg/dL — ABNORMAL HIGH (ref 1.7–2.4)

## 2024-02-17 LAB — COOXEMETRY PANEL
Carboxyhemoglobin: 2.3 % — ABNORMAL HIGH (ref 0.5–1.5)
Methemoglobin: <0.7 % (ref 0.0–1.5)
O2 Saturation: 65.2 %
Total hemoglobin: 8 g/dL — ABNORMAL LOW (ref 12.0–16.0)

## 2024-02-17 LAB — ECHOCARDIOGRAM LIMITED
Calc EF: 51.4 %
Height: 59 in
S' Lateral: 2.1 cm
Single Plane A2C EF: 48.2 %
Single Plane A4C EF: 54.5 %
Weight: 4504.44 [oz_av]

## 2024-02-17 MED ORDER — APIXABAN 5 MG PO TABS
5.0000 mg | ORAL_TABLET | Freq: Two times a day (BID) | ORAL | Status: DC
  Administered 2024-02-24 – 2024-02-28 (×8): 5 mg via ORAL
  Filled 2024-02-17 (×8): qty 1

## 2024-02-17 MED ORDER — MAGNESIUM SULFATE 2 GM/50ML IV SOLN
2.0000 g | Freq: Once | INTRAVENOUS | Status: AC
  Administered 2024-02-17: 2 g via INTRAVENOUS
  Filled 2024-02-17: qty 50

## 2024-02-17 MED ORDER — APIXABAN 5 MG PO TABS
10.0000 mg | ORAL_TABLET | Freq: Two times a day (BID) | ORAL | Status: AC
  Administered 2024-02-17 – 2024-02-24 (×14): 10 mg via ORAL
  Filled 2024-02-17 (×14): qty 2

## 2024-02-17 MED ORDER — PERFLUTREN LIPID MICROSPHERE
1.0000 mL | INTRAVENOUS | Status: AC | PRN
  Administered 2024-02-17: 2 mL via INTRAVENOUS

## 2024-02-17 MED ORDER — ORAL CARE MOUTH RINSE: 15.0000 mL | OROMUCOSAL | Status: DC | PRN

## 2024-02-17 MED ORDER — POTASSIUM CHLORIDE CRYS ER 20 MEQ PO TBCR
40.0000 meq | EXTENDED_RELEASE_TABLET | Freq: Once | ORAL | Status: AC
  Administered 2024-02-17: 40 meq via ORAL
  Filled 2024-02-17: qty 2

## 2024-02-17 NOTE — Plan of Care (Signed)
 Progressing Add All Education: Knowledge of General Education information will improve Add Today at 2323 - Progressing by Rolfe Corean HERO, RN Add Health Behavior/Discharge Planning: Ability to manage health-related needs will improve Add Today at 2323 - Progressing by Rolfe Corean HERO, RN Add Clinical Measurements: Ability to maintain clinical measurements within normal limits will improve Add Today at 2323 - Progressing by Rolfe Corean HERO, RN Add Will remain free from infection Add Today at 2323 - Progressing by Rolfe Corean HERO, RN Add Diagnostic test results will improve Add Today at 2323 - Progressing by Rolfe Corean HERO, RN Add Respiratory complications will improve Add Today at 2323 - Progressing by Rolfe Corean HERO, RN Add Cardiovascular complication will be avoided Add Today at 2323 - Progressing by Rolfe Corean HERO, RN Add Activity: Risk for activity intolerance will decrease Add Today at 2323 - Progressing by Rolfe Corean HERO, RN Add Nutrition: Adequate nutrition will be maintained Add Today at 2323 - Progressing by Rolfe Corean HERO, RN Add Coping: Level of anxiety will decrease Add Today at 2323 - Progressing by Rolfe Corean HERO, RN Add Elimination: Will not experience complications related to bowel motility Add Today at 2323 - Progressing by Rolfe Corean HERO, RN Add Will not experience complications related to urinary retention Add Today at 2323 - Progressing by Rolfe Corean HERO, RN Add Pain Managment: General experience of comfort will improve and/or be controlled Add Today at 2323 - Progressing by Rolfe Corean HERO, RN Add Safety: Ability to remain free from injury will improve Add Today at 2323 - Progressing by Rolfe Corean HERO, RN Add Skin Integrity: Risk for impaired skin integrity will decrease Add Today at 2323 - Progressing by Rolfe Corean HERO, RN Add Education: Understanding of CV disease, CV risk reduction, and recovery process will improve Add Today at 2323 - Progressing by Rolfe Corean HERO, RN Add Individualized Educational Video(s) Add Today at 2323 - Progressing by Rolfe Corean HERO, RN Add Activity: Ability to return to baseline activity level will improve Add Today at 2323 - Progressing by Rolfe Corean HERO, RN Add Cardiovascular: Ability to achieve and maintain adequate cardiovascular perfusion will improve Add Today at 2323 - Progressing by Rolfe Corean HERO, RN Add Vascular access site(s) Level 0-1 will be maintained Add Today at 2323 - Progressing by Rolfe Corean HERO, RN Add Health Behavior/Discharge Planning: Ability to safely manage health-related needs after discharge will improve Add Today at 2323 - Progressing by Rolfe Corean HERO, RN

## 2024-02-17 NOTE — Progress Notes (Signed)
 OT Cancellation Note  Patient Details Name: Breanna Riley MRN: [id number] DOB: August 22, 1960   Cancelled Treatment:    Reason Eval/Treat Not Completed: Active bedrest order (pt not therapeutic range for Anticoagulate  RN requesting to hold until MD rounds for further readiness for therapy)  Ely Molt 02/17/2024, 9:36 AM

## 2024-02-17 NOTE — Progress Notes (Signed)
 NAME:  Breanna Riley, MRN:  [id number], DOB:  [birth year], LOS: 3 ADMISSION DATE:  02/14/2024, CONSULTATION DATE:  02/15/2024 REFERRING MD:  Redia Cleaver, MD, CHIEF COMPLAINT:  Hypotension  History of Present Illness:  63 y/o female with PMH for OSA, Obseity, HLD  who presented with syncope and was admitted to Hospitalist service.  She has [facility] 30 lbs [facility] and was started on hydrochlorothiazide 25 mg a few days ago.  She started with Lightheadedness 2 days ago and then had 2 episodes of syncope last about 1 min.  Lab work on admission showing AKI and increased LA.  She was started on IV fluids and she has been given 5 liters and still remains hypotensive.  She was started on Levophed.  Pertinent  Medical History  OSA, Obseity, HLD    Significant Hospital Events: Including procedures, antibiotic start and stop dates in addition to other pertinent events   10/1: transfer to ICU, s/p thrombectomy, requiring norepi 10/2 stable but still on levo 10mcg 10/3 weaning levophed as tolerated, + right/left DVT Lower extremities   Interim History / Subjective:  No acute events overnight Still having intermittent right foot pain from fall   Oriented x4   Levophed gtt at 3mcg  On CPAP   Objective    Blood pressure 118/73, pulse 72, temperature 98.6 F (37 C), resp. rate 11, height 4' 11 (1.499 m), weight 127.7 kg, SpO2 97%. CVP:  [8 mmHg-69 mmHg] 14 mmHg  FiO2 (%):  [36 %] 36 %   Intake/Output Summary (Last 24 hours) at 02/17/2024 0717 Last data filed at 02/17/2024 0600 Gross per 24 hour  Intake 809.93 ml  Output 6200 ml  Net -5390.07 ml   Filed Weights   02/14/24 1201 02/14/24 1835  Weight: 123.8 kg 127.7 kg   General: acute on chronic adult female, NAD HEENT: Normocephalic, PERRLA intact, Pink MM CV: s1,s2, RRR, no MRG, No JVD  pulm: clear, diminished, on CPAP- no distress  Abs: bs active, soft  Extremities: generalized edema- 1 +, no deformity, moves  all extremities on command, right foot slightly more swollen than left  Skin: no rash  Neuro: Rass 0, follows commands  GU: foley intact   Resolved problem list   Assessment and Plan   Syncope and obstructive shock secondary to extensive bilateral Pulmonary Emboli with cor pulmonale and acute hypoxic respiratory failure s/p Thrombectomy 10/1   -no clear inciting factor, has been taking Rybelsus for approximately three months, discussed with pharmacy and there have been some case reports of DVT/PE. However, patient has been immobile since right foot injury and not being able to exercise.  10/1 Echo- left ventricle has low normal function. The left ventricle demonstrates regional wall motion abnormalities. Grade I diastolic dysfunction .There is the interventricular septum is flattened in diastole  consistent with right ventricular volume overload and the interventricular septum is flattened in systole and diastole, consistent with right ventricular pressure and volume overload. McConnell's sign is present, consistent with acute (or acute on  chronic) cor pulmonale. Right ventricular systolic function is severely  reduced. The right ventricular size is severely enlarged. There is mildly  elevated pulmonary artery systolic pressure.  Vas US  LE- acute deep vein thrombosis involving the right common femoral vein in this limited exam.acute deep vein thrombosis involving the left  posterior tibial veins.  P: Levophed at 3 mcg, continue to wean to maintain MAP > 65 Continue diuresis as tolerated Continue heparin gtt, hope to  transition to DOAC  Hold off SCDs due to findings in LE- + DVTs  AKI-improving  Baseline <1.0 2.01>1.8>1.75>1.36  Urine Output decent with lasix- 6.2 L output/last 24hrs, net negative -5.39 Liters  P: Continue to trend renal function daily  Continue to monitor and optimize electrolytes daily Continue to monitor urine output Continue strict I/Os Continue Adequate renal  perfusion  Avoid nephrotoxic agents Keep foley in one more day, remove tomorrow on 10/4   GERD P: Continue PPI   OSA P: Continue CPAP at night   Depression/Anxiety P: Continue zoloft, lyrica PRN hydroxyzine, trazodone at bedtime   Anemia  Hgb on 10/1 10.2>8.9>8.5>7.9 On heparin gtt  No signs of bleeding P: Patient will transition to DOAC today or tomorrow  Continue to monitor for signs of bleeding Transfuse with hgb < 7   R foot pain DG Foot 2 View X-ray R-No acute osseous abnormality. Mild diffuse soft tissue edema. No acute fracture  P: Rest, elevate, ice    Labs   CBC: Recent Labs  Lab 02/14/24 1132 02/14/24 1147 02/14/24 1545 02/15/24 0110 02/15/24 1600 02/16/24 0500 02/17/24 0325  WBC 11.6*  --  8.0 9.3 8.7 9.0 6.8  NEUTROABS 8.8*  --   --  7.7  --   --   --   HGB 11.0*   < > 10.6* 10.2* 8.9* 8.5* 7.9*  HCT 35.0*   < > 33.2* 32.7* 28.0* 27.3* 24.5*  MCV 89.3  --  88.1 89.1 87.8 88.9 87.8  PLT 295  --  241 288 237 265 233   < > = values in this interval not displayed.    Basic Metabolic Panel: Recent Labs  Lab 02/14/24 1132 02/14/24 1147 02/14/24 1545 02/15/24 0108 02/15/24 0110 02/15/24 1600 02/16/24 0500 02/17/24 0325  NA 140 141  --   --  136 134* 132* 134*  K 2.9* 2.8*  --   --  4.3 3.8 3.7 3.6  CL 104 104  --   --  104 104 99 101  CO2 20*  --   --   --  21* 22 23 27   GLUCOSE 248* 247*  --   --  150* 108* 172* 100*  BUN 15 17  --   --  17 18 16 15   CREATININE 1.63* 1.60* 1.37*  --  2.01* 1.81* 1.75* 1.36*  CALCIUM 9.5  --   --   --  8.6* 8.1* 8.2* 8.3*  MG  --   --  1.3* 1.7  --  1.7 1.6* 2.5*  PHOS  --   --  3.2 4.6  --   --   --   --    GFR: Estimated Creatinine Clearance: 50.1 mL/min (A) (by C-G formula based on SCr of 1.36 mg/dL (H)). Recent Labs  Lab 02/14/24 1147 02/14/24 1537 02/14/24 1544 02/14/24 1545 02/15/24 0110 02/15/24 1600 02/16/24 0500 02/17/24 0325  PROCALCITON  --   --  0.38  --   --   --   --   --   WBC   --   --   --    < > 9.3 8.7 9.0 6.8  LATICACIDVEN 4.9* 2.1*  --   --  1.8 1.0  --   --    < > = values in this interval not displayed.    Liver Function Tests: Recent Labs  Lab 02/14/24 1132 02/15/24 0110  AST 57* 73*  ALT 35 41  ALKPHOS 58 66  BILITOT 1.3* 1.1  PROT 6.6 5.9*  ALBUMIN 3.2* 2.8*   Recent Labs  Lab 02/14/24 1132  LIPASE 30   No results for input(s): AMMONIA in the last 168 hours.  ABG    Component Value Date/Time   TCO2 22 02/14/2024 1147   O2SAT 65.2 02/17/2024 0325     Coagulation Profile: Recent Labs  Lab 02/14/24 1132  INR 1.0    Cardiac Enzymes: No results for input(s): CKTOTAL, CKMB, CKMBINDEX, TROPONINI in the last 168 hours.  HbA1C: No results found for: HGBA1C  CBG: Recent Labs  Lab 02/14/24 1115 02/14/24 2254 02/15/24 0041 02/15/24 1149  GLUCAP 254* 127* 124* 99    Review of Systems:   No chest pain, no n/v/d, no abd pain  Past Medical History:  She,  has a past medical history of Hyperlipidemia, Knee pain, Overweight, and Reflux.   Surgical History:   Past Surgical History:  Procedure Laterality Date   IR ANGIOGRAM PULMONARY BILATERAL SELECTIVE  02/15/2024   IR ANGIOGRAM SELECTIVE EACH ADDITIONAL VESSEL  02/15/2024   IR ANGIOGRAM SELECTIVE EACH ADDITIONAL VESSEL  02/15/2024   IR THROMBECT PRIM MECH ADD (INCLU) MOD SED  02/15/2024   IR THROMBECT PRIM MECH ADD (INCLU) MOD SED  02/15/2024   IR THROMBECT PRIM MECH ADD (INCLU) MOD SED  02/15/2024   IR THROMBECT PRIM MECH INIT (INCLU) MOD SED  02/15/2024   IR US  GUIDE VASC ACCESS RIGHT  02/15/2024     Social History:   reports that she has never smoked. She does not have any smokeless tobacco history on file.   Family History:  Her family history is not on file.   Allergies Allergies  Allergen Reactions   Naproxen Other (See Comments)    Nose bleeding     Home Medications  Prior to Admission medications   Medication Sig Start Date End Date Taking?  Authorizing Provider  atorvastatin (LIPITOR) 20 MG tablet Take 20 mg by mouth daily.   Yes [provider]  cholecalciferol (VITAMIN D) 1000 UNITS tablet Take 1,000 Units by mouth 2 (two) times daily.   Yes [provider]  Cyclobenzaprine HCl (FLEXERIL PO) Take 20 mg by mouth daily as needed (muscle spasms).   Yes [provider]  esomeprazole (NEXIUM) 40 MG capsule Take 40 mg by mouth daily at 12 noon.   Yes [provider]  esomeprazole (NEXIUM) 40 MG capsule Take 40 mg by mouth daily.   Yes [provider]  fluticasone (FLONASE) 50 MCG/ACT nasal spray Place 1 spray into both nostrils daily.   Yes [provider]  hydrochlorothiazide (HYDRODIURIL) 25 MG tablet Take 25 mg by mouth daily. 11/02/23  Yes [provider]  meloxicam (MOBIC) 15 MG tablet Take 15 mg by mouth daily.   Yes [provider]  pregabalin (LYRICA) 150 MG capsule Take 250 mg by mouth in the morning, at noon, and at bedtime.   Yes [provider]  sertraline (ZOLOFT) 50 MG tablet Take 50 mg by mouth daily.   Yes [provider]  traMADol (ULTRAM) 50 MG tablet Take 50 mg by mouth every 12 (twelve) hours as needed for moderate pain (pain score 4-6) or severe pain (pain score 7-10). 12/21/19  Yes [provider]  triamcinolone cream (KENALOG) 0.5 % Apply 1 application topically 3 (three) times daily.   Yes [provider]  ascorbic acid (VITAMIN C) 100 MG tablet Take 1 tablet by mouth daily. Patient not taking: Reported on 02/14/2024 12/03/19   [provider]  cyanocobalamin 1000 MCG tablet Take by mouth. Patient not taking: Reported on 02/14/2024 12/03/19   [provider]  ferrous gluconate (FERGON) 324 MG tablet Take by mouth. Patient not taking: Reported on 02/14/2024 12/03/19   [provider]  ibuprofen (ADVIL,MOTRIN) 200 MG tablet Take 200 mg by mouth every 6 (six) hours as needed. Patient not  taking: Reported on 02/14/2024    [provider]  Liraglutide -Weight Management (SAXENDA [place]) Inject 30 mg into the skin daily. Patient not taking: Reported on 02/14/2024    [provider]  loratadine (CLARITIN) 10 MG tablet Take 10 mg by mouth daily. Patient not taking: Reported on 07/31/2020    [provider]  methocarbamol (ROBAXIN) 500 MG tablet Take by mouth. Patient not taking: Reported on 02/14/2024 11/15/19   [provider]     Critical care time: 45 mins     [facility]   [facility] 02/17/2024, 8:05 AM  Please see [email or web address] for pager details.  From 7A-7P if no response, please call [phone]. After hours, please call ELink [phone].

## 2024-02-17 NOTE — Progress Notes (Addendum)
 Advanced Heart Failure Rounding Note  HF Cardiologist: Dr. Cherrie  Chief Complaint: Cor Pulmonale   Subjective:    CO-OX 65% this am.  NE weaned off around 9 am.   CVP 7. Received 40 mg lasix IV BID + 2.5 mg metolazone yesterday w/ excellent diuresis. Scr further improved to 1.36.  Sitting up in chair. Intermittently lightheaded. BP soft.  Objective:    Weight Range: 127.7 kg Body mass index is 56.86 kg/m.   Vital Signs:   Temp:  [97.9 F (36.6 C)-100 F (37.8 C)] 98.2 F (36.8 C) (10/03 1030) Pulse Rate:  [72-98] 80 (10/03 1100) Resp:  [8-30] 15 (10/03 1100) BP: (98)/(58-60) 98/60 (10/03 1100) SpO2:  [87 %-100 %] 100 % (10/03 1100) Arterial Line BP: (66-158)/(51-144) 105/53 (10/03 1000) FiO2 (%):  [36 %] 36 % (10/03 0400) Last BM Date : 02/14/24  Weight change: Filed Weights   02/14/24 1201 02/14/24 1835  Weight: 123.8 kg 127.7 kg   Intake/Output:  Intake/Output Summary (Last 24 hours) at 02/17/2024 1128 Last data filed at 02/17/2024 1000 Gross per 24 hour  Intake 1155.04 ml  Output 6150 ml  Net -4994.96 ml    Physical Exam    General:  Sitting up in chair. Cor: No JVD. Regular rate & rhythm. No murmurs. Lungs: breathing nonlabored Abdomen: obese, soft, nontender, nondistended.  Extremities: no edema Neuro: alert & orientedx3. Affect pleasant   Telemetry   SR 80s  Labs    CBC Recent Labs    02/14/24 1132 02/14/24 1147 02/15/24 0110 02/15/24 1600 02/16/24 0500 02/17/24 0325  WBC 11.6*   < > 9.3   < > 9.0 6.8  NEUTROABS 8.8*  --  7.7  --   --   --   HGB 11.0*   < > 10.2*   < > 8.5* 7.9*  HCT 35.0*   < > 32.7*   < > 27.3* 24.5*  MCV 89.3   < > 89.1   < > 88.9 87.8  PLT 295   < > 288   < > 265 233   < > = values in this interval not displayed.   Basic Metabolic Panel Recent Labs    90/69/74 1545 02/15/24 0108 02/15/24 0110 02/16/24 0500 02/17/24 0325  NA  --   --    < > 132* 134*  K  --   --    < > 3.7 3.6  CL  --   --     < > 99 101  CO2  --   --    < > 23 27  GLUCOSE  --   --    < > 172* 100*  BUN  --   --    < > 16 15  CREATININE 1.37*  --    < > 1.75* 1.36*  CALCIUM  --   --    < > 8.2* 8.3*  MG 1.3* 1.7   < > 1.6* 2.5*  PHOS 3.2 4.6  --   --   --    < > = values in this interval not displayed.   Liver Function Tests Recent Labs    02/14/24 1132 02/15/24 0110  AST 57* 73*  ALT 35 41  ALKPHOS 58 66  BILITOT 1.3* 1.1  PROT 6.6 5.9*  ALBUMIN 3.2* 2.8*   Recent Labs    02/14/24 1132  LIPASE 30   Thyroid  Function Tests Recent Labs    02/14/24 1545  TSH 1.320  Medications:    Scheduled Medications:  atorvastatin  20 mg Oral QHS   Chlorhexidine Gluconate Cloth  6 each Topical Daily   cholecalciferol  1,000 Units Oral BID   cyclobenzaprine  10 mg Oral QHS   ferrous gluconate  324 mg Oral Q breakfast   furosemide  40 mg Intravenous BID   pantoprazole  40 mg Oral QAC breakfast   pregabalin  200 mg Oral TID   senna-docusate  2 tablet Oral QHS   sertraline  50 mg Oral Daily   sodium chloride flush  10-40 mL Intracatheter Q12H    Infusions:  heparin 1,050 Units/hr (02/17/24 1000)   norepinephrine (LEVOPHED) Adult infusion Stopped (02/17/24 0918)    PRN Medications: acetaminophen **OR** acetaminophen, albuterol, alum & mag hydroxide-simeth, bisacodyl, glucagon (human recombinant), hydrALAZINE, HYDROmorphone (DILAUDID) injection, hydrOXYzine, lip balm, ondansetron  **OR** ondansetron  (ZOFRAN ) IV, mouth rinse, oxyCODONE, senna-docusate, sodium chloride flush, sodium phosphate, traZODone  Patient Profile   63 y.o. female with history of obesity, OSA, HLD, osteoarthritis, and spinal stenosis s/p fusions.   Assessment/Plan   Bilateral Pulmonary Embolism with Cor Pulmonale - presented with syncope and lactic acidosis 4.9, now cleared - CTPE + for PE with significant RV strain s/p thrombectomy overnight - echo 10/1: EF 50-55% with RV strain and severely reduced RV, RVSP 42 - Venous  dopplers w/ b/l lower extremity DVT - continue heparin gtt for now >> switch to apixaban soon - CVP down to 7. Need to use caution not to overdiurese with RV failure. Already received 40 mg lasix IV today. Will hold off on further doses today - GDMT limited by renal function, now improved. Low volume status today - could consider digoxin if RV still down on echo   Hypotension - off NE this am. BP soft with intermittent lightheadedness. Continue to watch closely. D/C Aline - not accurate.   AKI - in the setting of hypotension, RV strain and now contrast - sCr improving with diuresis 2 > 1.8 > 1.7 > 1.36   Elevated Troponin - [phone] - suspect demand ischemia in the setting of acute b/l PE   HLD - continue atorva 20 mg daily   Urinary Retention - now has foley, uncertain details - Will remove and see if she can void on her own   OSA - using home CPAP   Obesity - Body mass index is 56.86 kg/m. - PCP has been working on GLP1 - [facility]  Groin Bleeding Anemia - 10/1 bleeding from femoral access site after sutures removed, required re-suturing and femstop - remove sutures prior to discharge - check iron studies tomorrow am    Length of Stay: 3  FINCH, LINDSAY N, PA-C  02/17/2024, 11:28 AM  Advanced Heart Failure Team Pager [phone] (M-F; 7a - 5p)  Please contact [facility] for night-coverage after hours (5p -7a ) and weekends on [email or web address]   Patient seen and examined with the above-signed Advanced Practice Provider and/or Housestaff. I personally reviewed laboratory data, imaging studies and relevant notes. I independently examined the patient and formulated the important aspects of the plan. I have edited the note to reflect any of my changes or salient points. I have personally discussed the plan with the patient and/or family.  Has diuresed very well. Now slightly lightheaded. CVP 7 co-ox ok.   Was able to stand   LE u/s +  DVT  Echo reviewed personally EF 55% RV mildly HK   General:  obese woman sitting in  chair No resp difficulty HEENT: normal Neck: supple. no JVD. Carotids 2+ bilat; no bruits. No lymphadenopathy or thryomegaly appreciated. Cor: PMI nondisplaced. Regular rate & rhythm. No rubs, gallops or murmurs. Lungs: clear Abdomen: obese soft, nontender, nondistended. No hepatosplenomegaly. No bruits or masses. Good bowel sounds. Extremities: no cyanosis, clubbing, rash, edema groin sites improving Neuro: alert & orientedx3, cranial nerves grossly intact. moves all 4 extremities w/o difficulty. Affect pleasant   She is improving steadily after thrombectomy for massive PE. RV function improved on echo. Now off pressors. No further diuresis. Can give some fluid back as needed.   Residual LE DVT does not appear large enough to warrant IVC filter  No role for cardiac cath. Can switch heparin to Eliquis.   Can take groin sutures out in next day or two.   Toribio Fuel, MD  8:52 PM

## 2024-02-17 NOTE — Progress Notes (Signed)
 PHARMACY - ANTICOAGULATION CONSULT NOTE  Pharmacy Consult for heparin Indication: pulmonary embolus  Allergies  Allergen Reactions   Naproxen Other (See Comments)    Nose bleeding    Patient Measurements: Height: 4' 11 (149.9 cm) Weight: 127.7 kg (281 lb 8.4 oz) IBW/kg (Calculated) : 43.2 HEPARIN DW (KG): 76.1  Vital Signs: Temp: 98.6 F (37 C) (10/03 0630) Temp Source: Bladder (10/03 0400) Pulse Rate: 72 (10/03 0630)  Labs: Recent Labs    02/14/24 1132 02/14/24 1147 02/14/24 1544 02/14/24 1545 02/15/24 0110 02/15/24 1600 02/16/24 0101 02/16/24 0500 02/16/24 1043 02/16/24 1801 02/17/24 0306 02/17/24 0325  HGB 11.0*   < >  --    < > 10.2* 8.9*  --  8.5*  --   --   --  7.9*  HCT 35.0*   < >  --    < > 32.7* 28.0*  --  27.3*  --   --   --  24.5*  PLT 295  --   --    < > 288 237  --  265  --   --   --  233  LABPROT 13.8  --   --   --   --   --   --   --   --   --   --   --   INR 1.0  --   --   --   --   --   --   --   --   --   --   --   HEPARINUNFRC  --   --   --   --   --   --    < >  --  0.88* 0.52 0.40  --   CREATININE 1.63*   < >  --    < > 2.01* 1.81*  --  1.75*  --   --   --  1.36*  TROPONINIHS 52*  --  332*  --  665* 596*  --   --   --   --   --   --    < > = values in this interval not displayed.    Estimated Creatinine Clearance: 50.1 mL/min (A) (by C-G formula based on SCr of 1.36 mg/dL (H)).   Medical History: Past Medical History:  Diagnosis Date   Hyperlipidemia    Knee pain    Overweight    Reflux     Medications:  Medications Prior to Admission  Medication Sig Dispense Refill Last Dose/Taking   atorvastatin (LIPITOR) 20 MG tablet Take 20 mg by mouth daily.   02/13/2024   cholecalciferol (VITAMIN D) 1000 UNITS tablet Take 1,000 Units by mouth 2 (two) times daily.   02/13/2024   Cyclobenzaprine HCl (FLEXERIL PO) Take 10 mg by mouth daily as needed (muscle spasms).   Past Week   esomeprazole (NEXIUM) 40 MG capsule Take 40 mg by mouth daily at  12 noon.   02/13/2024   esomeprazole (NEXIUM) 40 MG capsule Take 40 mg by mouth daily.   Past Week   fluticasone (FLONASE) 50 MCG/ACT nasal spray Place 1 spray into both nostrils daily.   Past Week   hydrochlorothiazide (HYDRODIURIL) 25 MG tablet Take 25 mg by mouth daily.   Past Week   meloxicam (MOBIC) 15 MG tablet Take 15 mg by mouth daily as needed for pain.   Past Month   polyethylene glycol (MIRALAX / GLYCOLAX) 17 g packet Take 17 g by mouth daily as needed  for mild constipation.   Past Week   pregabalin (LYRICA) 150 MG capsule Take 200 mg by mouth in the morning, at noon, and at bedtime.   02/13/2024   Semaglutide (RYBELSUS) 3 MG TABS Take 3 mg by mouth daily.   02/13/2024   sertraline (ZOLOFT) 50 MG tablet Take 50 mg by mouth daily.   02/13/2024   traMADol (ULTRAM) 50 MG tablet Take 50 mg by mouth every 12 (twelve) hours as needed for moderate pain (pain score 4-6) or severe pain (pain score 7-10).   Past Week   triamcinolone cream (KENALOG) 0.5 % Apply 1 application topically 3 (three) times daily.   Past Week   ascorbic acid (VITAMIN C) 100 MG tablet Take 1 tablet by mouth daily. (Patient not taking: Reported on 02/15/2024)   Not Taking   cyanocobalamin 1000 MCG tablet Take by mouth. (Patient not taking: Reported on 02/14/2024)   Not Taking   ferrous gluconate (FERGON) 324 MG tablet Take by mouth. (Patient not taking: Reported on 02/14/2024)   Not Taking   ibuprofen (ADVIL,MOTRIN) 200 MG tablet Take 200 mg by mouth every 6 (six) hours as needed. (Patient not taking: Reported on 02/14/2024)   Not Taking   Liraglutide -Weight Management (SAXENDA [place]) Inject 30 mg into the skin daily. (Patient not taking: Reported on 02/14/2024)   Not Taking   loratadine (CLARITIN) 10 MG tablet Take 10 mg by mouth daily. (Patient not taking: Reported on 07/31/2020)   Not Taking   methocarbamol (ROBAXIN) 500 MG tablet Take by mouth. (Patient not taking: Reported on 02/14/2024)   Not Taking   Scheduled:   atorvastatin   20 mg Oral QHS   Chlorhexidine Gluconate Cloth  6 each Topical Daily   cholecalciferol  1,000 Units Oral BID   cyclobenzaprine  10 mg Oral QHS   ferrous gluconate  324 mg Oral Q breakfast   furosemide  40 mg Intravenous BID   pantoprazole  40 mg Oral QAC breakfast   pregabalin  200 mg Oral TID   senna-docusate  2 tablet Oral QHS   sertraline  50 mg Oral Daily   sodium chloride flush  10-40 mL Intracatheter Q12H   Infusions:   heparin 1,050 Units/hr (02/17/24 0600)   norepinephrine (LEVOPHED) Adult infusion 3 mcg/min (02/17/24 0600)    Assessment: 63yo female presented to ED c/o syncopal episodes x3 at home then again w/ EMS, found to be hypotensive; in ED troponin was elevated and pt was started on heparin, which was subsequently d/c'd when cards determined the trop elevated was likely d/t injury 2/2 hypotension and shock; pt remained hypotensive despite aggressive fluid resuscitation; pt was moved to ICU for pressors, CT ordered which reveals extensive bilateral PE w/ significant RHS >> to resume heparin at VTE dosing.  02/17/24: Heparin level 0.40 at 0300, therapeutic but trending down from 0.52 on heparin 1050 units/hr. Rechecked heparin level at 1000, decreased further to 0.32, borderline subtherapeutic. No issues with infusion running or signs of bleeding per RN. Hgb decreased to 7.9, PLT stable at 233.    Goal of Therapy:  Heparin level 0.3-0.7 units/ml Monitor platelets by anticoagulation protocol: Yes   Plan:  -Increase heparin to 1150 units/hr due to down-trending heparin level -Heparin level and CBC daily -F/U plans for [facility] and ability to transition to PO [facility]  Morna Breach, PharmD PGY2 Cardiology Pharmacy Resident 02/17/2024 8:21 AM

## 2024-02-17 NOTE — Discharge Instructions (Signed)
 Information on my medicine - ELIQUIS (apixaban)  Why was Eliquis prescribed for you? Eliquis was prescribed to treat blood clots that may have been found in the veins of your legs (deep vein thrombosis) or in your lungs (pulmonary embolism) and to reduce the risk of them occurring again.  What do You need to know about Eliquis ? The starting dose is 10 mg (two 5 mg tablets) taken TWICE daily for the FIRST SEVEN (7) DAYS, then on 02/24/2024 the dose is reduced to ONE 5 mg tablet taken TWICE daily.  Eliquis may be taken with or without food.   Try to take the dose about the same time in the morning and in the evening. If you have difficulty swallowing the tablet whole please discuss with your pharmacist how to take the medication safely.  Take Eliquis exactly as prescribed and DO NOT stop taking Eliquis without talking to the doctor who prescribed the medication.  Stopping may increase your risk of developing a new blood clot.  Refill your prescription before you run out.  After discharge, you should have regular check-up appointments with your healthcare provider that is prescribing your Eliquis.    What do you do if you miss a dose? If a dose of ELIQUIS is not taken at the scheduled time, take it as soon as possible on the same day and twice-daily administration should be resumed. The dose should not be doubled to make up for a missed dose.  Important Safety Information A possible side effect of Eliquis is bleeding. You should call your healthcare provider right away if you experience any of the following: Bleeding from an injury or your nose that does not stop. Unusual colored urine (red or dark brown) or unusual colored stools (red or black). Unusual bruising for unknown reasons. A serious fall or if you hit your head (even if there is no bleeding).  Some medicines may interact with Eliquis and might increase your risk of bleeding or clotting while on Eliquis. To help avoid  this, consult your healthcare provider or pharmacist prior to using any new prescription or non-prescription medications, including herbals, vitamins, non-steroidal anti-inflammatory drugs (NSAIDs) and supplements.  This website has more information on Eliquis (apixaban): http://[email or web address].com/eliquis/home

## 2024-02-17 NOTE — Evaluation (Addendum)
 Physical Therapy Evaluation Patient Details Name: Breanna Riley MRN: [id number] DOB: 1961/03/27 Today's Date: 02/17/2024  History of Present Illness  63 y.o. female admitted 10/1 with Bilateral Pulmonary Embolism with Cor Pulmonale  - presented with syncope.  Bil DVT as well.  10/1 bleeding from femoral access site after sutures removed, required re-suturing and femstop.  PMH: obesity, OSA, HLD, osteoarthritis, and spinal stenosis s/p fusions.  Clinical Impression  Pt admitted with above diagnosis. Nurse messaged PT that she had gotten pt into chair and that she can mobilize per MD.  Pt was able to ambulate on unit with RW with min assist and chair follow. Pt with some right ankle pain however no LOB. Should progress well and pt very motivated to go home once medically treated.   Pt currently with functional limitations due to the deficits listed below (see PT Problem List). Pt will benefit from acute skilled PT to increase their independence and safety with mobility to allow discharge.           If plan is discharge home, recommend the following: Assistance with cooking/housework;Assist for transportation;Help with stairs or ramp for entrance   Can travel by private vehicle        Equipment Recommendations Rollator (4 wheels) (Needs standard rollator (not youth))  Recommendations for Other Services       Functional Status Assessment Patient has had a recent decline in their functional status and demonstrates the ability to make significant improvements in function in a reasonable and predictable amount of time.     Precautions / Restrictions Precautions Precautions: Fall      Mobility  Bed Mobility               General bed mobility comments: Pt in chair on arrival    Transfers Overall transfer level: Needs assistance Equipment used: Rolling walker (2 wheels) Transfers: Sit to/from Stand Sit to Stand: Min assist           General transfer comment: cues for hand  placement    Ambulation/Gait Ambulation/Gait assistance: Min assist, +2 safety/equipment Gait Distance (Feet): 70 Feet Assistive device: Rolling walker (2 wheels) Gait Pattern/deviations: Step-through pattern, Decreased stride length, Trunk flexed, Wide base of support   Gait velocity interpretation: <1.31 ft/sec, indicative of household ambulator   General Gait Details: Pt progressed ambulation to hallway with chair follow for safety.   Used RW with overall good stability and no LOB but did need cues for posture at times espeically with fatigue. Had to sit after 70 feet.  Stairs            Wheelchair Mobility     Tilt Bed    Modified Rankin (Stroke Patients Only)       Balance Overall balance assessment: Needs assistance Sitting-balance support: No upper extremity supported, Feet supported Sitting balance-Leahy Scale: Good     Standing balance support: Bilateral upper extremity supported, During functional activity Standing balance-Leahy Scale: Poor Standing balance comment: relies on UE support                             Pertinent Vitals/Pain Pain Assessment Pain Assessment: Faces Faces Pain Scale: Hurts a little bit Pain Location: right ankle Pain Descriptors / Indicators: Aching, Discomfort, Grimacing, Guarding Pain Intervention(s): Limited activity within patient's tolerance, Monitored during session    Home Living Family/patient expects to be discharged to:: Private residence Living Arrangements: Alone Available Help at Discharge: Family;Available PRN/intermittently (family works)  Type of Home: House Home Access: Level entry       Home Layout: One level Home Equipment: Cane - single point;Shower seat - built Char[employer] fundraiser (2 wheels);Rollator (4 wheels);BSC/3in1;Hand held shower head      Prior Function Prior Level of Function : Needs assist             Mobility Comments: needed help with housework,used rollator and cane ADLs  Comments: pt cooked, B/D self but difficult to get socks and shoes on     Extremity/Trunk Assessment   Upper Extremity Assessment Upper Extremity Assessment: Defer to OT evaluation    Lower Extremity Assessment Lower Extremity Assessment: Overall WFL for tasks assessed    Cervical / Trunk Assessment Cervical / Trunk Assessment: Normal  Communication   Communication Communication: No apparent difficulties    Cognition Arousal: Alert Behavior During Therapy: WFL for tasks assessed/performed   PT - Cognitive impairments: No apparent impairments                         Following commands: Intact       Cueing       General Comments General comments (skin integrity, edema, etc.): 83 bpm, 113/97. 96% 5LO2    Exercises General Exercises - Lower Extremity Ankle Circles/Pumps: AROM, Both, 10 reps, Supine Long Arc Quad: AROM, Both, 10 reps, Seated   Assessment/Plan    PT Assessment Patient needs continued PT services  PT Problem List Decreased activity tolerance;Decreased balance;Decreased mobility;Decreased knowledge of use of DME;Decreased safety awareness;Decreased knowledge of precautions;Cardiopulmonary status limiting activity       PT Treatment Interventions DME instruction;Gait training;Stair training;Functional mobility training;Therapeutic activities;Therapeutic exercise;Balance training;Patient/family education    PT Goals (Current goals can be found in the Care Plan section)  Acute Rehab PT Goals Patient Stated Goal: to go home PT Goal Formulation: With patient Time For Goal Achievement: 03/02/24 Potential to Achieve Goals: Good    Frequency Min 2X/week     Co-evaluation               AM-PAC PT 6 Clicks Mobility  Outcome Measure Help needed turning from your back to your side while in a flat bed without using bedrails?: A Little Help needed moving from lying on your back to sitting on the side of a flat bed without using bedrails?: A  Little Help needed moving to and from a bed to a chair (including a wheelchair)?: A Little Help needed standing up from a chair using your arms (e.g., wheelchair or bedside chair)?: A Little Help needed to walk in hospital room?: A Lot Help needed climbing 3-5 steps with a railing? : A Lot 6 Click Score: 16    End of Session Equipment Utilized During Treatment: Gait belt;Oxygen Activity Tolerance: Patient limited by fatigue Patient left: in chair;with call bell/phone within reach;with chair alarm set;with family/visitor present Nurse Communication: Mobility status PT Visit Diagnosis: Muscle weakness (generalized) (M62.81)    Time: 8790-8760 PT Time Calculation (min) (ACUTE ONLY): 30 min   Charges:   PT Evaluation $PT Eval Moderate Complexity: 1 Mod PT Treatments $Gait Training: 8-22 mins PT General Charges $$ ACUTE PT VISIT: 1 Visit         Leela Vanbrocklin M,PT Acute Rehab Services [phone]   Stephane JULIANNA Bevel 02/17/2024, 3:43 PM

## 2024-02-18 DIAGNOSIS — D649 Anemia, unspecified: Secondary | ICD-10-CM | POA: Diagnosis not present

## 2024-02-18 DIAGNOSIS — I2602 Saddle embolus of pulmonary artery with acute cor pulmonale: Secondary | ICD-10-CM | POA: Diagnosis not present

## 2024-02-18 DIAGNOSIS — D62 Acute posthemorrhagic anemia: Secondary | ICD-10-CM

## 2024-02-18 DIAGNOSIS — I959 Hypotension, unspecified: Secondary | ICD-10-CM | POA: Diagnosis not present

## 2024-02-18 DIAGNOSIS — A419 Sepsis, unspecified organism: Secondary | ICD-10-CM

## 2024-02-18 LAB — IRON AND TIBC
Iron: 22 ug/dL — ABNORMAL LOW (ref 28–170)
Saturation Ratios: 8 % — ABNORMAL LOW (ref 10.4–31.8)
TIBC: 279 ug/dL (ref 250–450)
UIBC: 257 ug/dL

## 2024-02-18 LAB — BASIC METABOLIC PANEL WITH GFR
Anion gap: 10 (ref 5–15)
BUN: 14 mg/dL (ref 8–23)
CO2: 28 mmol/L (ref 22–32)
Calcium: 8.8 mg/dL — ABNORMAL LOW (ref 8.9–10.3)
Chloride: 98 mmol/L (ref 98–111)
Creatinine, Ser: 1.13 mg/dL — ABNORMAL HIGH (ref 0.44–1.00)
GFR, Estimated: 54 mL/min — ABNORMAL LOW (ref >60)
Glucose, Bld: 91 mg/dL (ref 70–99)
Potassium: 4 mmol/L (ref 3.5–5.1)
Sodium: 136 mmol/L (ref 135–145)

## 2024-02-18 LAB — FERRITIN: Ferritin: 297 ng/mL (ref 11–307)

## 2024-02-18 LAB — CBC
HCT: 24.9 % — ABNORMAL LOW (ref 36.0–46.0)
Hemoglobin: 7.9 g/dL — ABNORMAL LOW (ref 12.0–15.0)
MCH: 28.1 pg (ref 26.0–34.0)
MCHC: 31.7 g/dL (ref 30.0–36.0)
MCV: 88.6 fL (ref 80.0–100.0)
Platelets: 221 K/uL (ref 150–400)
RBC: 2.81 MIL/uL — ABNORMAL LOW (ref 3.87–5.11)
RDW: 13.9 % (ref 11.5–15.5)
WBC: 5.4 K/uL (ref 4.0–10.5)
nRBC: 0 % (ref 0.0–0.2)

## 2024-02-18 LAB — COOXEMETRY PANEL
Carboxyhemoglobin: 0.9 % (ref 0.5–1.5)
Methemoglobin: <0.7 % (ref 0.0–1.5)
O2 Saturation: 52.9 %
Total hemoglobin: 8.5 g/dL — ABNORMAL LOW (ref 12.0–16.0)

## 2024-02-18 LAB — MAGNESIUM: Magnesium: 2.3 mg/dL (ref 1.7–2.4)

## 2024-02-18 LAB — GLUCOSE, CAPILLARY: Glucose-Capillary: 81 mg/dL (ref 70–99)

## 2024-02-18 MED ORDER — SENNA 8.6 MG PO TABS
1.0000 | ORAL_TABLET | Freq: Two times a day (BID) | ORAL | Status: DC
  Administered 2024-02-18 – 2024-02-28 (×9): 8.6 mg via ORAL
  Filled 2024-02-18 (×16): qty 1

## 2024-02-18 MED ORDER — MIDODRINE HCL 5 MG PO TABS
2.5000 mg | ORAL_TABLET | Freq: Two times a day (BID) | ORAL | Status: DC
  Administered 2024-02-18 – 2024-02-22 (×10): 2.5 mg via ORAL
  Filled 2024-02-18 (×10): qty 1

## 2024-02-18 MED ORDER — SORBITOL 70 % SOLN
60.0000 mL | Freq: Once | Status: AC
  Administered 2024-02-18: 60 mL via ORAL
  Filled 2024-02-18: qty 60

## 2024-02-18 MED ORDER — POLYETHYLENE GLYCOL 3350 17 G PO PACK
17.0000 g | PACK | Freq: Two times a day (BID) | ORAL | Status: DC
  Administered 2024-02-18 – 2024-02-28 (×7): 17 g via ORAL
  Filled 2024-02-18 (×15): qty 1

## 2024-02-18 NOTE — Progress Notes (Signed)
   NAME:  Breanna Riley, MRN:  [id number], DOB:  07-Dec-1960, LOS: 4 ADMISSION DATE:  02/14/2024, CONSULTATION DATE:  02/15/2024 REFERRING MD:  Redia Cleaver, MD, CHIEF COMPLAINT:  Hypotension  History of Present Illness:  63 y/o female with PMH for OSA, Obseity, HLD  who presented with syncope and was admitted to Hospitalist service.  She has [facility] and was started on hydrochlorothiazide 25 mg a few days ago.  She started with Lightheadedness 2 days ago and then had 2 episodes of syncope last about 1 min.  Lab work on admission showing AKI and increased LA.  She was started on IV fluids and she has been given 5 liters and still remains hypotensive.  She was started on Levophed.  Pertinent  Medical History  OSA, Obseity, HLD    Significant Hospital Events: Including procedures, antibiotic start and stop dates in addition to other pertinent events   10/1: transfer to ICU, s/p thrombectomy, requiring norepi 10/2 stable but still on levo 10mcg 10/3 weaning levophed as tolerated, + right/left DVT Lower extremities   Interim History / Subjective:  No events. Slept okay. Constipated.  Objective    Blood pressure (!) 95/57, pulse 88, temperature 100.3 F (37.9 C), temperature source Oral, resp. rate 17, height 4' 11 (1.499 m), weight 127.7 kg, SpO2 93%. CVP:  [1 mmHg-18 mmHg] 1 mmHg  FiO2 (%):  [38 %] 38 %   Intake/Output Summary (Last 24 hours) at 02/18/2024 1207 Last data filed at 02/18/2024 0900 Gross per 24 hour  Intake 1061.74 ml  Output 1500 ml  Net -438.26 ml   Filed Weights   02/14/24 1201 02/14/24 1835  Weight: 123.8 kg 127.7 kg   No distress Moves to command RASS 0 Good insight Still needs 2LPM for sats > 90% Abd soft hypoactive BS Defer groin site eval to AHF  BMP ok Coox 53%  Resolved problem list   Assessment and Plan   Syncope and obstructive shock secondary to extensive bilateral Pulmonary Emboli with cor pulmonale and  acute hypoxic respiratory failure s/p Thrombectomy 10/1.  Echo with improved function.  Some lingering hypoxemia but improving. Class 3 obesity AKI-improving, multifactorial GERD OSA Depression/Anxiety ABLA 2/2 bleeding from access site- resolved R foot pain- x-ray neg, seems to be a sprain  - Dr. Cherrie to remove suture from access site - Continue eliquis - Wean O2 for sats > 90% - PT/OT consult, if still having issues with weight bearing RLE consider MRI or CT - If Cr better tomorrow consider additional diuresis - Stable to leave ICU, appreciate [facility] taking over 02/19/24  Rolan Sharps MD PCCM

## 2024-02-18 NOTE — Evaluation (Signed)
 Occupational Therapy Evaluation Patient Details Name: Breanna Riley MRN: [id number] DOB: 30-Aug-1960 Today's Date: 02/18/2024   History of Present Illness   63 y.o. female admitted 10/1 with Bilateral Pulmonary Embolism with Cor Pulmonale  - presented with syncope.  Bil DVT as well.  10/1 bleeding from femoral access site after sutures removed, required re-suturing and femstop.  PMH: obesity, OSA, HLD, osteoarthritis, and spinal stenosis s/p fusions.     Clinical Impressions Pt using rollator and cane at  baseline for mobility, ind with ADLs, pt lives alone but son comes over daily. Pt currently needing up to mod A for ADLs, and CGA for transfers with RW. Pt with SpO2 down to high 70s on RA seated on commode, incr  to 90s on 2L O2. Pt presenting with impairments listed below, will follow acutely. Patient will benefit from intensive inpatient follow-up therapy, >3 hours/day.      If plan is discharge home, recommend the following:   A little help with walking and/or transfers;A lot of help with bathing/dressing/bathroom;Assistance with cooking/housework;Direct supervision/assist for medications management;Direct supervision/assist for financial management;Assist for transportation;Help with stairs or ramp for entrance     Functional Status Assessment   Patient has had a recent decline in their functional status and demonstrates the ability to make significant improvements in function in a reasonable and predictable amount of time.     Equipment Recommendations   Other (comment) (rollator)     Recommendations for Other Services   PT consult     Precautions/Restrictions   Precautions Precautions: Fall Restrictions Weight Bearing Restrictions Per Provider Order: No     Mobility Bed Mobility               General bed mobility comments: Pt in chair on arrival    Transfers Overall transfer level: Needs assistance Equipment used: Rolling walker (2  wheels) Transfers: Sit to/from Stand Sit to Stand: Contact guard assist                  Balance Overall balance assessment: Needs assistance Sitting-balance support: No upper extremity supported, Feet supported Sitting balance-Leahy Scale: Good     Standing balance support: Bilateral upper extremity supported, During functional activity Standing balance-Leahy Scale: Poor Standing balance comment: relies on UE support                           ADL either performed or assessed with clinical judgement   ADL Overall ADL's : Needs assistance/impaired Eating/Feeding: Set up;Sitting   Grooming: Set up;Sitting   Upper Body Bathing: Minimal assistance;Sitting   Lower Body Bathing: Moderate assistance;Sitting/lateral leans   Upper Body Dressing : Minimal assistance;Sitting   Lower Body Dressing: Moderate assistance;Sitting/lateral leans   Toilet Transfer: Contact guard assist;Ambulation;Rolling walker (2 wheels);Regular Toilet   Toileting- Clothing Manipulation and Hygiene: Moderate assistance       Functional mobility during ADLs: Contact guard assist;Rolling walker (2 wheels)       Vision   Vision Assessment?: No apparent visual deficits     Perception Perception: Not tested       Praxis Praxis: Not tested       Pertinent Vitals/Pain Pain Assessment Pain Assessment: Faces Pain Score: 4  Faces Pain Scale: Hurts little more Pain Location: back Pain Descriptors / Indicators: Aching, Discomfort, Grimacing, Guarding Pain Intervention(s): Limited activity within patient's tolerance, Monitored during session, Repositioned     Extremity/Trunk Assessment Upper Extremity Assessment Upper Extremity Assessment: Generalized weakness  Lower Extremity Assessment Lower Extremity Assessment: Defer to PT evaluation   Cervical / Trunk Assessment Cervical / Trunk Assessment: Normal   Communication Communication Communication: No apparent difficulties    Cognition Arousal: Alert Behavior During Therapy: WFL for tasks assessed/performed Cognition: No apparent impairments                               Following commands: Intact       Cueing  General Comments   Cueing Techniques: Verbal cues  SpO2 down to 70s on RA, incr to 90s on 2L O2   Exercises     Shoulder Instructions      Home Living Family/patient expects to be discharged to:: Private residence Living Arrangements: Alone Available Help at Discharge: Family;Available PRN/intermittently (son comes over daily) Type of Home: House Home Access: Stairs to enter [employer] of Steps: 2   Home Layout: One level     Bathroom Shower/Tub: Walk-in shower;Tub/shower unit   Bathroom Toilet: Handicapped height     Home Equipment: Cane - single point;Shower seat - built Char[employer] fundraiser (2 wheels);Rollator (4 wheels);BSC/3in1;Hand held shower head          Prior Functioning/Environment Prior Level of Function : Needs assist             Mobility Comments: needed help with housework,used rollator and cane ADLs Comments: pt cooked, B/D self but difficult to get socks and shoes on    OT Problem List: Decreased strength;Decreased range of motion;Decreased activity tolerance;Impaired balance (sitting and/or standing);Decreased coordination;Decreased cognition;Decreased safety awareness   OT Treatment/Interventions: Self-care/ADL training;Therapeutic exercise;Energy conservation;DME and/or AE instruction;Therapeutic activities;Balance training;Patient/family education      OT Goals(Current goals can be found in the care plan section)   Acute Rehab OT Goals Patient Stated Goal: none stated OT Goal Formulation: With patient Time For Goal Achievement: 03/03/24 Potential to Achieve Goals: Good ADL Goals Pt Will Perform Upper Body Dressing: with supervision;sitting Pt Will Perform Lower Body Dressing: with min assist;sitting/lateral leans;sit  to/from stand Pt Will Transfer to Toilet: with supervision;ambulating;regular height toilet Pt Will Perform Tub/Shower Transfer: with min assist;ambulating;Tub transfer;Shower transfer Additional ADL Goal #1: Pt will tolerate OOB standing activity x10 min in order to improve activity tolerance for ADLs   OT Frequency:  Min 2X/week    Co-evaluation              AM-PAC OT 6 Clicks Daily Activity     Outcome Measure Help from another person eating meals?: A Little Help from another person taking care of personal grooming?: A Little Help from another person toileting, which includes using toliet, bedpan, or urinal?: A Lot Help from another person bathing (including washing, rinsing, drying)?: A Lot Help from another person to put on and taking off regular upper body clothing?: A Little Help from another person to put on and taking off regular lower body clothing?: A Lot 6 Click Score: 15   End of Session Equipment Utilized During Treatment: Rolling walker (2 wheels);Oxygen (2L) Nurse Communication: Mobility status  Activity Tolerance: Patient tolerated treatment well Patient left: Other (comment);with nursing/sitter in room;with family/visitor present (in bathroom on commode)  OT Visit Diagnosis: Unsteadiness on feet (R26.81);Other abnormalities of gait and mobility (R26.89);Muscle weakness (generalized) (M62.81)                Time: 8581-8548 OT Time Calculation (min): 33 min Charges:  OT General Charges $OT Visit: 1 Visit OT Evaluation $OT  Eval Moderate Complexity: 1 Mod OT Treatments $Self Care/Home Management : 8-22 mins  Breanna Riley, OTD, OTR/L SecureChat Preferred Acute Rehab [phone]   Breanna Riley 02/18/2024, 3:56 PM

## 2024-02-18 NOTE — Progress Notes (Signed)
 Advanced Heart Failure Rounding Note  HF Cardiologist: Dr. Cherrie  Chief Complaint: Cor Pulmonale   Subjective:    Feeling better. Able to stand and walk.  BP soft.   No bleeding. No CP or SOB   Echo reviewed personally EF 55% RV mildly HK   Objective:    Weight Range: 127.7 kg Body mass index is 56.86 kg/m.   Vital Signs:   Temp:  [98.3 F (36.8 C)-100.3 F (37.9 C)] 99.3 F (37.4 C) (10/04 1948) Pulse Rate:  [86-110] 110 (10/04 1948) Resp:  [12-26] 20 (10/04 1948) BP: (82-116)/(47-85) 101/67 (10/04 1948) SpO2:  [90 %-98 %] 94 % (10/04 1948) FiO2 (%):  [38 %] 38 % (10/04 0400) Last BM Date : 02/18/24  Weight change: Filed Weights   02/14/24 1201 02/14/24 1835  Weight: 123.8 kg 127.7 kg   Intake/Output:  Intake/Output Summary (Last 24 hours) at 02/18/2024 2129 Last data filed at 02/18/2024 1500 Gross per 24 hour  Intake 720 ml  Output 1350 ml  Net -630 ml    Physical Exam    General:  Obese woman sitting in chair No resp difficulty HEENT: normal Neck: supple. no obvious JVD. Carotids 2+ bilat; no bruits. No lymphadenopathy or thryomegaly appreciated. Cor:REg tachy  Lungs: clear Abdomen: obese soft, nontender, nondistended. No hepatosplenomegaly. No bruits or masses. Good bowel sounds. Extremities: no cyanosis, clubbing, rash, edema Gorin site ok  Neuro: alert & orientedx3, cranial nerves grossly intact. moves all 4 extremities w/o difficulty. Affect pleasan   Telemetry   Sinus 90-110 Personally reviewed   Labs    CBC Recent Labs    02/17/24 0325 02/18/24 0420  WBC 6.8 5.4  HGB 7.9* 7.9*  HCT 24.5* 24.9*  MCV 87.8 88.6  PLT 233 221   Basic Metabolic Panel Recent Labs    89/96/74 0325 02/18/24 0420  NA 134* 136  K 3.6 4.0  CL 101 98  CO2 27 28  GLUCOSE 100* 91  BUN 15 14  CREATININE 1.36* 1.13*  CALCIUM 8.3* 8.8*  MG 2.5* 2.3   Liver Function Tests No results for input(s): AST, ALT, ALKPHOS, BILITOT, PROT,  ALBUMIN in the last 72 hours.  No results for input(s): LIPASE, AMYLASE in the last 72 hours.  Thyroid  Function Tests No results for input(s): TSH, T4TOTAL, T3FREE, THYROIDAB in the last 72 hours.  Invalid input(s): FREET3  Medications:    Scheduled Medications:  apixaban  10 mg Oral BID   Followed by   NOREEN ON 02/24/2024] apixaban  5 mg Oral BID   atorvastatin  20 mg Oral QHS   Chlorhexidine Gluconate Cloth  6 each Topical Daily   cholecalciferol  1,000 Units Oral BID   cyclobenzaprine  10 mg Oral QHS   ferrous gluconate  324 mg Oral Q breakfast   midodrine  2.5 mg Oral BID WC   pantoprazole  40 mg Oral QAC breakfast   polyethylene glycol  17 g Oral BID   pregabalin  200 mg Oral TID   senna  1 tablet Oral BID   sertraline  50 mg Oral Daily   sodium chloride flush  10-40 mL Intracatheter Q12H    Infusions:    PRN Medications: acetaminophen **OR** acetaminophen, albuterol, alum & mag hydroxide-simeth, glucagon (human recombinant), hydrALAZINE, HYDROmorphone (DILAUDID) injection, hydrOXYzine, lip balm, ondansetron  **OR** ondansetron  (ZOFRAN ) IV, mouth rinse, oxyCODONE, sodium chloride flush, sodium phosphate, traZODone  Patient Profile   63 y.o. female with history of obesity, OSA, HLD, osteoarthritis, and  spinal stenosis s/p fusions.   Assessment/Plan   Bilateral Pulmonary Embolism with Cor Pulmonale - presented with syncope and lactic acidosis 4.9, now cleared - CTPE + for PE with significant RV strain s/p thrombectomy overnight - echo 10/1: EF 50-55% with RV strain and severely reduced RV, RVSP 42 - Echo 10/3 EF 55% RV mildly HK  - Venous dopplers w/ b/l lower extremity DVT - Continue apixaban - RV failure improving. Can restart diuretics if needed   Hypotension - improving but still soft - add midodrine 2.5 bid for now -avoid overdiuresis   AKI - in the setting of hypotension, RV strain and now contrast - resolved Elevated Troponin -  [phone] - suspect demand ischemia in the setting of acute b/l PE   HLD - continue atorva 20 mg daily   OSA - using home CPAP   Obesity - Body mass index is 56.86 kg/m. - PCP has been working on GLP1 - [facility]  Groin Bleeding Iron-def Anemia - 10/1 bleeding from femoral access site after sutures removed, required re-suturing and femstop - will need to remove sutures prior to discharge - Irons stores low. Consider IV iron   Much improved AHF team will sign off. Patient will need groin sutures removed prior to d/c.     Length of Stay: 4  Toribio Fuel, MD  02/18/2024, 9:29 PM  Advanced Heart Failure Team Pager [phone] (M-F; 7a - 5p)  Please contact [facility] for night-coverage after hours (5p -7a ) and weekends on [email or web address]   9:29 PM

## 2024-02-19 DIAGNOSIS — E785 Hyperlipidemia, unspecified: Secondary | ICD-10-CM

## 2024-02-19 DIAGNOSIS — D509 Iron deficiency anemia, unspecified: Secondary | ICD-10-CM

## 2024-02-19 DIAGNOSIS — N179 Acute kidney failure, unspecified: Secondary | ICD-10-CM

## 2024-02-19 DIAGNOSIS — I10 Essential (primary) hypertension: Secondary | ICD-10-CM | POA: Diagnosis not present

## 2024-02-19 DIAGNOSIS — E66813 Obesity, class 3: Secondary | ICD-10-CM

## 2024-02-19 DIAGNOSIS — I2609 Other pulmonary embolism with acute cor pulmonale: Secondary | ICD-10-CM | POA: Diagnosis not present

## 2024-02-19 DIAGNOSIS — F32A Depression, unspecified: Secondary | ICD-10-CM

## 2024-02-19 DIAGNOSIS — K219 Gastro-esophageal reflux disease without esophagitis: Secondary | ICD-10-CM

## 2024-02-19 LAB — CBC
HCT: 27.6 % — ABNORMAL LOW (ref 36.0–46.0)
Hemoglobin: 8.8 g/dL — ABNORMAL LOW (ref 12.0–15.0)
MCH: 27.9 pg (ref 26.0–34.0)
MCHC: 31.9 g/dL (ref 30.0–36.0)
MCV: 87.6 fL (ref 80.0–100.0)
Platelets: 241 K/uL (ref 150–400)
RBC: 3.15 MIL/uL — ABNORMAL LOW (ref 3.87–5.11)
RDW: 14.6 % (ref 11.5–15.5)
WBC: 5.5 K/uL (ref 4.0–10.5)
nRBC: 0 % (ref 0.0–0.2)

## 2024-02-19 LAB — BASIC METABOLIC PANEL WITH GFR
Anion gap: 8 (ref 5–15)
BUN: 14 mg/dL (ref 8–23)
CO2: 29 mmol/L (ref 22–32)
Calcium: 9.5 mg/dL (ref 8.9–10.3)
Chloride: 101 mmol/L (ref 98–111)
Creatinine, Ser: 1.22 mg/dL — ABNORMAL HIGH (ref 0.44–1.00)
GFR, Estimated: 49 mL/min — ABNORMAL LOW (ref >60)
Glucose, Bld: 92 mg/dL (ref 70–99)
Potassium: 4.2 mmol/L (ref 3.5–5.1)
Sodium: 138 mmol/L (ref 135–145)

## 2024-02-19 LAB — COOXEMETRY PANEL
Carboxyhemoglobin: 1.2 % (ref 0.5–1.5)
Methemoglobin: 1.5 % (ref 0.0–1.5)
O2 Saturation: 49.9 %
Total hemoglobin: 9.2 g/dL — ABNORMAL LOW (ref 12.0–16.0)

## 2024-02-19 LAB — CULTURE, BLOOD (ROUTINE X 2)
Culture: NO GROWTH
Culture: NO GROWTH
Special Requests: ADEQUATE

## 2024-02-19 LAB — MAGNESIUM: Magnesium: 1.9 mg/dL (ref 1.7–2.4)

## 2024-02-19 MED ORDER — SODIUM CHLORIDE 0.9 % IV SOLN
200.0000 mg | Freq: Once | INTRAVENOUS | Status: AC
  Administered 2024-02-19: 200 mg via INTRAVENOUS
  Filled 2024-02-19: qty 10

## 2024-02-19 MED ORDER — IRON SUCROSE 200 MG IVPB - SIMPLE MED
200.0000 mg | Freq: Once | Status: DC
  Filled 2024-02-19: qty 110

## 2024-02-19 NOTE — Hospital Course (Signed)
 Mrs. Tello was admitted to the hospital with massive pulmonary embolism.   63 y/o female with PMH for OSA, Obseity, HLD who presented with syncope. Patient had 3 syncope episodes at home, with no apparent prodrome. EMS was called, and while they at the site she had another other episodes of loss of consciousness, apparently her blood pressure systolic was 72 mmHg. While on route she has another episode of syncope along with blood pressure 50/80, she received 250 ml NS IV and placed on epinephrine drip.  On her initial physical examination she was lethargic but responsive.  Blood pressure 93/64, HR 122, RR 25 and 02 saturation 94%. Lungs with no wheezing or rhonchi, heart with S1 and S2 present and regular, abdomen with no distention, no lower extremity edema.   Na 140, K 2.9 Cl 104 bicarbonate 20, glucose 248 bun 15 cr 1.63  AT 57 ALT 35  High sensitive troponin 52  Lactic acid 4.9  Wbc11.6 hgb 11.0 plt 295   Sars covid 19 negative Influenza negative RSV negative   Urine analysis SG 1,015, protein 100, leukocytes negative, hgb negative   Chest radiograph with hypoinflation, with mild bilateral hilar vascular congestion. CT chest with extensive bilateral pulmonary emboli extending from the distal right and left main pulmonary arteries into lobar and segmental branches with significant right heart strain.  CT head with no acute changes.   EKG 104 bpm, normal axis, normal intervals, qtc 483, sinus rhythm with ST depression and T wave inversion V1 to V3.   She was started on IV fluids and she has been given 5 liters and still remains hypotensive. She was started on Levophed.   Echo 10/1: EF 50-55% with RV strain and severely reduced RV, RVSP 42 10/01 bilateral pulmonary artery thrombectomy via R CFV  10/1: transfer to ICU, s/p thrombectomy, requiring 6mcg norepinephrine.  10/2 stable but still on levo 10mcg 10/3 weaning levophed as tolerated, + right/left DVT Lower extremities   10/05  transferred to [facility].  10/06 patient recovering well, continue very weak and deconditioned.  10/07 picc line removed, right groin sutures removed.

## 2024-02-19 NOTE — Assessment & Plan Note (Signed)
Calculated BMI is 56.8

## 2024-02-19 NOTE — Plan of Care (Signed)

## 2024-02-19 NOTE — Assessment & Plan Note (Signed)
 Continue blood pressure support with midodrine.  Continue blood pressure monitoring.

## 2024-02-19 NOTE — Progress Notes (Signed)
 Progress Note   Patient: Breanna Riley FMW:[id number] DOB: 1960/09/04 DOA: 02/14/2024     5 DOS: the patient was seen and examined on 02/19/2024   Brief hospital course: Breanna Riley was admitted to the hospital with massive pulmonary embolism.   63 y/o female with PMH for OSA, Obseity, HLD who presented with syncope and was admitted to Hospitalist service. She has lost 30 lbs [facility] and was started on hydrochlorothiazide 25 mg a few days ago. She started with Lightheadedness 2 days ago and then had 2 episodes of syncope last about 1 min. Lab work on admission showing AKI and increased LA. She was started on IV fluids and she has been given 5 liters and still remains hypotensive. She was started on Levophed.  Chest CT with positive pulmonary embolism with significant RV strain Echo 10/1: EF 50-55% with RV strain and severely reduced RV, RVSP 42 10/01 bilateral pulmonary artery thrombectomy via R CFV  10/1: transfer to ICU, s/p thrombectomy, requiring 6mcg norepinephrine.  10/2 stable but still on levo 10mcg 10/3 weaning levophed as tolerated, + right/left DVT Lower extremities   10/05 transferred to [facility].   Assessment and Plan: * Acute pulmonary embolism with acute cor pulmonale (HCC) Complicated with Acute cor pulmonale  Echocardiogram with preserved LV systolic function with EF 50 to 55%, diastolic dysfunction grade I with impaired relaxation, interventricular septum flattened in systole and diastole, D shaped LV, positive McConnel sing, severe reduction in RV systolic function, RV cavity with severe dilatation, RVSP 42.0 mmHg.  Moderate to severe tricuspid valve regurgitation. RA with mild to moderate dilatation. No pericardial effusion.    Urine output is 1,100 ml Systolic blood pressure 90 mmHg systolic.  SV02 49,9   Plan to continue midodrine for blood pressure support.  Hold on diuretic therapy  Anticoagulation with apixaban.   Bilateral lower extremities deep  vein thrombosis.  Right common femoral vein  Left posterior tibial veins.    Essential hypertension Continue blood pressure support with midodrine.  Continue blood pressure monitoring.   AKI (acute kidney injury) Hyponatremia.   Renal function with serum cr at 1,22 with K at 4,2 and serum bicarbonate at 29  Na 138 and Mg 1,9   Plan to hold on diuretic therapy for now Continue close follow up renal function and electrolytes.   Dyslipidemia Continue with statin therapy .   GERD (gastroesophageal reflux disease) Continue with pantoprazole   Depression Continue with sertraline   Iron deficiency anemia Iron panel with serum iron of 22, TIBC 279, transferrin saturation 8 and ferritin 297 Will hold on po iron supplementation and will give IV iron infusion.  Follow iron panel as outpatient Hgb stable at 8,8   Obesity, class 3 (HCC) Calculated BMI is 56.8         Subjective: Patient is feeling better, has no chest pain or dyspnea at rest, yesterday she has been out of bed.   Physical Exam: Vitals:   02/18/24 1948 02/19/24 0007 02/19/24 0634 02/19/24 0831  BP: 101/67 95/69 (!) 96/51 (!) 90/58  Pulse: (!) 110 97 94   Resp: 20 20 19 18   Temp: 99.3 F (37.4 C) 98.1 F (36.7 C) 98.6 F (37 C) 97.9 F (36.6 C)  TempSrc: Oral Oral Oral Oral  SpO2: 94% 92% 91%   Weight:      Height:       Neurology awake and alert ENT with mild pallor with no icterus Cardiovascular with S1 and S2 present  and regular with no gallops, rubs or murmurs, distant heart sounds Respiratory with no rales or wheezing, no rhonchi  Abdomen with no distention, protuberant, soft and non tender Right groin with suture in place, no local erythema, edema or ecchymosis  No lower extremity edema, (likely some chronic lymphedema)   Data Reviewed:    Family Communication: I spoke with patient's son at the bedside, we talked in detail about patient's condition, plan of care and prognosis and all  questions were addressed.   Disposition: Status is: Inpatient Remains inpatient appropriate because: recovering shock   Planned Discharge Destination: Home    Author: Elidia Toribio Furnace, MD 02/19/2024 10:40 AM  For on call review [email or web address].

## 2024-02-19 NOTE — Assessment & Plan Note (Signed)
 Iron panel with serum iron of 22, TIBC 279, transferrin saturation 8 and ferritin 297 Will hold on po iron supplementation and will give IV iron infusion.  Follow iron panel as outpatient Hgb stable at 8,8

## 2024-02-19 NOTE — Assessment & Plan Note (Signed)
 Continue with pantoprazole 

## 2024-02-19 NOTE — Assessment & Plan Note (Signed)
Continue with sertraline  

## 2024-02-19 NOTE — Assessment & Plan Note (Signed)
 Continue with statin therapy.  ?

## 2024-02-19 NOTE — Assessment & Plan Note (Addendum)
 Complicated with Acute cor pulmonale  Echocardiogram with preserved LV systolic function with EF 50 to 55%, diastolic dysfunction grade I with impaired relaxation, interventricular septum flattened in systole and diastole, D shaped LV, positive McConnel sing, severe reduction in RV systolic function, RV cavity with severe dilatation, RVSP 42.0 mmHg.  Moderate to severe tricuspid valve regurgitation. RA with mild to moderate dilatation. No pericardial effusion.    Urine output is 1,100 ml Systolic blood pressure 90 mmHg systolic.  SV02 49,9   Plan to continue midodrine for blood pressure support.  Hold on diuretic therapy  Anticoagulation with apixaban.   Bilateral lower extremities deep vein thrombosis.  Right common femoral vein  Left posterior tibial veins.

## 2024-02-19 NOTE — Progress Notes (Signed)
 Occupational Therapy Treatment Patient Details Name: Breanna Riley MRN: [id number] DOB: 10-17-1960 Today's Date: 02/19/2024   History of present illness 63 y.o. female admitted 10/1 with Bilateral Pulmonary Embolism with Cor Pulmonale  - presented with syncope.  Bil DVT as well.  10/1 bleeding from femoral access site after sutures removed, required re-suturing and femstop.  PMH: obesity, OSA, HLD, osteoarthritis, and spinal stenosis s/p fusions.   OT comments  Pt. Seen for skilled OT treatment session.  Pt. Had just returned to bed from b.room with CNA.  Agreeable to participation.  Orthostatic BPs taken.  See below for details.  Pt. Reports she is not symptomatic even with changes in BP with positional changes.  Session focused on fall prevention/energy conservation strategies along with use of A/E for LB ADLs.  Pt. Receptive to all education and reports she has good family support from sons and sister as needed.  Cont. With acute OT POC.    Supine: 109/61-97 Sit: 86/60-98 Partial stand then had to sit from reported back fatigue: 91/41      If plan is discharge home, recommend the following:  A little help with walking and/or transfers;A lot of help with bathing/dressing/bathroom;Assistance with cooking/housework;Direct supervision/assist for medications management;Direct supervision/assist for financial management;Assist for transportation;Help with stairs or ramp for entrance   Equipment Recommendations       Recommendations for Other Services PT consult    Precautions / Restrictions Precautions Precautions: Fall       Mobility Bed Mobility Overal bed mobility: Modified Independent             General bed mobility comments: has adj. bed at home, utilizes functions to get in/out of bed. able to scoot and position herself w/o assistance    Transfers Overall transfer level: Needs assistance Equipment used: Rolling walker (2 wheels) Transfers: Sit to/from Stand Sit to Stand:  Contact guard assist           General transfer comment: cues for hand placement-pt. unable to stand for duration of standing BP stating it hurts her back too much and had to sit down     Balance                                           ADL either performed or assessed with clinical judgement   ADL Overall ADL's : Needs assistance/impaired                     Lower Body Dressing: Sitting/lateral leans;Moderate assistance Lower Body Dressing Details (indicate cue type and reason): reports she usually bends forward from lower surface of couch and her son also comes by and assists her prn.  she has a reacher she uses.  provided with LH sponge and sock aide. reviewed importance of not bending forward while seated for safety and fall prevention   Toilet Transfer Details (indicate cue type and reason): pt. had just returned from b.room with CNA upon my arrival to room           General ADL Comments: orthostatic BPs taken, pt. remains lower end but reports she is not symptomatic.  tx session focused on LB ADLs and fall prevention strategies.  Recommended con.t use of rollator at home vs. Rw to allow for seated option if needed for rest breaks. She reports it is not the correct height and feels she has to bend  too far forward when she walks with it. Will alert PT to see how they can assist with modifications    Extremity/Trunk Assessment              Vision       Perception     Praxis     Communication Communication Communication: No apparent difficulties   Cognition Arousal: Alert Behavior During Therapy: WFL for tasks assessed/performed Cognition: No apparent impairments                               Following commands: Intact        Cueing   Cueing Techniques: Verbal cues  Exercises      Shoulder Instructions       General Comments      Pertinent Vitals/ Pain       Pain Assessment Pain Assessment: No/denies  pain  Home Living                                          Prior Functioning/Environment              Frequency  Min 2X/week        Progress Toward Goals  OT Goals(current goals can now be found in the care plan section)  Progress towards OT goals: Progressing toward goals     Plan      Co-evaluation                 AM-PAC OT 6 Clicks Daily Activity     Outcome Measure   Help from another person eating meals?: A Little Help from another person taking care of personal grooming?: A Little Help from another person toileting, which includes using toliet, bedpan, or urinal?: A Lot Help from another person bathing (including washing, rinsing, drying)?: A Lot Help from another person to put on and taking off regular upper body clothing?: A Little Help from another person to put on and taking off regular lower body clothing?: A Lot 6 Click Score: 15    End of Session Equipment Utilized During Treatment: Rolling walker (2 wheels)  OT Visit Diagnosis: Unsteadiness on feet (R26.81);Other abnormalities of gait and mobility (R26.89);Muscle weakness (generalized) (M62.81)   Activity Tolerance Patient tolerated treatment well   Patient Left in bed;with call bell/phone within reach   Nurse Communication Other (comment) (rn states ok to work with pt. and requests orthostatic BPs taken)        Time: 8857-8789 OT Time Calculation (min): 28 min  Charges: OT General Charges $OT Visit: 1 Visit OT Treatments $Self Care/Home Management : 23-37 mins  Randall, COTA/L Acute Rehabilitation [phone]   [name]-COTA/L  02/19/2024, 12:35 PM

## 2024-02-19 NOTE — Assessment & Plan Note (Signed)
 Hyponatremia.   Today renal function with serum cr at 1,20 with K at 4.1 and serum bicarbonate at 27  Na 139 Mg 1.7   Will add 2 g Mag sulfate.  Resume loop diuretic Follow up renal function in am.

## 2024-02-20 DIAGNOSIS — I2609 Other pulmonary embolism with acute cor pulmonale: Secondary | ICD-10-CM | POA: Diagnosis not present

## 2024-02-20 DIAGNOSIS — E66813 Obesity, class 3: Secondary | ICD-10-CM

## 2024-02-20 DIAGNOSIS — D509 Iron deficiency anemia, unspecified: Secondary | ICD-10-CM

## 2024-02-20 DIAGNOSIS — N179 Acute kidney failure, unspecified: Secondary | ICD-10-CM | POA: Diagnosis not present

## 2024-02-20 DIAGNOSIS — E785 Hyperlipidemia, unspecified: Secondary | ICD-10-CM | POA: Diagnosis not present

## 2024-02-20 DIAGNOSIS — K219 Gastro-esophageal reflux disease without esophagitis: Secondary | ICD-10-CM

## 2024-02-20 DIAGNOSIS — I10 Essential (primary) hypertension: Secondary | ICD-10-CM | POA: Diagnosis not present

## 2024-02-20 LAB — BASIC METABOLIC PANEL WITH GFR
Anion gap: 9 (ref 5–15)
BUN: 13 mg/dL (ref 8–23)
CO2: 27 mmol/L (ref 22–32)
Calcium: 9.9 mg/dL (ref 8.9–10.3)
Chloride: 103 mmol/L (ref 98–111)
Creatinine, Ser: 1.2 mg/dL — ABNORMAL HIGH (ref 0.44–1.00)
GFR, Estimated: 50 mL/min — ABNORMAL LOW (ref >60)
Glucose, Bld: 92 mg/dL (ref 70–99)
Potassium: 4.1 mmol/L (ref 3.5–5.1)
Sodium: 139 mmol/L (ref 135–145)

## 2024-02-20 LAB — CBC
HCT: 27.3 % — ABNORMAL LOW (ref 36.0–46.0)
Hemoglobin: 8.7 g/dL — ABNORMAL LOW (ref 12.0–15.0)
MCH: 28.2 pg (ref 26.0–34.0)
MCHC: 31.9 g/dL (ref 30.0–36.0)
MCV: 88.6 fL (ref 80.0–100.0)
Platelets: 234 K/uL (ref 150–400)
RBC: 3.08 MIL/uL — ABNORMAL LOW (ref 3.87–5.11)
RDW: 14.7 % (ref 11.5–15.5)
WBC: 6.2 K/uL (ref 4.0–10.5)
nRBC: 0 % (ref 0.0–0.2)

## 2024-02-20 LAB — MAGNESIUM: Magnesium: 1.7 mg/dL (ref 1.7–2.4)

## 2024-02-20 LAB — COOXEMETRY PANEL
Carboxyhemoglobin: 2.8 % — ABNORMAL HIGH (ref 0.5–1.5)
Methemoglobin: <0.7 % (ref 0.0–1.5)
O2 Saturation: 70.8 %
Total hemoglobin: 9.1 g/dL — ABNORMAL LOW (ref 12.0–16.0)

## 2024-02-20 MED ORDER — TORSEMIDE 20 MG PO TABS
20.0000 mg | ORAL_TABLET | Freq: Every day | ORAL | Status: DC
  Administered 2024-02-20 – 2024-02-21 (×2): 20 mg via ORAL
  Filled 2024-02-20 (×2): qty 1

## 2024-02-20 MED ORDER — POLYSACCHARIDE IRON COMPLEX 150 MG PO CAPS
150.0000 mg | ORAL_CAPSULE | Freq: Every day | ORAL | Status: DC
  Administered 2024-02-21 – 2024-02-28 (×8): 150 mg via ORAL
  Filled 2024-02-20 (×8): qty 1

## 2024-02-20 MED ORDER — OXYCODONE HCL 5 MG PO TABS
5.0000 mg | ORAL_TABLET | ORAL | Status: DC | PRN
  Administered 2024-02-22 – 2024-02-27 (×6): 5 mg via ORAL
  Filled 2024-02-20 (×7): qty 1

## 2024-02-20 MED ORDER — MAGNESIUM SULFATE 2 GM/50ML IV SOLN
2.0000 g | Freq: Once | INTRAVENOUS | Status: AC
  Administered 2024-02-20: 2 g via INTRAVENOUS
  Filled 2024-02-20: qty 50

## 2024-02-20 NOTE — TOC Progression Note (Signed)
 Transition of Care [facility]) - Progression Note    Patient Details  Name: Breanna Riley MRN: [id number] Date of Birth: 1960/09/10  Transition of Care [facility]) CM/SW Contact  Graves-Bigelow, Erminio Deems, RN Phone Number: 02/20/2024, 4:00 PM  Clinical Narrative:  Patient transferred from [facility]. Inpatient Case Manager received notification that patient is asking if she is a candidate for CIR. MD to place CIR consult. PT/OT recommendations are for [facility] PT/OT. ICM will continue to follow for disposition needs.    Expected Discharge Plan: IP Rehab Facility Barriers to Discharge: Continued Medical Work up  Expected Discharge Plan and Services   Discharge Planning Services: CM Consult Post Acute Care Choice: IP Rehab Living arrangements for the past 2 months: Single Family Home   Social Drivers of Health (SDOH) Interventions SDOH Screenings   Food Insecurity: No Food Insecurity (02/14/2024)  Housing: Low Risk  (02/14/2024)  Transportation Needs: No Transportation Needs (02/14/2024)  Utilities: Not At Risk (02/14/2024)  Financial Resource Strain: Low Risk  (09/19/2023)   Received from [facility]  Social Connections: Moderately Integrated (02/14/2024)  Stress: No Stress Concern Present (12/12/2023)   Received from [facility]  Tobacco Use: Unknown (02/14/2024)   Readmission Risk Interventions     No data to display

## 2024-02-20 NOTE — Progress Notes (Signed)
 Mobility Specialist Progress Note;   02/20/24 0955  Orthostatic Lying   BP- Lying 109/56  Orthostatic Sitting  BP- Sitting 99/50  Orthostatic Standing at 0 minutes  BP- Standing at 0 minutes 92/74  Mobility  Activity Stood at bedside  Level of Assistance Contact guard assist, steadying assist  Assistive Device Front wheel walker  Activity Response Tolerated fair  Mobility Referral Yes  Mobility visit 1 Mobility  Mobility Specialist Start Time (ACUTE ONLY) [id number]  Mobility Specialist Stop Time (ACUTE ONLY) 1011  Mobility Specialist Time Calculation (min) (ACUTE ONLY) 16 min   Pt agreeable to mobility. Required MinG assistance for all mobility. Took orthostatic vitals on pt (see above). Pt deferred further mobility d/t orthostatics and back pain. When returning back to supine, pt c/o dizziness in this positional change. BP checked in supine 104/60 (74). Pt left in bed with all needs met, eating breakfast. Alarm on.   Lauraine Erm Mobility Specialist Please contact via SecureChat or [employer] [phone]

## 2024-02-20 NOTE — Progress Notes (Signed)
 Progress Note   Patient: Breanna Riley FMW:[id number] DOB: Jun 16, 1960 DOA: 02/14/2024     6 DOS: the patient was seen and examined on 02/20/2024   Brief hospital course: Breanna Riley was admitted to the hospital with massive pulmonary embolism.   63 y/o female with PMH for OSA, Obseity, HLD who presented with syncope and was admitted to Hospitalist service. She has lost 30 lbs with help of Breanna Riley and was started on hydrochlorothiazide 25 mg a few days ago. She started with Lightheadedness 2 days ago and then had 2 episodes of syncope last about 1 min. Lab work on admission showing AKI and increased LA. She was started on IV fluids and she has been given 5 liters and still remains hypotensive. She was started on Levophed.  Chest CT with positive pulmonary embolism with significant RV strain Echo 10/1: EF 50-55% with RV strain and severely reduced RV, RVSP 42 10/01 bilateral pulmonary artery thrombectomy via R CFV  10/1: transfer to ICU, s/p thrombectomy, requiring 6mcg norepinephrine.  10/2 stable but still on levo 10mcg 10/3 weaning levophed as tolerated, + right/left DVT Lower extremities   10/05 transferred to [facility].  10/06 patient recovering well, continue very weak and deconditioned.   Assessment and Plan: * Acute pulmonary embolism with acute cor pulmonale (HCC) Complicated with Acute cor pulmonale  Echocardiogram with preserved LV systolic function with EF 50 to 55%, diastolic dysfunction grade I with impaired relaxation, interventricular septum flattened in systole and diastole, D shaped LV, positive McConnel sing, severe reduction in RV systolic function, RV cavity with severe dilatation, RVSP 42.0 mmHg.  Moderate to severe tricuspid valve regurgitation. RA with mild to moderate dilatation. No pericardial effusion.    Systolic blood pressure 90 mmHg systolic.  SV02 70.8   Plan to continue midodrine for blood pressure support.  Will resume loop diuretic to prevent volume  overload.  Anticoagulation with apixaban.   Bilateral lower extremities deep vein thrombosis.  Right common femoral vein  Left posterior tibial veins.   Essential hypertension Continue blood pressure support with midodrine.  Continue blood pressure monitoring.   AKI (acute kidney injury) Hyponatremia.   Today renal function with serum cr at 1,20 with K at 4.1 and serum bicarbonate at 27  Na 139 Mg 1.7   Will add 2 g Mag sulfate.  Resume loop diuretic Follow up renal function in am.    Dyslipidemia Continue with statin therapy .   GERD (gastroesophageal reflux disease) Continue with pantoprazole   Depression Continue with sertraline   Iron deficiency anemia Iron panel with serum iron of 22, TIBC 279, transferrin saturation 8 and ferritin 297 Sp IV Iron sucrose 200 mg x1   Follow iron panel as outpatient Hgb stable at 8,8   Obesity, class 3 (HCC) Calculated BMI is 56.8      Subjective: Patient with no chest pain or dyspnea, continue weak and deconditioned, has been getting out of the bed to the bathroom   Physical Exam: Vitals:   02/19/24 1629 02/19/24 2157 02/20/24 0513 02/20/24 0815  BP: (!) 93/53  134/71 99/62  Pulse:  92 88   Resp: 18  16 16   Temp: 97.9 F (36.6 C)  (!) 97.5 F (36.4 C) 98 F (36.7 C)  TempSrc: Oral  Oral Oral  SpO2:  91% 100%   Weight:   124.6 kg   Height:       Neurology awake and alert ENT with mild pallor with no icterus Cardiovascular with S1 and S2  present and regular with no gallops, rubs or murmurs  Respiratory with no rales or wheezing, no rhonchi  Abdomen with no distention, soft and non tender No lower extremity edema.   Data Reviewed:    Family Communication: no family at the bedside   Disposition: Status is: Inpatient Remains inpatient appropriate because: recovering pulmonary embolism   Planned Discharge Destination: Rehab     Author: Elidia Toribio Furnace, MD 02/20/2024 9:25 AM  For on call review  [email or web address].

## 2024-02-20 NOTE — Plan of Care (Signed)

## 2024-02-21 DIAGNOSIS — N179 Acute kidney failure, unspecified: Secondary | ICD-10-CM | POA: Diagnosis not present

## 2024-02-21 DIAGNOSIS — F3281 Premenstrual dysphoric disorder: Secondary | ICD-10-CM

## 2024-02-21 DIAGNOSIS — I10 Essential (primary) hypertension: Secondary | ICD-10-CM | POA: Diagnosis not present

## 2024-02-21 DIAGNOSIS — E785 Hyperlipidemia, unspecified: Secondary | ICD-10-CM | POA: Diagnosis not present

## 2024-02-21 DIAGNOSIS — I2609 Other pulmonary embolism with acute cor pulmonale: Secondary | ICD-10-CM | POA: Diagnosis not present

## 2024-02-21 LAB — CBC
HCT: 30.9 % — ABNORMAL LOW (ref 36.0–46.0)
Hemoglobin: 9.8 g/dL — ABNORMAL LOW (ref 12.0–15.0)
MCH: 28 pg (ref 26.0–34.0)
MCHC: 31.7 g/dL (ref 30.0–36.0)
MCV: 88.3 fL (ref 80.0–100.0)
Platelets: 304 K/uL (ref 150–400)
RBC: 3.5 MIL/uL — ABNORMAL LOW (ref 3.87–5.11)
RDW: 15.5 % (ref 11.5–15.5)
WBC: 7.5 K/uL (ref 4.0–10.5)
nRBC: 0 % (ref 0.0–0.2)

## 2024-02-21 LAB — BASIC METABOLIC PANEL WITH GFR
Anion gap: 14 (ref 5–15)
BUN: 15 mg/dL (ref 8–23)
CO2: 28 mmol/L (ref 22–32)
Calcium: 10.1 mg/dL (ref 8.9–10.3)
Chloride: 97 mmol/L — ABNORMAL LOW (ref 98–111)
Creatinine, Ser: 1.46 mg/dL — ABNORMAL HIGH (ref 0.44–1.00)
GFR, Estimated: 40 mL/min — ABNORMAL LOW (ref >60)
Glucose, Bld: 93 mg/dL (ref 70–99)
Potassium: 4.1 mmol/L (ref 3.5–5.1)
Sodium: 139 mmol/L (ref 135–145)

## 2024-02-21 NOTE — Progress Notes (Addendum)
 PICC line removed by IV team per orders from Dr. Arrien due to patient no longer needing central line access. Per Dr. Noralee, ok to leave patient without PIV access at this time due to patient medically stable with no ordered IV medications.

## 2024-02-21 NOTE — Progress Notes (Signed)
   02/21/24 2045  BiPAP/CPAP/SIPAP  BiPAP/CPAP/SIPAP Resmed  Mask Type Nasal pillows  Patient Home Machine Yes  Safety Check Completed by RT for Home Unit Yes, no issues noted  Patient Home Mask Yes  Patient Home Tubing Yes  Device Plugged into RED Power Outlet Yes   Patient using home CPAP unit independently. Told patient to call if she needed anything.

## 2024-02-21 NOTE — Progress Notes (Signed)
 Inpatient Rehab Admissions Coordinator:    I spoke with Pt. Regarding potential CIR admit and did explain that insurance is not likely to approve. She states that she prefers to d/c home with [facility]. Reached out to PT/OT to see if they can increase frequency to prepare her for home d/c.  Leita Kleine, MS, CCC-SLP Rehab Admissions Coordinator  [phone] (celll) [phone] (office)

## 2024-02-21 NOTE — Progress Notes (Signed)
 Occupational Therapy Treatment Patient Details Name: Breanna Riley MRN: [id number] DOB: 1960-12-24 Today's Date: 02/21/2024   History of present illness 63 y.o. female admitted 10/1 with Bilateral Pulmonary Embolism with Cor Pulmonale  - presented with syncope. Bil DVT as well. 10/1 bleeding from femoral access site after sutures removed, required re-suturing and femstop.  PMH: obesity, OSA, HLD, osteoarthritis, and spinal stenosis s/p fusions.   OT comments  Pt progressing toward goals this session, able to perform standing ADLs with set up - min A. Pt educated on use of sock aid for LB ADL and pt able to demo. Still with difficulty performing self pericare after toileting. Pt CGA with use of RW for short distance ambulation in room. Pt presenting with impairments listed below, will follow acutely. Patient will benefit from intensive inpatient follow-up therapy, >3 hours/day to maximize safety/ind with ADL/functional mobility.       If plan is discharge home, recommend the following:  A little help with walking and/or transfers;A lot of help with bathing/dressing/bathroom;Assistance with cooking/housework;Direct supervision/assist for medications management;Direct supervision/assist for financial management;Assist for transportation;Help with stairs or ramp for entrance   Equipment Recommendations  Other (comment) (rollator)    Recommendations for Other Services PT consult    Precautions / Restrictions Precautions Precautions: Fall Recall of Precautions/Restrictions: Intact Precaution/Restrictions Comments: pain with LUE brachial BP reading, try L wrist instead; chair alarm placed during session due to high fall risk, pt educated       Mobility Bed Mobility               General bed mobility comments: received in recliner    Transfers Overall transfer level: Needs assistance Equipment used: Rolling walker (2 wheels) Transfers: Sit to/from Stand Sit to Stand: Contact guard  assist                 Balance Overall balance assessment: Mild deficits observed, not formally tested                                         ADL either performed or assessed with clinical judgement   ADL Overall ADL's : Needs assistance/impaired     Grooming: Oral care;Set up;Standing               Lower Body Dressing: Minimal assistance;Sitting/lateral leans Lower Body Dressing Details (indicate cue type and reason): use of sock aid     Toileting- Clothing Manipulation and Hygiene: Maximal assistance;Sit to/from stand       Functional mobility during ADLs: Contact guard assist;Rolling walker (2 wheels)      Extremity/Trunk Assessment Upper Extremity Assessment Upper Extremity Assessment: Generalized weakness   Lower Extremity Assessment Lower Extremity Assessment: Defer to PT evaluation        Vision   Vision Assessment?: No apparent visual deficits   Perception Perception Perception: Not tested   Praxis Praxis Praxis: Not tested   Communication Communication Communication: No apparent difficulties   Cognition Arousal: Alert Behavior During Therapy: WFL for tasks assessed/performed Cognition: No apparent impairments                               Following commands: Intact        Cueing   Cueing Techniques: Verbal cues  Exercises      Shoulder Instructions       General Comments  VSS    Pertinent Vitals/ Pain       Pain Assessment Pain Assessment: No/denies pain  Home Living                                          Prior Functioning/Environment              Frequency  Min 2X/week        Progress Toward Goals  OT Goals(current goals can now be found in the care plan section)  Progress towards OT goals: Progressing toward goals  Acute Rehab OT Goals Patient Stated Goal: none stated OT Goal Formulation: With patient Time For Goal Achievement: 03/03/24 Potential to  Achieve Goals: Good ADL Goals Pt Will Perform Upper Body Dressing: with supervision;sitting Pt Will Perform Lower Body Dressing: with min assist;sitting/lateral leans;sit to/from stand Pt Will Transfer to Toilet: with supervision;ambulating;regular height toilet Pt Will Perform Tub/Shower Transfer: with min assist;ambulating;Tub transfer;Shower transfer Additional ADL Goal #1: Pt will tolerate OOB standing activity x10 min in order to improve activity tolerance for ADLs  Plan      Co-evaluation                 AM-PAC OT 6 Clicks Daily Activity     Outcome Measure   Help from another person eating meals?: A Little Help from another person taking care of personal grooming?: A Little Help from another person toileting, which includes using toliet, bedpan, or urinal?: A Lot Help from another person bathing (including washing, rinsing, drying)?: A Lot Help from another person to put on and taking off regular upper body clothing?: A Little Help from another person to put on and taking off regular lower body clothing?: A Little 6 Click Score: 16    End of Session Equipment Utilized During Treatment: Rolling walker (2 wheels)  OT Visit Diagnosis: Unsteadiness on feet (R26.81);Other abnormalities of gait and mobility (R26.89);Muscle weakness (generalized) (M62.81)   Activity Tolerance Patient tolerated treatment well   Patient Left with call bell/phone within reach;in chair;with nursing/sitter in room;with chair alarm set   Nurse Communication Mobility status        Time: 1050-1116 OT Time Calculation (min): 26 min  Charges: OT General Charges $OT Visit: 1 Visit OT Treatments $Self Care/Home Management : 23-37 mins  Vickey Boak K, OTD, OTR/L SecureChat Preferred Acute Rehab [phone]   [name] 02/21/2024, 12:33 PM

## 2024-02-21 NOTE — TOC Progression Note (Signed)
 Transition of Care [facility]) - Progression Note    Patient Details  Name: Breanna Riley MRN: [id number] Date of Birth: 1961/01/30  Transition of Care [facility]) CM/SW Contact  Graves-Bigelow, Erminio Deems, RN Phone Number: 02/21/2024, 4:13 PM  Clinical Narrative: Inpatient Case Manager spoke with patient regarding PT/OT recommendations for CIR. Patient asked ICM to see if referral can be submitted to another CIR- ICM discussed [facility] and she is agreeable to be faxed out. ICM discussed disposition plan with MD and he is agreeable. Referral submitted via the Hub, [facility] with Inpatient Rehab will review the clinicals in the am. Patient is aware that if insurance will not approve Inpatient Rehab; then the plan will be for home with home health. ICM will await for insurance approval or denial.      Expected Discharge Plan: IP Rehab Facility Barriers to Discharge: Continued Medical Work up  Expected Discharge Plan and Services   Discharge Planning Services: CM Consult Post Acute Care Choice: IP Rehab Living arrangements for the past 2 months: Single Family Home  Social Drivers of Health (SDOH) Interventions SDOH Screenings   Food Insecurity: No Food Insecurity (02/14/2024)  Housing: Low Risk  (02/14/2024)  Transportation Needs: No Transportation Needs (02/14/2024)  Utilities: Not At Risk (02/14/2024)  Financial Resource Strain: Low Risk  (09/19/2023)   [facility]  Social Connections: Moderately Integrated (02/14/2024)  Stress: No Stress Concern Present (12/12/2023)   Received from [facility]  Tobacco Use: Unknown (02/14/2024)   Readmission Risk Interventions     No data to display

## 2024-02-21 NOTE — Progress Notes (Signed)
 Progress Note   Patient: Breanna Riley FMW:[id number] DOB: [birth year] DOA: 02/14/2024     7 DOS: the patient was seen and examined on 02/21/2024   Brief hospital course: Breanna Riley was admitted to the hospital with massive pulmonary embolism.   63 y/o female with PMH for OSA, Obseity, HLD who presented with syncope. Patient had 3 syncope episodes at home, with no apparent prodrome. EMS was called, and while they at the site she had another other episodes of loss of consciousness, apparently her blood pressure systolic was 72 mmHg. While on route she has another episode of syncope along with blood pressure 50/80, she received 250 ml NS IV and placed on epinephrine drip.  On her initial physical examination she was lethargic but responsive.  Blood pressure 93/64, HR 122, RR 25 and 02 saturation 94%. Lungs with no wheezing or rhonchi, heart with S1 and S2 present and regular, abdomen with no distention, no lower extremity edema.   Na 140, K 2.9 Cl 104 bicarbonate 20, glucose 248 bun 15 cr 1.63  AT 57 ALT 35  High sensitive troponin 52  Lactic acid 4.9  Wbc11.6 hgb 11.0 plt 295   Sars covid 19 negative Influenza negative RSV negative   Urine analysis SG 1,015, protein 100, leukocytes negative, hgb negative   Chest radiograph with hypoinflation, with mild bilateral hilar vascular congestion. CT chest with extensive bilateral pulmonary emboli extending from the distal right and left main pulmonary arteries into lobar and segmental branches with significant right heart strain.  CT head with no acute changes.   EKG 104 bpm, normal axis, normal intervals, qtc 483, sinus rhythm with ST depression and T wave inversion V1 to V3.   She was started on IV fluids and she has been given 5 liters and still remains hypotensive. She was started on Levophed.   Echo 10/1: EF 50-55% with RV strain and severely reduced RV, RVSP 42 10/01 bilateral pulmonary artery thrombectomy via R CFV  10/1: transfer to  ICU, s/p thrombectomy, requiring 6mcg norepinephrine.  10/2 stable but still on levo 10mcg 10/3 weaning levophed as tolerated, + right/left DVT Lower extremities   10/05 transferred to [facility].  10/06 patient recovering well, continue very weak and deconditioned.  10/07 picc line removed, right groin sutures removed.   Assessment and Plan: * Acute pulmonary embolism with acute cor pulmonale (HCC) Complicated with Acute cor pulmonale  Echocardiogram with preserved LV systolic function with EF 50 to 55%, diastolic dysfunction grade I with impaired relaxation, interventricular septum flattened in systole and diastole, D shaped LV, positive McConnel sing, severe reduction in RV systolic function, RV cavity with severe dilatation, RVSP 42.0 mmHg.  Moderate to severe tricuspid valve regurgitation. RA with mild to moderate dilatation. No pericardial effusion.    Systolic blood pressure 100 mmHg systolic.   Plan to continue midodrine for blood pressure support.  Hold on torsemide due to rise in serum cr  Anticoagulation with apixaban.   Bilateral lower extremities deep vein thrombosis.  Right common femoral vein  Left posterior tibial veins.   Essential hypertension Continue blood pressure support with midodrine.  Continue blood pressure monitoring.   AKI (acute kidney injury) Hyponatremia.   Renal function today with serum cr at 1,4 with K at 4,1 and serum bicarbonate at 28  Na 139   Hold on loop diuretic for now. Possible resume with as needed indication  Follow up renal function in am.    Dyslipidemia Continue with statin therapy .  GERD (gastroesophageal reflux disease) Continue with pantoprazole   Depression Continue with sertraline   Iron deficiency anemia Iron panel with serum iron of 22, TIBC 279, transferrin saturation 8 and ferritin 297 Sp IV Iron sucrose 200 mg x1   Follow iron panel as outpatient Hgb stable at 8,8   Obesity, class 3 (HCC) Calculated BMI is 56.8       Subjective: patient with no chest pain and no dyspnea, no lower extremity edema, continue very weak and deconditioned   Physical Exam: Vitals:   02/21/24 0634 02/21/24 0645 02/21/24 0800 02/21/24 1000  BP: (!) 81/47 (!) 85/48 (!) 90/53 (!) 106/51  Pulse: 82 82 82 84  Resp: 17  18   Temp: 97.8 F (36.6 C)  98.5 F (36.9 C)   TempSrc: Oral  Oral   SpO2: 98% 98% 96%   Weight:   123.6 kg   Height:       Neurology awake and alert ENT with mild pallor with no icterus Cardiovascular with S1 and S2 present and regular with no gallops, rubs or murmurs Respiratory with no rales or wheezing, no rhonchi  Abdomen with no distention, soft and non tender No ankle edema.    Right groin after removal of sutures.   Data Reviewed:    Family Communication: I spoke with patient's son over the phone at the bedside, we talked in detail about patient's condition, plan of care and prognosis and all questions were addressed.   Disposition: Status is: Inpatient Remains inpatient appropriate because: pending placement possible  Inpatient rehab referral to [facility]   Planned Discharge Destination: to be determined      Author: Elidia Toribio Furnace, MD 02/21/2024 12:17 PM  For on call review [email or web address].

## 2024-02-21 NOTE — Progress Notes (Addendum)
 Physical Therapy Treatment Patient Details Name: Breanna Riley MRN: [id number] DOB: 1961-04-08 Today's Date: 02/21/2024   History of Present Illness 63 y.o. female admitted 10/1 with Bilateral Pulmonary Embolism with Cor Pulmonale  - presented with syncope. Bil DVT as well. 10/1 bleeding from femoral access site after sutures removed, required re-suturing and femstop.  PMH: obesity, OSA, HLD, osteoarthritis, and spinal stenosis s/p fusions.    PT Comments  Pt received in recliner, pleasantly agreeable to therapy session and with good participation and improving tolerance for transfer and gait training. Worked on [employer] for BLE strengthening and safety with rollator use, pt needs reminders for brake use. Pt needing up to CGA for transfers and gait with safety cues and remains a high fall risk due to mild orthostatic symptoms with standing >5 mins, BP MAP stable but reading taken on a different area for each BP due to pain in upper arm after first reading so may be inaccurate due to this. Pt gait speed <0.4 m/s and symptoms increase her risk of falls. Pt agreeable to sit in recliner to await OT session at end of session, chair alarm placed for pt safety due to her classification under high fall risk. Pt reports typically she can stand and ambulate in home for >30 mins at a time prior to needing seated rest break and can stand and walk in store to perform her shopping with support of shopping cart, etc. Patient will benefit from intensive inpatient follow-up therapy, >3 hours/day as pt mobilizing below prior level of function, discussed with pt and supervising PT Dawn M based on pt progress, pt reports she has good support at home.     If plan is discharge home, recommend the following: Assistance with cooking/housework;Assist for transportation;Help with stairs or ramp for entrance;A little help with walking and/or transfers;A little help with bathing/dressing/bathroom   Can travel by private  vehicle        Equipment Recommendations  Rollator (4 wheels) (bariatric width (not youth height))    Recommendations for Other Services       Precautions / Restrictions Precautions Precautions: Fall Recall of Precautions/Restrictions: Intact Precaution/Restrictions Comments: pain with LUE brachial BP reading, try L wrist instead; chair alarm placed during session due to high fall risk, pt educated Restrictions Weight Bearing Restrictions Per Provider Order: No     Mobility  Bed Mobility               General bed mobility comments: received in recliner    Transfers Overall transfer level: Needs assistance Equipment used: Rollator (4 wheels) (bari rollator) Transfers: Sit to/from Stand Sit to Stand: Contact guard assist           General transfer comment: recliner<>rollator x2, also to/from rollator seat, needs reminders >50% of the time for use of brakes prior to sitting and then prior to ambulation    Ambulation/Gait Ambulation/Gait assistance: Contact guard assist Gait Distance (Feet): 75 Feet (27ft, seated break, 32ft) Assistive device: Rollator (4 wheels) (bari width) Gait Pattern/deviations: Step-through pattern, Decreased stride length, Wide base of support Gait velocity: decreased, grossly <0.4 m/s Gait velocity interpretation: <1.8 ft/sec, indicate of risk for recurrent falls   General Gait Details: Cues for rollator mgmt, upright posture due to initial trunk flexion with good carryover, and activity pacing. Pt c/o some orthostatic symptoms after ~110ft so pt took seated break on rollator, then able to continue 2nd trial to return to room and recliner after seated break ~3 mins, no buckling or LOB.  SpO2 93% on RA with exertion, HR ~100-110 bpm   Stairs             Wheelchair Mobility     Tilt Bed    Modified Rankin (Stroke Patients Only)       Balance Overall balance assessment: Mild deficits observed, not formally tested   Sitting  balance-Leahy Scale: Good     Standing balance support: Single extremity supported, Bilateral upper extremity supported, Reliant on assistive device for balance Standing balance-Leahy Scale: Poor Standing balance comment: static standing CGA and single UE support, needs BUE support for dynamic tasks                            Communication Communication Communication: No apparent difficulties  Cognition Arousal: Alert Behavior During Therapy: WFL for tasks assessed/performed   PT - Cognitive impairments: No apparent impairments                       PT - Cognition Comments: Needs reminders for use of rollator brakes Following commands: Intact      Cueing Cueing Techniques: Verbal cues  Exercises General Exercises - Lower Extremity Hip Flexion/Marching: AROM, Both, 10 reps, Standing (at rollator) Heel Raises: AROM, Both, Standing (x2 reps, defer more due to pain in RLE)    General Comments General comments (skin integrity, edema, etc.): some bruising observed on LUE brachial area (from BP cuff likely), PTA brought smaller cuff to use for her L wrist and pt reports less pain with this reading; SpO2 93% and above on RA.      Pertinent Vitals/Pain Pain Assessment Pain Assessment: Faces Faces Pain Scale: Hurts little more Pain Location: L upper arm with BP, PTA brought smaller cuff for her L wrist during session Pain Descriptors / Indicators: Discomfort, Grimacing, Guarding Pain Intervention(s): Limited activity within patient's tolerance, Monitored during session, Repositioned    Home Living                          Prior Function            PT Goals (current goals can now be found in the care plan section) Acute Rehab PT Goals Patient Stated Goal: to go home PT Goal Formulation: With patient Time For Goal Achievement: 03/02/24 Progress towards PT goals: Progressing toward goals    Frequency    Min 2X/week      PT Plan       Co-evaluation              AM-PAC PT 6 Clicks Mobility   Outcome Measure  Help needed turning from your back to your side while in a flat bed without using bedrails?: A Little Help needed moving from lying on your back to sitting on the side of a flat bed without using bedrails?: A Little Help needed moving to and from a bed to a chair (including a wheelchair)?: A Little Help needed standing up from a chair using your arms (e.g., wheelchair or bedside chair)?: A Little Help needed to walk in hospital room?: A Little Help needed climbing 3-5 steps with a railing? : Total 6 Click Score: 16    End of Session Equipment Utilized During Treatment: Gait belt Activity Tolerance: Patient tolerated treatment well Patient left: in chair;with call bell/phone within reach;with chair alarm set Nurse Communication: Mobility status;Other (comment) (BP entered into flowsheet) PT Visit Diagnosis: Muscle weakness (  generalized) (M62.81)     Time: 9042-8972 PT Time Calculation (min) (ACUTE ONLY): 30 min  Charges:    $Gait Training: 8-22 mins $Therapeutic Activity: 8-22 mins PT General Charges $$ ACUTE PT VISIT: 1 Visit                     Lachlyn Vanderstelt P., PTA Acute Rehabilitation Services Secure Chat Preferred 9a-5:30pm Office: [phone]    Connell HERO [facility] 02/21/2024, 10:45 AM

## 2024-02-22 DIAGNOSIS — I2609 Other pulmonary embolism with acute cor pulmonale: Secondary | ICD-10-CM | POA: Diagnosis not present

## 2024-02-22 LAB — BASIC METABOLIC PANEL WITH GFR
Anion gap: 12 (ref 5–15)
BUN: 18 mg/dL (ref 8–23)
CO2: 29 mmol/L (ref 22–32)
Calcium: 9.8 mg/dL (ref 8.9–10.3)
Chloride: 98 mmol/L (ref 98–111)
Creatinine, Ser: 1.36 mg/dL — ABNORMAL HIGH (ref 0.44–1.00)
GFR, Estimated: 43 mL/min — ABNORMAL LOW (ref >60)
Glucose, Bld: 112 mg/dL — ABNORMAL HIGH (ref 70–99)
Potassium: 4.5 mmol/L (ref 3.5–5.1)
Sodium: 139 mmol/L (ref 135–145)

## 2024-02-22 LAB — CBC
HCT: 27.9 % — ABNORMAL LOW (ref 36.0–46.0)
Hemoglobin: 9 g/dL — ABNORMAL LOW (ref 12.0–15.0)
MCH: 28.5 pg (ref 26.0–34.0)
MCHC: 32.3 g/dL (ref 30.0–36.0)
MCV: 88.3 fL (ref 80.0–100.0)
Platelets: 148 K/uL — ABNORMAL LOW (ref 150–400)
RBC: 3.16 MIL/uL — ABNORMAL LOW (ref 3.87–5.11)
RDW: 15.7 % — ABNORMAL HIGH (ref 11.5–15.5)
WBC: 6.4 K/uL (ref 4.0–10.5)
nRBC: 0 % (ref 0.0–0.2)

## 2024-02-22 LAB — MAGNESIUM: Magnesium: 1.8 mg/dL (ref 1.7–2.4)

## 2024-02-22 NOTE — Plan of Care (Signed)

## 2024-02-22 NOTE — Progress Notes (Signed)
 Physical Therapy Treatment Patient Details Name: Breanna Riley MRN: [id number] DOB: 11-27-1960 Today's Date: 02/22/2024   History of Present Illness 63 y.o. female admitted 10/1 with Bilateral Pulmonary Embolism with Cor Pulmonale  - presented with syncope. Bil DVT as well. 10/1 bleeding from femoral access site after sutures removed, required re-suturing and femstop.  PMH: obesity, OSA, HLD, osteoarthritis, and spinal stenosis s/p fusions.    PT Comments  The pt was agreeable to session with focus on progressing activity. The pt presents with intermittently soft BP through session (see values below), but was able to greatly progress total ambulation distance at this time. The pt completed 3 bouts of ambulation with distances ranging from 75-150 ft with need for 3-5 min seated rest between each bout. Pt also able to demo improved tolerance for sit-stand transfers, completing without UE support for LE strengthening. Pt remains at high risk for falls with mobility given relatively limited symptoms for self-monitoring for drop in BP, even after medication to improve BP. Will continue to work on progressing endurance and stability with gait as well as improved self-monitoring. Recommendations remain appropriate.   VITALS:  - standing after ADLs - BP: 103/62 (76); HR: 108bpm - standing after ambulation - BP: 99/86 (92); HR: 110bpm - sitting in recliner after sit-stands - BP: 85/57 (66); HR: 92bpm *pt felt ringing in her ears after sit-stands* - re-cycle BP still sitting - BP: 97/48 (63); HR: 89bpm - sitting in chair after 2nd set of sit-stands - 101/68 (77); HR: 88bpm  HR max: 124bpm during gait SpO2: 92-97% on RA   If plan is discharge home, recommend the following: Assistance with cooking/housework;Assist for transportation;Help with stairs or ramp for entrance;A little help with walking and/or transfers;A little help with bathing/dressing/bathroom   Can travel by private vehicle        Equipment  Recommendations  Rollator (4 wheels) (bariatric width (not youth height))    Recommendations for Other Services       Precautions / Restrictions Precautions Precautions: Fall Recall of Precautions/Restrictions: Intact Precaution/Restrictions Comments: soft BP Restrictions Weight Bearing Restrictions Per Provider Order: No     Mobility  Bed Mobility               General bed mobility comments: standing with OT upon arrival left in recliner    Transfers Overall transfer level: Needs assistance Equipment used: Rollator (4 wheels) (bari rollator) Transfers: Sit to/from Stand Sit to Stand: Supervision           General transfer comment: able to stand without UE support, but with soft BP after 5x sit-stand 97/48 (63) cues for use of rollator breaks    Ambulation/Gait Ambulation/Gait assistance: Contact guard assist Gait Distance (Feet): 150 Feet (+ 75 ft + 75 ft) Assistive device: Rollator (4 wheels) (bari width) Gait Pattern/deviations: Step-through pattern, Decreased stride length, Wide base of support Gait velocity: decreased, grossly <0.4 m/s Gait velocity interpretation: <1.31 ft/sec, indicative of household ambulator   General Gait Details: cues for posture, rollator management, and safety features. Pt able to self-monitor for need for seated rest, and needed only min cues for use of breaks. Pt reports mild dizziness and asked for seated rest, BP stable upon return to room      Balance Overall balance assessment: Mild deficits observed, not formally tested   Sitting balance-Leahy Scale: Good     Standing balance support: Single extremity supported, Bilateral upper extremity supported, Reliant on assistive device for balance Standing balance-Leahy Scale: Poor Standing balance  comment: static standing CGA and single UE support, needs BUE support for dynamic tasks                            Communication Communication Communication: No apparent  difficulties  Cognition Arousal: Alert Behavior During Therapy: WFL for tasks assessed/performed   PT - Cognitive impairments: No apparent impairments                       PT - Cognition Comments: Needs reminders for use of rollator brakes Following commands: Intact      Cueing Cueing Techniques: Verbal cues  Exercises Other Exercises Other Exercises: 5x sit-stand x2 (31 sec initially, then 13 sec)    General Comments General comments (skin integrity, edema, etc.): BP intermittently soft through session      Pertinent Vitals/Pain Pain Assessment Pain Assessment: No/denies pain Pain Intervention(s): Monitored during session     PT Goals (current goals can now be found in the care plan section) Acute Rehab PT Goals Patient Stated Goal: to go home PT Goal Formulation: With patient Time For Goal Achievement: 03/02/24 Potential to Achieve Goals: Good Progress towards PT goals: Progressing toward goals    Frequency    Min 2X/week       AM-PAC PT 6 Clicks Mobility   Outcome Measure  Help needed turning from your back to your side while in a flat bed without using bedrails?: A Little Help needed moving from lying on your back to sitting on the side of a flat bed without using bedrails?: A Little Help needed moving to and from a bed to a chair (including a wheelchair)?: A Little Help needed standing up from a chair using your arms (e.g., wheelchair or bedside chair)?: A Little Help needed to walk in hospital room?: A Little Help needed climbing 3-5 steps with a railing? : Total 6 Click Score: 16    End of Session Equipment Utilized During Treatment: Gait belt Activity Tolerance: Patient tolerated treatment well Patient left: in chair;with call bell/phone within reach;with chair alarm set Nurse Communication: Mobility status PT Visit Diagnosis: Muscle weakness (generalized) (M62.81)     Time: 9168-9085 PT Time Calculation (min) (ACUTE ONLY): 43  min  Charges:    $Gait Training: 8-22 mins $Therapeutic Exercise: 23-37 mins PT General Charges $$ ACUTE PT VISIT: 1 Visit                     Izetta Call, PT, DPT   Acute Rehabilitation Department Office [phone] Secure Chat Communication Preferred   Izetta JULIANNA Call 02/22/2024, 10:17 AM

## 2024-02-22 NOTE — Progress Notes (Signed)
 PROGRESS NOTE    Breanna Riley  FMW:[id number] DOB: [birth year] DOA: 02/14/2024 PCP: Seabron Lenis, MD   63 y/o female with PMH for OSA, Obseity, HLD who presented with syncope. Patient had 3 syncope episodes at home, when EMS arrived systolic blood pressure was 72, given fluid bolus placed on epinephrine .labs cr 1.63 , troponin 52 , Lactic acid 4.9  -Chest radiograph with hypoinflation, with mild bilateral hilar vascular congestion. CT chest with extensive bilateral pulmonary emboli extending from the distal right and left main pulmonary arteries into lobar and segmental branches with significant right heart strain.  CT head with no acute changes.   -Admitted to the ICU, given 5 L of IV fluid followed by norepinephrine, IV heparin -Echo 10/1: EF 50-55% with RV strain and severely reduced RV, RVSP 42 10/01 bilateral pulmonary artery thrombectomy via R CFV  -10/1: transfer to ICU, s/p thrombectomy, requiring 6mcg norepinephrine.  -10/2 stable but still on levo 10mcg -10/3 weaning levophed as tolerated, + right/left DVT Lower extremities   10/05 transferred to [facility].  10/06 patient recovering well, continue very weak and deconditioned.  10/07 picc line removed, right groin sutures removed.    Subjective: -Still feels weak, short of breath with minimal activity  Assessment and Plan:  Acute pulmonary embolism with acute cor pulmonale  -Echo with EF 50 to 55%, grade 1 DD, severely reduced RV severe dilation of RV cavity, Moderate to severe tricuspid valve regurgitation.  - Overall improving,  - Continue apixaban, would likely need this long-term  -Continue midodrine - Potential risk factors morbid obesity, relative immobility and semaglutide  - Increase activity -TOC following, for now on CIR  Bilateral lower extremities deep vein thrombosis.  Right common femoral vein  Left posterior tibial veins.  - Continue apixaban as above  Essential hypertension -Soft BP secondary to massive PE,  continue midodrine  AKI (acute kidney injury) Hyponatremia.  - Stable  Dyslipidemia Continue with statin therapy .   GERD (gastroesophageal reflux disease) Continue with pantoprazole   Depression Continue with sertraline   Iron deficiency anemia Iron panel with serum iron of 22, TIBC 279, transferrin saturation 8 and ferritin 297 Sp IV Iron sucrose 200 mg x1   Follow iron panel as outpatient Hgb stable at 8,8   Obesity, class 3 (HCC) Calculated BMI is 56.8   DVT prophylaxis: Apixaban Code Status: Full code Family Communication: None present Disposition Plan: CIR versus tomorrow if stable  Consultants:    Procedures:   Antimicrobials:    Objective: Vitals:   02/22/24 0200 02/22/24 0349 02/22/24 0905 02/22/24 1030  BP:  103/62 101/68 (!) 101/53  Pulse: 82 79 86 90  Resp:  18 20 20   Temp:  98.5 F (36.9 C) (!) 97.4 F (36.3 C)   TempSrc:  Oral Oral   SpO2: 93% 99% 93% 98%  Weight:  123 kg    Height:        Intake/Output Summary (Last 24 hours) at 02/22/2024 1149 Last data filed at 02/22/2024 0100 Gross per 24 hour  Intake 360 ml  Output 850 ml  Net -490 ml   Filed Weights   02/20/24 0513 02/21/24 0800 02/22/24 0349  Weight: 124.6 kg 123.6 kg 123 kg    Examination:  General exam: Appears calm and comfortable obese, chronically ill Respiratory system: Clear to auscultation Cardiovascular system: S1 & S2 heard, RRR.  Abd: nondistended, soft and nontender.Normal bowel sounds heard. Central nervous system: Alert and oriented. No focal neurological deficits. Extremities: no edema  Skin: No rashes Psychiatry:  Mood & affect appropriate.     Data Reviewed:   CBC: Recent Labs  Lab 02/18/24 0420 02/19/24 0600 02/20/24 0500 02/21/24 1120 02/22/24 0443  WBC 5.4 5.5 6.2 7.5 6.4  HGB 7.9* 8.8* 8.7* 9.8* 9.0*  HCT 24.9* 27.6* 27.3* 30.9* 27.9*  MCV 88.6 87.6 88.6 88.3 88.3  PLT 221 241 234 304 148*   Basic Metabolic Panel: Recent Labs  Lab  02/17/24 0325 02/18/24 0420 02/19/24 0600 02/20/24 0500 02/21/24 1120 02/22/24 0817  NA 134* 136 138 139 139 139  K 3.6 4.0 4.2 4.1 4.1 4.5  CL 101 98 101 103 97* 98  CO2 27 28 29 27 28 29   GLUCOSE 100* 91 92 92 93 112*  BUN 15 14 14 13 15 18   CREATININE 1.36* 1.13* 1.22* 1.20* 1.46* 1.36*  CALCIUM 8.3* 8.8* 9.5 9.9 10.1 9.8  MG 2.5* 2.3 1.9 1.7  --  1.8   GFR: Estimated Creatinine Clearance: 48.9 mL/min (A) (by C-G formula based on SCr of 1.36 mg/dL (H)). Liver Function Tests: No results for input(s): AST, ALT, ALKPHOS, BILITOT, PROT, ALBUMIN in the last 168 hours. No results for input(s): LIPASE, AMYLASE in the last 168 hours. No results for input(s): AMMONIA in the last 168 hours. Coagulation Profile: No results for input(s): INR, PROTIME in the last 168 hours. Cardiac Enzymes: No results for input(s): CKTOTAL, CKMB, CKMBINDEX, TROPONINI in the last 168 hours. BNP (last 3 results) No results for input(s): PROBNP in the last 8760 hours. HbA1C: No results for input(s): HGBA1C in the last 72 hours. CBG: Recent Labs  Lab 02/18/24 1130  GLUCAP 81   Lipid Profile: No results for input(s): CHOL, HDL, LDLCALC, TRIG, CHOLHDL, LDLDIRECT in the last 72 hours. Thyroid  Function Tests: No results for input(s): TSH, T4TOTAL, FREET4, T3FREE, THYROIDAB in the last 72 hours. Anemia Panel: No results for input(s): VITAMINB12, FOLATE, FERRITIN, TIBC, IRON, RETICCTPCT in the last 72 hours. Urine analysis:    Component Value Date/Time   COLORURINE YELLOW 02/14/2024 1421   APPEARANCEUR CLEAR 02/14/2024 1421   LABSPEC 1.015 02/14/2024 1421   PHURINE 6.0 02/14/2024 1421   GLUCOSEU NEGATIVE 02/14/2024 1421   HGBUR NEGATIVE 02/14/2024 1421   BILIRUBINUR NEGATIVE 02/14/2024 1421   KETONESUR NEGATIVE 02/14/2024 1421   PROTEINUR 100 (A) 02/14/2024 1421   NITRITE NEGATIVE 02/14/2024 1421   LEUKOCYTESUR NEGATIVE 02/14/2024  1421   Sepsis Labs: @LABRCNTIP (procalcitonin:4,lacticidven:4)  ) Recent Results (from the past 240 hours)  Culture, blood (routine x 2)     Status: None   Collection Time: 02/14/24 11:32 AM   Specimen: BLOOD  Result Value Ref Range Status   Specimen Description BLOOD SITE NOT SPECIFIED  Final   Special Requests   Final    BOTTLES DRAWN AEROBIC AND ANAEROBIC Blood Culture adequate volume   Culture   Final    NO GROWTH 5 DAYS Performed at [facility], [address] [place], [place] [address]    Report Status 02/19/2024 FINAL  Final  Resp panel by RT-PCR (RSV, Flu A&B, Covid) Anterior Nasal Swab     Status: None   Collection Time: 02/14/24 11:32 AM   Specimen: Anterior Nasal Swab  Result Value Ref Range Status   SARS Coronavirus 2 by RT PCR NEGATIVE NEGATIVE Final   Influenza A by PCR NEGATIVE NEGATIVE Final   Influenza B by PCR NEGATIVE NEGATIVE Final    Comment: (NOTE) The Xpert Xpress SARS-CoV-2/FLU/RSV plus assay is intended as  an aid in the diagnosis of influenza from Nasopharyngeal swab specimens and should not be used as a sole basis for treatment. Nasal washings and aspirates are unacceptable for Xpert Xpress SARS-CoV-2/FLU/RSV testing.  Fact Sheet for Patients: BloggerCourse.com  Fact Sheet for Healthcare Providers: SeriousBroker.it  This test is not yet approved or cleared by the [place]  FDA and has been authorized for detection and/or diagnosis of SARS-CoV-2 by FDA under an Emergency Use Authorization (EUA). This EUA will remain in effect (meaning this test can be used) for the duration of the COVID-19 declaration under Section 564(b)(1) of the Act, 21 U.S.C. section 360bbb-3(b)(1), unless the authorization is terminated or revoked.     Resp Syncytial Virus by PCR NEGATIVE NEGATIVE Final    Comment: (NOTE) Fact Sheet for Patients: BloggerCourse.com  Fact Sheet for  Healthcare Providers: SeriousBroker.it  This test is not yet approved or cleared by the [place]  FDA and has been authorized for detection and/or diagnosis of SARS-CoV-2 by FDA under an Emergency Use Authorization (EUA). This EUA will remain in effect (meaning this test can be used) for the duration of the COVID-19 declaration under Section 564(b)(1) of the Act, 21 U.S.C. section 360bbb-3(b)(1), unless the authorization is terminated or revoked.  Performed at [facility], [address] [place], [place] [address]   Culture, blood (routine x 2)     Status: None   Collection Time: 02/14/24 12:43 PM   Specimen: BLOOD RIGHT HAND  Result Value Ref Range Status   Specimen Description BLOOD RIGHT HAND  Final   Special Requests   Final    BOTTLES DRAWN AEROBIC ONLY Blood Culture results may not be optimal due to an inadequate volume of blood received in culture bottles   Culture   Final    NO GROWTH 5 DAYS Performed at [facility], [address] [place], [place] [address]    Report Status 02/19/2024 FINAL  Final  MRSA Next Gen by PCR, Nasal     Status: None   Collection Time: 02/15/24 12:41 AM   Specimen: Nasal Mucosa; Nasal Swab  Result Value Ref Range Status   MRSA by PCR Next Gen NOT DETECTED NOT DETECTED Final    Comment: (NOTE) The GeneXpert MRSA Assay (FDA approved for NASAL specimens only), is one component of a comprehensive MRSA colonization surveillance program. It is not intended to diagnose MRSA infection nor to guide or monitor treatment for MRSA infections. Test performance is not FDA approved in patients less than 12 years old. Performed at [facility], [address], [place], [place] [address]      Radiology Studies: No results found.   Scheduled Meds:  apixaban  10 mg Oral BID   Followed by   NOREEN ON 02/24/2024] apixaban  5 mg Oral BID   atorvastatin  20 mg Oral QHS   cholecalciferol  1,000 Units Oral  BID   cyclobenzaprine  10 mg Oral QHS   iron polysaccharides  150 mg Oral Daily   midodrine  2.5 mg Oral BID WC   pantoprazole  40 mg Oral QAC breakfast   polyethylene glycol  17 g Oral BID   pregabalin  200 mg Oral TID   senna  1 tablet Oral BID   sertraline  50 mg Oral Daily   sodium chloride flush  10-40 mL Intracatheter Q12H   Continuous Infusions:   LOS: 8 days    Time spent:    Sigurd Pac, MD [facility]   02/22/2024, 11:49 AM

## 2024-02-22 NOTE — Progress Notes (Signed)
   02/22/24 2128  BiPAP/CPAP/SIPAP  BiPAP/CPAP/SIPAP Pt Type Adult  BiPAP/CPAP/SIPAP Resmed  Reason BIPAP/CPAP not in use  (Sonat bedside, will assist when she goes to bed.)  Mask Type Nasal pillows  Dentures removed? Not applicable  FiO2 (%) 28 %  Flow Rate 2 lpm  Patient Home Machine Yes  Safety Check Completed by RT for Home Unit Yes, no issues noted  Patient Home Mask Yes  Patient Home Tubing Yes  Auto Titrate Yes  Minimum cmH2O 5 cmH2O  Maximum cmH2O 14 cmH2O  Nasal massage performed Yes  CPAP/SIPAP surface wiped down Yes  Device Plugged into RED Power Outlet Yes

## 2024-02-22 NOTE — Progress Notes (Signed)
 Occupational Therapy Treatment Patient Details Name: Breanna Riley MRN: [id number] DOB: 03-Feb-1961 Today's Date: 02/22/2024   History of present illness 63 y.o. female admitted 10/1 with Bilateral Pulmonary Embolism with Cor Pulmonale  - presented with syncope. Bil DVT as well. 10/1 bleeding from femoral access site after sutures removed, required re-suturing and femstop.  PMH: obesity, OSA, HLD, osteoarthritis, and spinal stenosis s/p fusions.   OT comments  Pt progressing toward goals this session, pt initially dizzy with long sitting in bed with BP soft, 88/68 (76) improved to 103/62 (71) with standing/ambulation in room, pt asymptomatic. Pt able to complete ambulation in room to toilet with supervision and use of RW, CGA for standing grooming tasks at sink. Pt needs max A for pericare in standing, typically reports using toileting aid at home. Pt presenting with impairments listed below, will follow acutely. Patient will benefit from intensive inpatient follow-up therapy, >3 hours/day to maximize safety/ind with ADL/functional mobility.       If plan is discharge home, recommend the following:  A little help with walking and/or transfers;A lot of help with bathing/dressing/bathroom;Assistance with cooking/housework;Direct supervision/assist for medications management;Direct supervision/assist for financial management;Assist for transportation;Help with stairs or ramp for entrance   Equipment Recommendations  Other (comment) (rollator)    Recommendations for Other Services PT consult    Precautions / Restrictions Precautions Precautions: Fall Recall of Precautions/Restrictions: Intact Precaution/Restrictions Comments: pain with LUE brachial BP reading, try L wrist instead; chair alarm placed during session due to high fall risk, pt educated Restrictions Weight Bearing Restrictions Per Provider Order: No       Mobility Bed Mobility Overal bed mobility: Modified Independent                   Transfers Overall transfer level: Needs assistance Equipment used: Rolling walker (2 wheels) Transfers: Sit to/from Stand Sit to Stand: Supervision                 Balance   Sitting-balance support: No upper extremity supported, Feet supported Sitting balance-Leahy Scale: Good     Standing balance support: Bilateral upper extremity supported, Reliant on assistive device for balance, During functional activity Standing balance-Leahy Scale: Poor                             ADL either performed or assessed with clinical judgement   ADL Overall ADL's : Needs assistance/impaired     Grooming: Oral care;Wash/dry hands;Supervision/safety;Standing           Upper Body Dressing : Contact guard assist;Standing       Toilet Transfer: Contact guard assist;Rolling walker (2 wheels)   Toileting- Clothing Manipulation and Hygiene: Maximal assistance Toileting - Clothing Manipulation Details (indicate cue type and reason): pericare standing            Extremity/Trunk Assessment Upper Extremity Assessment Upper Extremity Assessment: Generalized weakness   Lower Extremity Assessment Lower Extremity Assessment: Defer to PT evaluation        Vision   Vision Assessment?: No apparent visual deficits   Perception Perception Perception: Not tested   Praxis Praxis Praxis: Not tested   Communication Communication Communication: No apparent difficulties   Cognition Arousal: Alert Behavior During Therapy: WFL for tasks assessed/performed Cognition: No apparent impairments                               Following commands: Intact  Cueing   Cueing Techniques: Verbal cues  Exercises      Shoulder Instructions       General Comments VSS    Pertinent Vitals/ Pain       Pain Assessment Pain Assessment: No/denies pain  Home Living                                          Prior  Functioning/Environment              Frequency  Min 2X/week        Progress Toward Goals  OT Goals(current goals can now be found in the care plan section)  Progress towards OT goals: Progressing toward goals  Acute Rehab OT Goals Patient Stated Goal: none stated OT Goal Formulation: With patient Time For Goal Achievement: 03/03/24 Potential to Achieve Goals: Good ADL Goals Pt Will Perform Upper Body Dressing: with supervision;sitting Pt Will Perform Lower Body Dressing: with min assist;sitting/lateral leans;sit to/from stand Pt Will Transfer to Toilet: with supervision;ambulating;regular height toilet Pt Will Perform Tub/Shower Transfer: with min assist;ambulating;Tub transfer;Shower transfer Additional ADL Goal #1: Pt will tolerate OOB standing activity x10 min in order to improve activity tolerance for ADLs  Plan      Co-evaluation                 AM-PAC OT 6 Clicks Daily Activity     Outcome Measure   Help from another person eating meals?: None Help from another person taking care of personal grooming?: A Little Help from another person toileting, which includes using toliet, bedpan, or urinal?: A Lot Help from another person bathing (including washing, rinsing, drying)?: A Lot Help from another person to put on and taking off regular upper body clothing?: A Little Help from another person to put on and taking off regular lower body clothing?: A Little 6 Click Score: 17    End of Session Equipment Utilized During Treatment: Rolling walker (2 wheels)  OT Visit Diagnosis: Unsteadiness on feet (R26.81);Other abnormalities of gait and mobility (R26.89);Muscle weakness (generalized) (M62.81)   Activity Tolerance Patient tolerated treatment well   Patient Left with call bell/phone within reach;in chair;with nursing/sitter in room;with chair alarm set   Nurse Communication Mobility status        Time: 9191-9164 OT Time Calculation (min): 27  min  Charges: OT General Charges $OT Visit: 1 Visit OT Treatments $Self Care/Home Management : 23-37 mins  Breanna Riley, OTD, OTR/L SecureChat Preferred Acute Rehab [phone]    Breanna Riley 02/22/2024, 8:42 AM

## 2024-02-23 ENCOUNTER — Inpatient Hospital Stay (HOSPITAL_COMMUNITY)

## 2024-02-23 DIAGNOSIS — I2609 Other pulmonary embolism with acute cor pulmonale: Secondary | ICD-10-CM | POA: Diagnosis not present

## 2024-02-23 LAB — ECHOCARDIOGRAM LIMITED
Calc EF: 68.8 %
Height: 59 in
S' Lateral: 2.8 cm
Single Plane A2C EF: 77 %
Single Plane A4C EF: 56 %
Weight: 4372.8 [oz_av]

## 2024-02-23 LAB — CBC
HCT: 27.9 % — ABNORMAL LOW (ref 36.0–46.0)
Hemoglobin: 8.8 g/dL — ABNORMAL LOW (ref 12.0–15.0)
MCH: 28 pg (ref 26.0–34.0)
MCHC: 31.5 g/dL (ref 30.0–36.0)
MCV: 88.9 fL (ref 80.0–100.0)
Platelets: 234 K/uL (ref 150–400)
RBC: 3.14 MIL/uL — ABNORMAL LOW (ref 3.87–5.11)
RDW: 15.7 % — ABNORMAL HIGH (ref 11.5–15.5)
WBC: 6.5 K/uL (ref 4.0–10.5)
nRBC: 0 % (ref 0.0–0.2)

## 2024-02-23 LAB — BASIC METABOLIC PANEL WITH GFR
Anion gap: 9 (ref 5–15)
BUN: 17 mg/dL (ref 8–23)
CO2: 28 mmol/L (ref 22–32)
Calcium: 9.5 mg/dL (ref 8.9–10.3)
Chloride: 101 mmol/L (ref 98–111)
Creatinine, Ser: 1.39 mg/dL — ABNORMAL HIGH (ref 0.44–1.00)
GFR, Estimated: 42 mL/min — ABNORMAL LOW (ref >60)
Glucose, Bld: 98 mg/dL (ref 70–99)
Potassium: 3.9 mmol/L (ref 3.5–5.1)
Sodium: 138 mmol/L (ref 135–145)

## 2024-02-23 MED ORDER — MIDODRINE HCL 5 MG PO TABS
5.0000 mg | ORAL_TABLET | Freq: Two times a day (BID) | ORAL | Status: DC
  Administered 2024-02-23 – 2024-02-28 (×11): 5 mg via ORAL
  Filled 2024-02-23 (×11): qty 1

## 2024-02-23 MED ORDER — PERFLUTREN LIPID MICROSPHERE
1.0000 mL | INTRAVENOUS | Status: AC | PRN
  Administered 2024-02-23: 2 mL via INTRAVENOUS

## 2024-02-23 NOTE — Plan of Care (Signed)

## 2024-02-23 NOTE — Progress Notes (Signed)
 Mobility Specialist Progress Note;   02/23/24 0915  Orthostatic Lying   BP- Lying (!) 84/60  Orthostatic Sitting  BP- Sitting 91/70  Orthostatic Standing at 0 minutes  BP- Standing at 0 minutes (!) 88/67  Orthostatic Standing at 3 minutes  BP- Standing at 3 minutes 91/58  Mobility  Activity Ambulated with assistance;Pivoted/transferred from bed to chair (in room)  Level of Assistance Standby assist, set-up cues, supervision of patient - no hands on  Assistive Device Front wheel walker  Distance Ambulated (ft) 25 ft  Activity Response Tolerated fair  Mobility Referral Yes  Mobility visit 1 Mobility  Mobility Specialist Start Time (ACUTE ONLY) 0915  Mobility Specialist Stop Time (ACUTE ONLY) [id number]  Mobility Specialist Time Calculation (min) (ACUTE ONLY) 27 min   Pt agreeable to mobility. Orthostatic vitals taken (see above), however no c/o dizziness throughout positional changes. Required no physical assistance during ambulation in room, SV for safety. Requested to brush her teeth and use BR, able to tolerate doing those activities however c/o back pain. Deferred further ambulation d/t soft Bps. Transferred pt to chair and left with all needs met, call bell in reach.   Lauraine Erm Mobility Specialist Please contact via SecureChat or [employer] [phone]

## 2024-02-23 NOTE — TOC Progression Note (Signed)
 Transition of Care [facility]) - Progression Note    Patient Details  Name: Breanna Riley MRN: [id number] Date of Birth: 10-16-1960  Transition of Care [facility]) CM/SW Contact  Graves-Bigelow, Erminio Deems, RN Phone Number: 02/23/2024, 10:04 AM  Clinical Narrative: Inpatient Case Manager awaiting insurance authorization for [facility]. Will continue to follow for disposition needs.     Expected Discharge Plan: IP Rehab Facility Barriers to Discharge: Continued Medical Work up  Expected Discharge Plan and Services   Discharge Planning Services: CM Consult Post Acute Care Choice: IP Rehab Living arrangements for the past 2 months: Single Family Home  Social Drivers of Health (SDOH) Interventions SDOH Screenings   Food Insecurity: No Food Insecurity (02/14/2024)  Housing: Low Risk  (02/14/2024)  Transportation Needs: No Transportation Needs (02/14/2024)  Utilities: Not At Risk (02/14/2024)  Financial Resource Strain: Low Risk  (09/19/2023)   Received from [facility]  Social Connections: Moderately Integrated (02/14/2024)  Stress: No Stress Concern Present (12/12/2023)   Received from [facility]  Tobacco Use: Unknown (02/14/2024)    Readmission Risk Interventions     No data to display

## 2024-02-23 NOTE — Progress Notes (Addendum)
 PROGRESS NOTE    Breanna Riley  FMW:[id number] DOB: [birth year] DOA: 02/14/2024 PCP: Seabron Lenis, MD   63 y/o female with PMH for OSA, Obseity, HLD who presented with syncope. Patient had 3 syncope episodes at home, when EMS arrived systolic blood pressure was 72, given fluid bolus placed on epinephrine .labs cr 1.63 , troponin 52 , Lactic acid 4.9  -Chest radiograph with hypoinflation, with mild bilateral hilar vascular congestion. CT chest with extensive bilateral pulmonary emboli extending from the distal right and left main pulmonary arteries into lobar and segmental branches with significant right heart strain.  CT head with no acute changes.   -Admitted to the ICU, given 5 L of IV fluid followed by norepinephrine, IV heparin -Echo 10/1: EF 50-55% with RV strain and severely reduced RV, RVSP 42 10/01 bilateral pulmonary artery thrombectomy via R CFV  -10/1: transfer to ICU, s/p thrombectomy, requiring 6mcg norepinephrine.  -10/2 stable but still on levo 10mcg -10/3 weaning levophed as tolerated, + right/left DVT Lower extremities   10/05 transferred to [facility].  10/06 patient recovering well, continue very weak and deconditioned.  10/07 picc line removed, right groin sutures removed.    Subjective: - Feels weak, breathing improving  Assessment and Plan:  Acute pulmonary embolism with acute cor pulmonale  -Echo with EF 50 to 55%, grade 1 DD, severely reduced RV severe dilation of RV cavity, Moderate to severe tricuspid valve regurgitation.  - Appears to be slowly improving - Continue apixaban, would likely need this long-term  -Repeat echo to reassess RV -Continue midodrine - Potential risk factors morbid obesity, relative immobility and semaglutide  - Increase activity, still with soft blood pressures but overall better, would benefit from increased supervision and CIR at discharge -TOC following, discharge planning  Bilateral lower extremities deep vein thrombosis.  Right common  femoral vein  Left posterior tibial veins.  - Continue apixaban as above  R foot toe pain -check CT  Essential hypertension -Soft BP secondary to massive PE, continue midodrine  AKI (acute kidney injury) Hyponatremia.  - Stable  Dyslipidemia Continue with statin therapy .   GERD (gastroesophageal reflux disease) Continue with pantoprazole   Depression Continue with sertraline   Iron deficiency anemia Iron panel with serum iron of 22, TIBC 279, transferrin saturation 8 and ferritin 297 Sp IV Iron sucrose 200 mg x1   Follow iron panel as outpatient Hgb stable at 8,8   Obesity, class 3 (HCC) Calculated BMI is 56.8   DVT prophylaxis: Apixaban Code Status: Full code Family Communication: Son Disposition Plan: ? Rehab  Consultants:    Procedures:   Antimicrobials:    Objective: Vitals:   02/22/24 1616 02/22/24 2319 02/23/24 0539 02/23/24 0743  BP: (!) 98/40 101/71 (!) 92/59 (!) 89/41  Pulse: 85 88 91   Resp:  18 16 18   Temp: 97.8 F (36.6 C) 98 F (36.7 C) 97.8 F (36.6 C) 97.9 F (36.6 C)  TempSrc: Oral Oral Oral Oral  SpO2: 96% 95% 95% 97%  Weight:   124 kg   Height:        Intake/Output Summary (Last 24 hours) at 02/23/2024 1158 Last data filed at 02/23/2024 0940 Gross per 24 hour  Intake 720 ml  Output 950 ml  Net -230 ml   Filed Weights   02/21/24 0800 02/22/24 0349 02/23/24 0539  Weight: 123.6 kg 123 kg 124 kg    Examination:  General exam: Appears calm and comfortable obese, chronically ill Respiratory system: Clear to auscultation Cardiovascular  system: S1 & S2 heard, RRR.  Abd: nondistended, soft and nontender.Normal bowel sounds heard. Central nervous system: Alert and oriented. No focal neurological deficits. Extremities: no edema Skin: No rashes Psychiatry:  Mood & affect appropriate.     Data Reviewed:   CBC: Recent Labs  Lab 02/19/24 0600 02/20/24 0500 02/21/24 1120 02/22/24 0443 02/23/24 0407  WBC 5.5 6.2 7.5 6.4  6.5  HGB 8.8* 8.7* 9.8* 9.0* 8.8*  HCT 27.6* 27.3* 30.9* 27.9* 27.9*  MCV 87.6 88.6 88.3 88.3 88.9  PLT 241 234 304 148* 234   Basic Metabolic Panel: Recent Labs  Lab 02/17/24 0325 02/18/24 0420 02/19/24 0600 02/20/24 0500 02/21/24 1120 02/22/24 0817 02/23/24 0407  NA 134* 136 138 139 139 139 138  K 3.6 4.0 4.2 4.1 4.1 4.5 3.9  CL 101 98 101 103 97* 98 101  CO2 27 28 29 27 28 29 28   GLUCOSE 100* 91 92 92 93 112* 98  BUN 15 14 14 13 15 18 17   CREATININE 1.36* 1.13* 1.22* 1.20* 1.46* 1.36* 1.39*  CALCIUM 8.3* 8.8* 9.5 9.9 10.1 9.8 9.5  MG 2.5* 2.3 1.9 1.7  --  1.8  --    GFR: Estimated Creatinine Clearance: 48.1 mL/min (A) (by C-G formula based on SCr of 1.39 mg/dL (H)). Liver Function Tests: No results for input(s): AST, ALT, ALKPHOS, BILITOT, PROT, ALBUMIN in the last 168 hours. No results for input(s): LIPASE, AMYLASE in the last 168 hours. No results for input(s): AMMONIA in the last 168 hours. Coagulation Profile: No results for input(s): INR, PROTIME in the last 168 hours. Cardiac Enzymes: No results for input(s): CKTOTAL, CKMB, CKMBINDEX, TROPONINI in the last 168 hours. BNP (last 3 results) No results for input(s): PROBNP in the last 8760 hours. HbA1C: No results for input(s): HGBA1C in the last 72 hours. CBG: Recent Labs  Lab 02/18/24 1130  GLUCAP 81   Lipid Profile: No results for input(s): CHOL, HDL, LDLCALC, TRIG, CHOLHDL, LDLDIRECT in the last 72 hours. Thyroid  Function Tests: No results for input(s): TSH, T4TOTAL, FREET4, T3FREE, THYROIDAB in the last 72 hours. Anemia Panel: No results for input(s): VITAMINB12, FOLATE, FERRITIN, TIBC, IRON, RETICCTPCT in the last 72 hours. Urine analysis:    Component Value Date/Time   COLORURINE YELLOW 02/14/2024 1421   APPEARANCEUR CLEAR 02/14/2024 1421   LABSPEC 1.015 02/14/2024 1421   PHURINE 6.0 02/14/2024 1421   GLUCOSEU NEGATIVE 02/14/2024  1421   HGBUR NEGATIVE 02/14/2024 1421   BILIRUBINUR NEGATIVE 02/14/2024 1421   KETONESUR NEGATIVE 02/14/2024 1421   PROTEINUR 100 (A) 02/14/2024 1421   NITRITE NEGATIVE 02/14/2024 1421   LEUKOCYTESUR NEGATIVE 02/14/2024 1421   Sepsis Labs: @LABRCNTIP (procalcitonin:4,lacticidven:4)  ) Recent Results (from the past 240 hours)  Culture, blood (routine x 2)     Status: None   Collection Time: 02/14/24 11:32 AM   Specimen: BLOOD  Result Value Ref Range Status   Specimen Description BLOOD SITE NOT SPECIFIED  Final   Special Requests   Final    BOTTLES DRAWN AEROBIC AND ANAEROBIC Blood Culture adequate volume   Culture   Final    NO GROWTH 5 DAYS Performed at [facility], [address], [place], [place] [address]    Report Status 02/19/2024 FINAL  Final  Resp panel by RT-PCR (RSV, Flu A&B, Covid) Anterior Nasal Swab     Status: None   Collection Time: 02/14/24 11:32 AM   Specimen: Anterior Nasal Swab  Result Value Ref Range Status  SARS Coronavirus 2 by RT PCR NEGATIVE NEGATIVE Final   Influenza A by PCR NEGATIVE NEGATIVE Final   Influenza B by PCR NEGATIVE NEGATIVE Final    Comment: (NOTE) The Xpert Xpress SARS-CoV-2/FLU/RSV plus assay is intended as an aid in the diagnosis of influenza from Nasopharyngeal swab specimens and should not be used as a sole basis for treatment. Nasal washings and aspirates are unacceptable for Xpert Xpress SARS-CoV-2/FLU/RSV testing.  Fact Sheet for Patients: BloggerCourse.com  Fact Sheet for Healthcare Providers: SeriousBroker.it  This test is not yet approved or cleared by the [place]  FDA and has been authorized for detection and/or diagnosis of SARS-CoV-2 by FDA under an Emergency Use Authorization (EUA). This EUA will remain in effect (meaning this test can be used) for the duration of the COVID-19 declaration under Section 564(b)(1) of the Act, 21 U.S.C. section  360bbb-3(b)(1), unless the authorization is terminated or revoked.     Resp Syncytial Virus by PCR NEGATIVE NEGATIVE Final    Comment: (NOTE) Fact Sheet for Patients: BloggerCourse.com  Fact Sheet for Healthcare Providers: SeriousBroker.it  This test is not yet approved or cleared by the [place]  FDA and has been authorized for detection and/or diagnosis of SARS-CoV-2 by FDA under an Emergency Use Authorization (EUA). This EUA will remain in effect (meaning this test can be used) for the duration of the COVID-19 declaration under Section 564(b)(1) of the Act, 21 U.S.C. section 360bbb-3(b)(1), unless the authorization is terminated or revoked.  Performed at [facility], [address], [place], [place] [address]   Culture, blood (routine x 2)     Status: None   Collection Time: 02/14/24 12:43 PM   Specimen: BLOOD RIGHT HAND  Result Value Ref Range Status   Specimen Description BLOOD RIGHT HAND  Final   Special Requests   Final    BOTTLES DRAWN AEROBIC ONLY Blood Culture results may not be optimal due to an inadequate volume of blood received in culture bottles   Culture   Final    NO GROWTH 5 DAYS Performed at [facility], [address] [place], [place] [address]    Report Status 02/19/2024 FINAL  Final  MRSA Next Gen by PCR, Nasal     Status: None   Collection Time: 02/15/24 12:41 AM   Specimen: Nasal Mucosa; Nasal Swab  Result Value Ref Range Status   MRSA by PCR Next Gen NOT DETECTED NOT DETECTED Final    Comment: (NOTE) The GeneXpert MRSA Assay (FDA approved for NASAL specimens only), is one component of a comprehensive MRSA colonization surveillance program. It is not intended to diagnose MRSA infection nor to guide or monitor treatment for MRSA infections. Test performance is not FDA approved in patients less than 13 years old. Performed at [facility], [address] [place],  [place] [address]      Radiology Studies: No results found.   Scheduled Meds:  apixaban  10 mg Oral BID   Followed by   NOREEN ON 02/24/2024] apixaban  5 mg Oral BID   atorvastatin  20 mg Oral QHS   cholecalciferol  1,000 Units Oral BID   cyclobenzaprine  10 mg Oral QHS   iron polysaccharides  150 mg Oral Daily   midodrine  5 mg Oral BID WC   pantoprazole  40 mg Oral QAC breakfast   polyethylene glycol  17 g Oral BID   pregabalin  200 mg Oral TID   senna  1 tablet Oral  BID   sertraline  50 mg Oral Daily   sodium chloride flush  10-40 mL Intracatheter Q12H   Continuous Infusions:   LOS: 9 days    Time spent:    Sigurd Pac, MD [facility]   02/23/2024, 11:58 AM

## 2024-02-23 NOTE — Progress Notes (Signed)
 Physical Therapy Treatment Patient Details Name: Breanna Riley MRN: [id number] DOB: April 22, 1961 Today's Date: 02/23/2024   History of Present Illness 63 y.o. female admitted 10/1 with Bilateral Pulmonary Embolism with Cor Pulmonale  - presented with syncope. Bil DVT as well. 10/1 bleeding from femoral access site after sutures removed, required re-suturing and femstop.  PMH: obesity, OSA, HLD, osteoarthritis, and spinal stenosis s/p fusions.    PT Comments  Pt is progressing towards goals. Currently pt is supervision for sit to stand with rollator, supervision to CGA for gait with less than in-home distances due to poor endurance prior to requiring a seated rest break and Mod A for 1 step up per home set up. Due to pt current functional status, home set up and available assistance at home recommending skilled physical therapy services > 3 hours/day in order to address strength, balance and functional mobility to decrease risk for falls, injury, immobility, skin break down and re-hospitalization.      If plan is discharge home, recommend the following: Assistance with cooking/housework;Assist for transportation;Help with stairs or ramp for entrance;A little help with walking and/or transfers     Equipment Recommendations  Rollator (4 wheels) (bariatric width with youth height)       Precautions / Restrictions Precautions Precautions: Fall Recall of Precautions/Restrictions: Intact Restrictions Weight Bearing Restrictions Per Provider Order: No     Mobility  Bed Mobility     General bed mobility comments: in recliner on arrival and departure.    Transfers Overall transfer level: Needs assistance Equipment used: Rollator (4 wheels) Transfers: Sit to/from Stand Sit to Stand: Supervision           General transfer comment: Good hand placement, Pt needs supervision for safety due to heavy UE use to get to standing and use of UE on rollator to steady for balance in standing.     Ambulation/Gait Ambulation/Gait assistance: Contact guard assist, Supervision Gait Distance (Feet): 40 Feet (Pt is able to ambulate in 40 ft increments prior to requiring a 2-3 min seated rest break on rollator ambulating total of 240 ft.) Assistive device: Rollator (4 wheels) Gait Pattern/deviations: Step-through pattern, Decreased stride length, Wide base of support Gait velocity: decreased, grossly <0.4 m/s Gait velocity interpretation: <1.31 ft/sec, indicative of household ambulator   General Gait Details: Pt supervision to CGA for safety with rollator and for balance. Pt requires multiple seated rest breaks due to poor endurance during gait.   Stairs Stairs: Yes Stairs assistance: Mod assist Stair Management: Forwards, Step to pattern, No rails Number of Stairs: 1 General stair comments: per home set up pt attempted 1 step up and was Mod A to progress forward due to weakness in LE and poor endurance.     Balance Overall balance assessment: Mild deficits observed, not formally tested Sitting-balance support: No upper extremity supported, Feet supported Sitting balance-Leahy Scale: Good     Standing balance support: Single extremity supported, Bilateral upper extremity supported, Reliant on assistive device for balance Standing balance-Leahy Scale: Poor Standing balance comment: needs BUE support for dynamic tasks      Communication Communication Communication: No apparent difficulties  Cognition Arousal: Alert Behavior During Therapy: WFL for tasks assessed/performed   PT - Cognitive impairments: No apparent impairments     Following commands: Intact      Cueing Cueing Techniques: Verbal cues         Pertinent Vitals/Pain Pain Assessment Pain Assessment: No/denies pain     PT Goals (current goals can now be found  in the care plan section) Acute Rehab PT Goals Patient Stated Goal: to go home PT Goal Formulation: With patient Time For Goal Achievement:  03/02/24 Potential to Achieve Goals: Good Progress towards PT goals: Progressing toward goals    Frequency    Min 2X/week      PT Plan  Continue with current POC        AM-PAC PT 6 Clicks Mobility   Outcome Measure  Help needed turning from your back to your side while in a flat bed without using bedrails?: A Little Help needed moving from lying on your back to sitting on the side of a flat bed without using bedrails?: A Little Help needed moving to and from a bed to a chair (including a wheelchair)?: A Little Help needed standing up from a chair using your arms (e.g., wheelchair or bedside chair)?: A Little Help needed to walk in hospital room?: A Little Help needed climbing 3-5 steps with a railing? : Total 6 Click Score: 16    End of Session Equipment Utilized During Treatment: Gait belt Activity Tolerance: Patient tolerated treatment well Patient left: in chair;with call bell/phone within reach Nurse Communication: Mobility status PT Visit Diagnosis: Muscle weakness (generalized) (M62.81)     Time: 1330-1409 PT Time Calculation (min) (ACUTE ONLY): 39 min  Charges:    $Gait Training: 8-22 mins $Therapeutic Activity: 23-37 mins PT General Charges $$ ACUTE PT VISIT: 1 Visit                     Dorothyann Maier, DPT, CLT  Acute Rehabilitation Services Office: [phone] (Secure chat preferred)    Dorothyann VEAR Maier 02/23/2024, 3:00 PM

## 2024-02-24 DIAGNOSIS — I2609 Other pulmonary embolism with acute cor pulmonale: Secondary | ICD-10-CM | POA: Diagnosis not present

## 2024-02-24 LAB — CBC
HCT: 28.9 % — ABNORMAL LOW (ref 36.0–46.0)
Hemoglobin: 8.8 g/dL — ABNORMAL LOW (ref 12.0–15.0)
MCH: 28.1 pg (ref 26.0–34.0)
MCHC: 30.4 g/dL (ref 30.0–36.0)
MCV: 92.3 fL (ref 80.0–100.0)
Platelets: 221 K/uL (ref 150–400)
RBC: 3.13 MIL/uL — ABNORMAL LOW (ref 3.87–5.11)
RDW: 15.7 % — ABNORMAL HIGH (ref 11.5–15.5)
WBC: 6.6 K/uL (ref 4.0–10.5)
nRBC: 0 % (ref 0.0–0.2)

## 2024-02-24 LAB — BASIC METABOLIC PANEL WITH GFR
Anion gap: 11 (ref 5–15)
BUN: 16 mg/dL (ref 8–23)
CO2: 25 mmol/L (ref 22–32)
Calcium: 9.4 mg/dL (ref 8.9–10.3)
Chloride: 101 mmol/L (ref 98–111)
Creatinine, Ser: 1.28 mg/dL — ABNORMAL HIGH (ref 0.44–1.00)
GFR, Estimated: 46 mL/min — ABNORMAL LOW (ref >60)
Glucose, Bld: 77 mg/dL (ref 70–99)
Potassium: 4.2 mmol/L (ref 3.5–5.1)
Sodium: 137 mmol/L (ref 135–145)

## 2024-02-24 NOTE — TOC PASRR Note (Signed)
 [facility] IP TOC PASRR NOTE  RE: Breanna Riley  Date of Birth: 08-22-1960  Date:  02/24/2024    To Whom It May Concern:   Please be advised that the above-named patient will require a short-term nursing home stay - anticipated 30 days or less for rehabilitation and strengthening. The plan is for return home.

## 2024-02-24 NOTE — Progress Notes (Signed)
 Patient placed herself on home CPAP for the night

## 2024-02-24 NOTE — Progress Notes (Signed)
 OT Cancellation Note  Patient Details Name: Breanna Riley MRN: [id number] DOB: [birth year]   Cancelled Treatment:    Reason Eval/Treat Not Completed: Medical issues which prohibited therapy.  RN requests hold on skilled therapy treatment session until ortho consult complete for R foot. Will check back as schedule allows.  Pt. And son who was present in room aware and verbalize understanding.  [name]-COTA/L  02/24/2024, 11:46 AM

## 2024-02-24 NOTE — TOC Progression Note (Addendum)
 Transition of Care [facility]) - Progression Note    Patient Details  Name: Breanna Riley MRN: [id number] Date of Birth: July 02, 1960  Transition of Care [facility]) CM/SW Contact  Isaiah Public, LCSWA Phone Number: 02/24/2024, 1:28 PM  Clinical Narrative:     CSW spoke with patient. Patient agreeable for CSW to fax out initial referral for SNF placement as back up plan to inpatient rehab. [place] CM informed CSW that currently patients peer to peer is pending for inpatient rehab placement. CSW updated patient.CSW informed patient that CSW will follow up tomorrow with SNF rehab bed offers to review.  CSW will continue to follow.   Update- Patients passr currently pending.CSW submitted requested clinicals to [facility] for review.  Update- CSW received denial letter to provide to patient from [place] with [facility] inpatient rehab. Patient informed CSW she is going to call and start fast appeal. CSW informed MD.TOC will continue to follow.  Update- Patient informed CSW that she started appeal and insurance requesting clinicals. Patient request for CSW to send over clinicals requested by insurance. Patient gave CSW permission to send clinicals requested by patients insurance to [employer] appeals fax# [phone] for case # [id number]. CSW faxed over clinicals requested. Patient informed CSW that her insurance informed her that appeal could take up to 72 hours for determination. CSW will continue to follow.   Expected Discharge Plan: IP Rehab Facility Barriers to Discharge: Continued Medical Work up               Expected Discharge Plan and Services   Discharge Planning Services: CM Consult Post Acute Care Choice: IP Rehab Living arrangements for the past 2 months: Single Family Home                                       Social Drivers of Health (SDOH) Interventions SDOH Screenings   Food Insecurity: No Food Insecurity (02/14/2024)  Housing: Low Risk  (02/14/2024)  Transportation Needs: No  Transportation Needs (02/14/2024)  Utilities: Not At Risk (02/14/2024)  Financial Resource Strain: Low Risk  (09/19/2023)   Received from [facility]  Social Connections: Moderately Integrated (02/14/2024)  Stress: No Stress Concern Present (12/12/2023)   Received from [facility]  Tobacco Use: Unknown (02/14/2024)    Readmission Risk Interventions     No data to display

## 2024-02-24 NOTE — [place] FL2 (Signed)
 [place]  MEDICAID FL2 LEVEL OF CARE FORM     IDENTIFICATION  Patient Name: Breanna Riley Birthdate: 07/29/1960 Sex: female Admission Date (Current Location): 02/14/2024  [facility] and [place] Number:  Producer, television/film/video and Address:  [place]. [facility], [address], [place], [place] [address]      Provider Number: [id number]  Attending Physician Name and Address:  Fairy Frames, MD  Relative Name and Phone Number:  Jamar Sidney (son) [phone], Santa Richards (sister) [phone]    Current Level of Care: Hospital Recommended Level of Care: Skilled Nursing Facility Prior Approval Number:    Date Approved/Denied:   PASRR Number: PASRR under review  Discharge Plan: SNF    Current Diagnoses: Patient Active Problem List   Diagnosis Date Noted   AKI (acute kidney injury) 02/19/2024   Depression 02/19/2024   GERD (gastroesophageal reflux disease) 02/19/2024   Obesity, class 3 (HCC) 02/19/2024   Essential hypertension 02/19/2024   Dyslipidemia 02/19/2024   Iron deficiency anemia 02/19/2024   Acute pulmonary embolism with acute cor pulmonale (HCC) 02/15/2024   Syncope and collapse 02/14/2024   Syncope 02/14/2024    Orientation RESPIRATION BLADDER Height & Weight     Self, Time, Situation, Place  Normal Continent Weight: 274 lb 11.2 oz (124.6 kg) Height:  4' 11 (149.9 cm)  BEHAVIORAL SYMPTOMS/MOOD NEUROLOGICAL BOWEL NUTRITION STATUS      Continent Diet (Please see discharge summary)  AMBULATORY STATUS COMMUNICATION OF NEEDS Skin   Supervision Verbally Other (Comment) (Wound/Incision LDAs,Wound,other,toe,R,Wound,other,thigh,anterior,proximal,R)                       Personal Care Assistance Level of Assistance  Bathing, Feeding, Dressing Bathing Assistance: Maximum assistance Feeding assistance: Independent Dressing Assistance: Limited assistance     Functional Limitations Info  Sight, Hearing, Speech Sight Info: Adequate Hearing Info:  Adequate Speech Info: Adequate    SPECIAL CARE FACTORS FREQUENCY  PT (By licensed PT), OT (By licensed OT)     PT Frequency: 5x min weekly OT Frequency: 5x min weekly            Contractures Contractures Info: Not present    Additional Factors Info  Code Status, Allergies, Psychotropic Code Status Info: FULL Allergies Info: Naproxen Psychotropic Info: pregabalin (LYRICA) capsule 200 mg 3 times daily,sertraline (ZOLOFT) tablet 50 mg daily         Current Medications (02/24/2024):  This is the current hospital active medication list Current Facility-Administered Medications  Medication Dose Route Frequency Provider Last Rate Last Admin   acetaminophen (TYLENOL) tablet 650 mg  650 mg Oral Q6H PRN Amin, Ankit C, MD   650 mg at 02/16/24 2323   Or   acetaminophen (TYLENOL) suppository 650 mg  650 mg Rectal Q6H PRN Amin, Ankit C, MD       albuterol (PROVENTIL) (2.5 MG/3ML) 0.083% nebulizer solution 2.5 mg  2.5 mg Nebulization Q2H PRN Amin, Ankit C, MD       alum & mag hydroxide-simeth (MAALOX/MYLANTA) 200-200-20 MG/5ML suspension 30 mL  30 mL Oral Q4H PRN Sharie Bourbon, MD   30 mL at 02/22/24 2110   apixaban (ELIQUIS) tablet 5 mg  5 mg Oral BID Brubeck, Joshua C, NP       atorvastatin (LIPITOR) tablet 20 mg  20 mg Oral QHS Kakrakandy, Arshad N, MD   20 mg at 02/23/24 2159   cholecalciferol (VITAMIN D3) 25 MCG (1000 UNIT) tablet 1,000 Units  1,000 Units Oral BID Amin, Ankit  C, MD   1,000 Units at 02/24/24 0935   cyclobenzaprine (FLEXERIL) tablet 10 mg  10 mg Oral QHS Lee, [place], NP   10 mg at 02/23/24 2159   glucagon (human recombinant) (GLUCAGEN) injection 1 mg  1 mg Intravenous PRN Amin, Ankit C, MD       iron polysaccharides (NIFEREX) capsule 150 mg  150 mg Oral Daily Arrien, Elidia Sieving, MD   150 mg at 02/24/24 0935   lip balm (CARMEX) ointment 1 Application  1 Application Topical PRN Sharie Bourbon, MD       midodrine (PROAMATINE) tablet 5 mg  5 mg Oral BID WC  Joseph, Preetha, MD   5 mg at 02/24/24 0935   ondansetron  (ZOFRAN ) tablet 4 mg  4 mg Oral Q6H PRN Amin, Ankit C, MD       Or   ondansetron  (ZOFRAN ) injection 4 mg  4 mg Intravenous Q6H PRN Amin, Ankit C, MD       Oral care mouth rinse  15 mL Mouth Rinse PRN Sharie Bourbon, MD       oxyCODONE (Oxy IR/ROXICODONE) immediate release tablet 5 mg  5 mg Oral Q4H PRN Arrien, Mauricio Daniel, MD   5 mg at 02/23/24 2159   pantoprazole (PROTONIX) EC tablet 40 mg  40 mg Oral QAC breakfast Amin, Ankit C, MD   40 mg at 02/24/24 0935   polyethylene glycol (MIRALAX / GLYCOLAX) packet 17 g  17 g Oral BID Claudene Sieving BROCKS, MD   17 g at 02/22/24 1040   pregabalin (LYRICA) capsule 200 mg  200 mg Oral TID Kakrakandy, Arshad N, MD   200 mg at 02/24/24 0935   senna (SENOKOT) tablet 8.6 mg  1 tablet Oral BID Claudene Sieving BROCKS, MD   8.6 mg at 02/22/24 1042   sertraline (ZOLOFT) tablet 50 mg  50 mg Oral Daily Amin, Ankit C, MD   50 mg at 02/24/24 0935   sodium chloride flush (NS) 0.9 % injection 10-40 mL  10-40 mL Intracatheter Q12H Hussein, Bourbon, MD   10 mL at 02/23/24 2201   sodium chloride flush (NS) 0.9 % injection 10-40 mL  10-40 mL Intracatheter PRN Sharie Bourbon, MD       sodium phosphate (FLEET) enema 1 enema  1 enema Rectal Once PRN Amin, Ankit C, MD       traZODone (DESYREL) tablet 50 mg  50 mg Oral QHS PRN Gleason, Laura R, PA-C   50 mg at 02/22/24 2113     Discharge Medications: Please see discharge summary for a list of discharge medications.  Relevant Imaging Results:  Relevant Lab Results:   Additional Information [id number]  Isaiah Public, LCSWA

## 2024-02-24 NOTE — TOC Progression Note (Signed)
 Transition of Care [facility]) - Progression Note    Patient Details  Name: Breanna Riley MRN: [id number] Date of Birth: 08/17/1960  [facility] CM/SW Contact  Nola Devere Hands, RN Phone Number: 02/24/2024, 11:30 AM  Clinical Narrative:    Case Manager spoke with patient and her son Breanna Riley concerning IR denial by [employer].Patient plans to file appeal today. We discussed patient being willing to go to SNF for shortterm rehab. Patient and son voiced concerns for 25 minutes abut her inability to be home alone, he works and can only drop by, not stay with her. Patient agreed to [facility] contracting Social worker for assistance with locating a facility.    Expected Discharge Plan: IP Rehab Facility Barriers to Discharge: Continued Medical Work up               Expected Discharge Plan and Services   Discharge Planning Services: CM Consult Post Acute Care Choice: IP Rehab Living arrangements for the past 2 months: Single Family Home                                       Social Drivers of Health (SDOH) Interventions SDOH Screenings   Food Insecurity: No Food Insecurity (02/14/2024)  Housing: Low Risk  (02/14/2024)  Transportation Needs: No Transportation Needs (02/14/2024)  Utilities: Not At Risk (02/14/2024)  Financial Resource Strain: Low Risk  (09/19/2023)   Received from [facility]  Social Connections: Moderately Integrated (02/14/2024)  Stress: No Stress Concern Present (12/12/2023)   Received from [facility]  Tobacco Use: Unknown (02/14/2024)    Readmission Risk Interventions     No data to display

## 2024-02-24 NOTE — Plan of Care (Signed)
  Problem: Education: Goal: Knowledge of General Education information will improve Description: Including pain rating scale, medication(s)/side effects and non-pharmacologic comfort measures Outcome: Completed/Met   Problem: Health Behavior/Discharge Planning: Goal: Ability to manage health-related needs will improve Outcome: Progressing   Problem: Clinical Measurements: Goal: Ability to maintain clinical measurements within normal limits will improve Outcome: Progressing Goal: Will remain free from infection Outcome: Progressing Goal: Diagnostic test results will improve Outcome: Progressing Goal: Respiratory complications will improve Outcome: Progressing Goal: Cardiovascular complication will be avoided Outcome: Progressing   Problem: Activity: Goal: Risk for activity intolerance will decrease Outcome: Adequate for Discharge   Problem: Nutrition: Goal: Adequate nutrition will be maintained Outcome: Progressing   Problem: Coping: Goal: Level of anxiety will decrease Outcome: Progressing   Problem: Pain Managment: Goal: General experience of comfort will improve and/or be controlled Outcome: Progressing   Problem: Cardiovascular: Goal: Ability to achieve and maintain adequate cardiovascular perfusion will improve Outcome: Progressing Goal: Vascular access site(s) Level 0-1 will be maintained Outcome: Progressing

## 2024-02-24 NOTE — Progress Notes (Signed)
 Orthopedic Tech Progress Note Patient Details:  Breanna Riley 1960/11/18 [id number]  Patient ID: Breanna Riley, female   DOB: 03-May-1961, 63 y.o.   MRN: [id number] .Pt. Already has POS. Adine MARLA Blush 02/24/2024, 4:12 PM

## 2024-02-24 NOTE — Plan of Care (Signed)
  Problem: Education: Goal: Knowledge of General Education information will improve Description: Including pain rating scale, medication(s)/side effects and non-pharmacologic comfort measures Outcome: Progressing   Problem: Health Behavior/Discharge Planning: Goal: Ability to manage health-related needs will improve Outcome: Progressing   Problem: Clinical Measurements: Goal: Ability to maintain clinical measurements within normal limits will improve Outcome: Progressing Goal: Will remain free from infection Outcome: Progressing Goal: Diagnostic test results will improve Outcome: Progressing Goal: Respiratory complications will improve Outcome: Progressing Goal: Cardiovascular complication will be avoided Outcome: Progressing   Problem: Clinical Measurements: Goal: Respiratory complications will improve Outcome: Progressing   Problem: Clinical Measurements: Goal: Will remain free from infection Outcome: Progressing   Problem: Clinical Measurements: Goal: Ability to maintain clinical measurements within normal limits will improve Outcome: Progressing Goal: Will remain free from infection Outcome: Progressing Goal: Diagnostic test results will improve Outcome: Progressing Goal: Respiratory complications will improve Outcome: Progressing Goal: Cardiovascular complication will be avoided Outcome: Progressing

## 2024-02-24 NOTE — Progress Notes (Signed)
 Heart Failure Nurse Navigator Progress Note    Per Dr. Fairy and [place] Lee, NP. Patients original AHF appointment will be cancelled and she now has a HF TOC appointment on 03/07/2024 @ 8:15 am.     Stephane Haddock, BSN, RN Heart Failure Nurse Navigator Secure Chat Only

## 2024-02-24 NOTE — TOC Progression Note (Addendum)
 Transition of Care [facility]) - Progression Note    Patient Details  Name: Rashea Hoskie MRN: [id number] Date of Birth: 1960/06/03  [facility] CM/SW Contact  Nola Devere Hands, RN Phone Number: 02/24/2024, 2:23 PM  Clinical Narrative:    Received Official denial for [facility], [facility] is sending copy of FAST APPEAL information for patient. Will be delivered to patient by Isaiah PIES.    Expected Discharge Plan: IP Rehab Facility Barriers to Discharge: Continued Medical Work up               Expected Discharge Plan and Services   Discharge Planning Services: CM Consult Post Acute Care Choice: IP Rehab Living arrangements for the past 2 months: Single Family Home                                       Social Drivers of Health (SDOH) Interventions SDOH Screenings   Food Insecurity: No Food Insecurity (02/14/2024)  Housing: Low Risk  (02/14/2024)  Transportation Needs: No Transportation Needs (02/14/2024)  Utilities: Not At Risk (02/14/2024)  Financial Resource Strain: Low Risk  (09/19/2023)   [facility]  Social Connections: Moderately Integrated (02/14/2024)  Stress: No Stress Concern Present (12/12/2023)   Received from [facility]  Tobacco Use: Unknown (02/14/2024)    Readmission Risk Interventions     No data to display

## 2024-02-24 NOTE — Care Management Important Message (Signed)
 Important Message  Patient Details  Name: Breanna Riley MRN: [id number] Date of Birth: [birth year]   Important Message Given:  Yes - Medicare IM     Vonzell Arrie Sharps 02/24/2024, 10:07 AM

## 2024-02-24 NOTE — Progress Notes (Signed)
 PROGRESS NOTE    Breanna Riley  FMW:[id number] DOB: 05-02-1961 DOA: 02/14/2024 PCP: Seabron Lenis, MD   63 y/o female with PMH for OSA, Obseity, HLD who presented with syncope. Patient had 3 syncope episodes at home, when EMS arrived systolic blood pressure was 72, given fluid bolus placed on epinephrine .labs cr 1.63 , troponin 52 , Lactic acid 4.9  -Chest radiograph with hypoinflation, with mild bilateral hilar vascular congestion. CT chest with extensive bilateral pulmonary emboli extending from the distal right and left main pulmonary arteries into lobar and segmental branches with significant right heart strain.  CT head with no acute changes.   -Admitted to the ICU, given 5 L of IV fluid followed by norepinephrine, IV heparin -Echo 10/1: EF 50-55% with RV strain and severely reduced RV, RVSP 42 10/01 bilateral pulmonary artery thrombectomy via R CFV  -10/1: transfer to ICU, s/p thrombectomy, requiring 6mcg norepinephrine.  -10/2 stable but still on levo 10mcg -10/3 weaning levophed as tolerated, + right/left DVT Lower extremities   10/05 transferred to [facility].  10/06 patient recovering well, continue very weak and deconditioned.  10/07 picc line removed, right groin sutures removed.    Subjective: - Walked yesterday, toe continues to hurt  Assessment and Plan:  Acute pulmonary embolism with acute cor pulmonale  -Echo with EF 50 to 55%, grade 1 DD, severely reduced RV severe dilation of RV cavity, Moderate to severe tricuspid valve regurgitation.  - Appears to be slowly improving - Continue apixaban, would likely need this long-term  - Repeat echo with improvement in RV function, now mildly reduced RV -Continue midodrine - Potential risk factors morbid obesity, relative immobility and semaglutide  - Increase activity, still with soft blood pressures but overall better, would benefit from increased supervision and rehab at discharge - TOC following  Bilateral lower extremities deep  vein thrombosis.  Right common femoral vein  Left posterior tibial veins.  - Continue apixaban as above  R foot toe pain -CT notes fracture of fourth toe metatarsal extending proximally --Discussed with Ortho, recommended postop shoe and weightbearing as tolerated  Essential hypertension -Soft BP secondary to massive PE, continue midodrine  AKI (acute kidney injury) Hyponatremia.  - Stable  Dyslipidemia Continue with statin therapy .   GERD (gastroesophageal reflux disease) Continue with pantoprazole   Depression Continue with sertraline   Iron deficiency anemia Iron panel with serum iron of 22, TIBC 279, transferrin saturation 8 and ferritin 297 Sp IV Iron sucrose 200 mg x1   Follow iron panel as outpatient Hgb stable at 8,8   Obesity, class 3 (HCC) Calculated BMI is 56.8   DVT prophylaxis: Apixaban Code Status: Full code Family Communication: Son Disposition Plan: ? Rehab  Consultants:    Procedures:   Antimicrobials:    Objective: Vitals:   02/24/24 0015 02/24/24 0500 02/24/24 0931 02/24/24 1233  BP: 94/83 93/62 (!) 100/56 (!) 90/44  Pulse: 83   83  Resp: 17 16  14   Temp: 97.9 F (36.6 C) 97.9 F (36.6 C) 97.8 F (36.6 C) 98.6 F (37 C)  TempSrc: Oral Oral Oral Oral  SpO2: 97% 97%  99%  Weight:  124.6 kg    Height:        Intake/Output Summary (Last 24 hours) at 02/24/2024 1249 Last data filed at 02/24/2024 0900 Gross per 24 hour  Intake 1620 ml  Output 1500 ml  Net 120 ml   Filed Weights   02/22/24 0349 02/23/24 0539 02/24/24 0500  Weight: 123 kg  124 kg 124.6 kg    Examination:  General exam: Appears calm and comfortable obese, chronically ill Respiratory system: Clear to auscultation Cardiovascular system: S1 & S2 heard, RRR.  Abd: nondistended, soft and nontender.Normal bowel sounds heard. Central nervous system: Alert and oriented. No focal neurological deficits. Extremities: no edema right proximal foot fourth toe with  tenderness Skin: No rashes Psychiatry:  Mood & affect appropriate.     Data Reviewed:   CBC: Recent Labs  Lab 02/20/24 0500 02/21/24 1120 02/22/24 0443 02/23/24 0407 02/24/24 0450  WBC 6.2 7.5 6.4 6.5 6.6  HGB 8.7* 9.8* 9.0* 8.8* 8.8*  HCT 27.3* 30.9* 27.9* 27.9* 28.9*  MCV 88.6 88.3 88.3 88.9 92.3  PLT 234 304 148* 234 221   Basic Metabolic Panel: Recent Labs  Lab 02/18/24 0420 02/19/24 0600 02/20/24 0500 02/21/24 1120 02/22/24 0817 02/23/24 0407 02/24/24 0450  NA 136 138 139 139 139 138 137  K 4.0 4.2 4.1 4.1 4.5 3.9 4.2  CL 98 101 103 97* 98 101 101  CO2 28 29 27 28 29 28 25   GLUCOSE 91 92 92 93 112* 98 77  BUN 14 14 13 15 18 17 16   CREATININE 1.13* 1.22* 1.20* 1.46* 1.36* 1.39* 1.28*  CALCIUM 8.8* 9.5 9.9 10.1 9.8 9.5 9.4  MG 2.3 1.9 1.7  --  1.8  --   --    GFR: Estimated Creatinine Clearance: 52.4 mL/min (A) (by C-G formula based on SCr of 1.28 mg/dL (H)). Liver Function Tests: No results for input(s): AST, ALT, ALKPHOS, BILITOT, PROT, ALBUMIN in the last 168 hours. No results for input(s): LIPASE, AMYLASE in the last 168 hours. No results for input(s): AMMONIA in the last 168 hours. Coagulation Profile: No results for input(s): INR, PROTIME in the last 168 hours. Cardiac Enzymes: No results for input(s): CKTOTAL, CKMB, CKMBINDEX, TROPONINI in the last 168 hours. BNP (last 3 results) No results for input(s): PROBNP in the last 8760 hours. HbA1C: No results for input(s): HGBA1C in the last 72 hours. CBG: Recent Labs  Lab 02/18/24 1130  GLUCAP 81   Lipid Profile: No results for input(s): CHOL, HDL, LDLCALC, TRIG, CHOLHDL, LDLDIRECT in the last 72 hours. Thyroid  Function Tests: No results for input(s): TSH, T4TOTAL, FREET4, T3FREE, THYROIDAB in the last 72 hours. Anemia Panel: No results for input(s): VITAMINB12, FOLATE, FERRITIN, TIBC, IRON, RETICCTPCT in the last 72  hours. Urine analysis:    Component Value Date/Time   COLORURINE YELLOW 02/14/2024 1421   APPEARANCEUR CLEAR 02/14/2024 1421   LABSPEC 1.015 02/14/2024 1421   PHURINE 6.0 02/14/2024 1421   GLUCOSEU NEGATIVE 02/14/2024 1421   HGBUR NEGATIVE 02/14/2024 1421   BILIRUBINUR NEGATIVE 02/14/2024 1421   KETONESUR NEGATIVE 02/14/2024 1421   PROTEINUR 100 (A) 02/14/2024 1421   NITRITE NEGATIVE 02/14/2024 1421   LEUKOCYTESUR NEGATIVE 02/14/2024 1421   Sepsis Labs: @LABRCNTIP (procalcitonin:4,lacticidven:4)  ) Recent Results (from the past 240 hours)  MRSA Next Gen by PCR, Nasal     Status: None   Collection Time: 02/15/24 12:41 AM   Specimen: Nasal Mucosa; Nasal Swab  Result Value Ref Range Status   MRSA by PCR Next Gen NOT DETECTED NOT DETECTED Final    Comment: (NOTE) The GeneXpert MRSA Assay (FDA approved for NASAL specimens only), is one component of a comprehensive MRSA colonization surveillance program. It is not intended to diagnose MRSA infection nor to guide or monitor treatment for MRSA infections. Test performance is not FDA approved in patients less than 2 years  old. Performed at [facility], [address], [place], [place] [address]      Radiology Studies: CT FOOT RIGHT WO CONTRAST Result Date: 02/23/2024 CLINICAL DATA:  Foot pain and concern for fracture EXAM: CT OF THE RIGHT FOOT WITHOUT CONTRAST TECHNIQUE: Multidetector CT imaging of the right foot was performed according to the standard protocol. Multiplanar CT image reconstructions were also generated. RADIATION DOSE REDUCTION: This exam was performed according to the departmental dose-optimization program which includes automated exposure control, adjustment of the mA and/or kV according to patient size and/or use of iterative reconstruction technique. COMPARISON:  Radiographs 02/16/2024 FINDINGS: Bones/Joint/Cartilage Oblique fracture the proximal phalanx fourth toe extends from the medial proximal metaphysis to  the lateral distal metaphysis as on image 114 series 4. Plantar calcaneal spur. Ligaments Suboptimally assessed by CT. 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 Mildly thickened medial band of the plantar fascia, plantar fasciitis not excluded. Moderate regional muscular atrophy. Soft tissues Mild dorsal subcutaneous edema in the forefoot, nonspecific. IMPRESSION: 1. Acute oblique fracture of the proximal phalanx fourth toe extends from the medial proximal metaphysis to the lateral distal metaphysis. 2. 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 3. Mildly thickened medial band of the plantar fascia, plantar fasciitis not excluded. 4. Moderate regional muscular atrophy. 5. Mild dorsal subcutaneous edema in the forefoot, nonspecific. Electronically Signed   By: Ryan Salvage M.D.   On: 02/23/2024 15:09   ECHOCARDIOGRAM LIMITED Result Date: 02/23/2024    ECHOCARDIOGRAM LIMITED REPORT   Patient Name:   Breanna Riley Date of Exam: 02/23/2024 Medical Rec #:  [id number]   Height:       59.0 in Accession #:    [id number]  Weight:       273.3 lb Date of Birth:  1960/09/12    BSA:          2.106 m Patient Age:    63 years    BP:           91/58 mmHg Patient Gender: F           HR:           79 bpm. Exam Location:  Inpatient Procedure: Limited Echo and Intracardiac Opacification Agent (Both Spectral and            Color Flow Doppler were utilized during procedure). Indications:    Evaluate RV  History:        Patient has prior history of Echocardiogram examinations.                 Pulmonary Embolism; Signs/Symptoms:Shortness of Breath.  Sonographer:    Norleen Amour Referring Phys: [phone] [place] Souter IMPRESSIONS  1. Left ventricular ejection fraction, by estimation, is 50 to 55%. The left ventricle has normal function. The left ventricle has no regional wall motion abnormalities. Left ventricular diastolic function  could not be evaluated.  2. Peak RV-RA gradient 23 mmHg. Right ventricular systolic function is mildly reduced. The right ventricular size is mildly enlarged.  3. The mitral valve is normal in structure. No evidence of mitral valve regurgitation. No evidence of mitral stenosis.  4. The aortic valve is tricuspid. There is moderate calcification of the aortic valve. Aortic valve regurgitation is not visualized. Aortic valve sclerosis/calcification is present, without any evidence of aortic stenosis.  5. IVC not visualized.  6. Limited echo for RV.  7. Technically difficult study with poor acoustic windows. FINDINGS  Left Ventricle: Left ventricular ejection fraction, by estimation, is 50 to 55%. The left ventricle has normal function. The left ventricle has no regional wall motion abnormalities. The left ventricular internal cavity size was normal in size. There is  no left ventricular hypertrophy. Left ventricular diastolic function could not be evaluated. Right Ventricle: Peak RV-RA gradient 23 mmHg. The right ventricular size is mildly enlarged. No increase in right ventricular wall thickness. Right ventricular systolic function is mildly reduced. Left Atrium: Left atrial size was normal in size. Right Atrium: Right atrial size was normal in size. Mitral Valve: The mitral valve is normal in structure. No evidence of mitral valve stenosis. Tricuspid Valve: The tricuspid valve is normal in structure. Tricuspid valve regurgitation is trivial. Aortic Valve: The aortic valve is tricuspid. There is moderate calcification of the aortic valve. Aortic valve regurgitation is not visualized. Aortic valve sclerosis/calcification is present, without any evidence of aortic stenosis. Venous: IVC not visualized. IAS/Shunts: No atrial level shunt detected by color flow Doppler. LEFT VENTRICLE PLAX 2D LVIDd:         4.30 cm LVIDs:         2.80 cm LV PW:         0.90 cm LV IVS:        0.80 cm  LV Volumes (MOD) LV vol d, MOD A2C:  111.0 ml LV vol d, MOD A4C: 96.6 ml LV vol s, MOD A2C: 25.5 ml LV vol s, MOD A4C: 42.5 ml LV SV MOD A2C:     85.5 ml LV SV MOD A4C:     96.6 ml LV SV MOD BP:      75.6 ml RIGHT VENTRICLE RV Basal diam:  3.00 cm RV Mid diam:    3.00 cm RV FAC:         23.5 % RV S prime:     12.40 cm/s TAPSE (M-mode): 1.6 cm TRICUSPID VALVE TR Peak grad:   23.0 mmHg TR Vmax:        240.00 cm/s Dalton McleanMD Electronically signed by Ezra Kanner Signature Date/Time: 02/23/2024/2:21:28 PM    Final      Scheduled Meds:  apixaban  5 mg Oral BID   atorvastatin  20 mg Oral QHS   cholecalciferol  1,000 Units Oral BID   cyclobenzaprine  10 mg Oral QHS   iron polysaccharides  150 mg Oral Daily   midodrine  5 mg Oral BID WC   pantoprazole  40 mg Oral QAC breakfast   polyethylene glycol  17 g Oral BID   pregabalin  200 mg Oral TID   senna  1 tablet Oral BID   sertraline  50 mg Oral Daily   sodium chloride flush  10-40 mL Intracatheter Q12H   Continuous Infusions:   LOS: 10 days    Time spent:    Sigurd Pac, MD [facility]   02/24/2024, 12:49 PM

## 2024-02-25 DIAGNOSIS — I2609 Other pulmonary embolism with acute cor pulmonale: Secondary | ICD-10-CM | POA: Diagnosis not present

## 2024-02-25 LAB — CBC
HCT: 26.8 % — ABNORMAL LOW (ref 36.0–46.0)
Hemoglobin: 8.4 g/dL — ABNORMAL LOW (ref 12.0–15.0)
MCH: 28.5 pg (ref 26.0–34.0)
MCHC: 31.3 g/dL (ref 30.0–36.0)
MCV: 90.8 fL (ref 80.0–100.0)
Platelets: 256 K/uL (ref 150–400)
RBC: 2.95 MIL/uL — ABNORMAL LOW (ref 3.87–5.11)
RDW: 15.5 % (ref 11.5–15.5)
WBC: 5.3 K/uL (ref 4.0–10.5)
nRBC: 0 % (ref 0.0–0.2)

## 2024-02-25 LAB — BASIC METABOLIC PANEL WITH GFR
Anion gap: 9 (ref 5–15)
BUN: 15 mg/dL (ref 8–23)
CO2: 27 mmol/L (ref 22–32)
Calcium: 9.7 mg/dL (ref 8.9–10.3)
Chloride: 103 mmol/L (ref 98–111)
Creatinine, Ser: 1.2 mg/dL — ABNORMAL HIGH (ref 0.44–1.00)
GFR, Estimated: 50 mL/min — ABNORMAL LOW (ref >60)
Glucose, Bld: 98 mg/dL (ref 70–99)
Potassium: 4.3 mmol/L (ref 3.5–5.1)
Sodium: 139 mmol/L (ref 135–145)

## 2024-02-25 NOTE — Plan of Care (Signed)
   Problem: Nutrition: Goal: Adequate nutrition will be maintained Outcome: Progressing

## 2024-02-25 NOTE — Progress Notes (Signed)
Placed patient on home CPAP for the night with oxygen set at 2lpm  

## 2024-02-25 NOTE — Progress Notes (Signed)
 PROGRESS NOTE    Breanna Riley  FMW:[id number] DOB: Feb 16, 1961 DOA: 02/14/2024 PCP: Seabron Lenis, MD   63 y/o female with PMH for OSA, Obseity, HLD who presented with syncope. Patient had 3 syncope episodes at home, when EMS arrived systolic blood pressure was 72, given fluid bolus placed on epinephrine .labs cr 1.63 , troponin 52 , Lactic acid 4.9  -Chest radiograph with hypoinflation, with mild bilateral hilar vascular congestion. CT chest with extensive bilateral pulmonary emboli extending from the distal right and left main pulmonary arteries into lobar and segmental branches with significant right heart strain.  CT head with no acute changes.   -Admitted to the ICU, given 5 L of IV fluid followed by norepinephrine, IV heparin -Echo 10/1: EF 50-55% with RV strain and severely reduced RV, RVSP 42 10/01 bilateral pulmonary artery thrombectomy via R CFV  -10/1: transfer to ICU, s/p thrombectomy, requiring 6mcg norepinephrine.  -10/2 stable but still on levo 10mcg -10/3 weaning levophed as tolerated, + right/left DVT Lower extremities   10/05 transferred to [facility].  10/06 patient recovering well, continue very weak and deconditioned.  10/07 picc line removed, right groin sutures removed.  -10/9, right toe foot pain, CT with fourth metatarsal fracture -10/10, insurance denied inpatient rehab, patient submitted appeal   Subjective: - Feels okay, no events overnight  Assessment and Plan:  Acute pulmonary embolism with acute cor pulmonale  -Echo with EF 50 to 55%, grade 1 DD, severely reduced RV severe dilation of RV cavity, Moderate to severe tricuspid valve regurgitation.  - Improving - Continue apixaban, would likely need this long-term  - Repeat echo with improvement in RV function, now mildly reduced RV -Continue midodrine - Potential risk factors morbid obesity, relative immobility and semaglutide  - Remains medically stable for discharge, inpatient rehab denied by insurance, patient  has submitted an appeal as of 10/10, TOC following  Bilateral lower extremities deep vein thrombosis.  Right common femoral vein  Left posterior tibial veins.  - Continue apixaban as above  R foot fourth metatarsal fracture -CT notes fracture of fourth toe metatarsal extending proximally --Discussed with Ortho, recommended postop shoe and weightbearing as tolerated  Essential hypertension -Soft BP secondary to massive PE, continue midodrine  AKI (acute kidney injury) Hyponatremia.  - Stable  Dyslipidemia Continue with statin therapy .   GERD (gastroesophageal reflux disease) Continue with pantoprazole   Depression Continue with sertraline   Iron deficiency anemia Iron panel with serum iron of 22, TIBC 279, transferrin saturation 8 and ferritin 297 Sp IV Iron sucrose 200 mg x1   Follow iron panel as outpatient Hgb stable at 8,8  - Repeat IV iron tomorrow  Obesity, class 3 (HCC) Calculated BMI is 56.8   DVT prophylaxis: Apixaban Code Status: Full code Family Communication: Son yesterday Disposition Plan: To be determined, awaiting appeal decision  Consultants:    Procedures:   Antimicrobials:    Objective: Vitals:   02/24/24 2300 02/25/24 0042 02/25/24 0500 02/25/24 0700  BP:  (!) 95/47 (!) 91/43 (!) 91/49  Pulse:  77 69 80  Resp:  15 17 16   Temp:  98.1 F (36.7 C) 97.6 F (36.4 C) (!) 97.5 F (36.4 C)  TempSrc: Oral Axillary Oral Oral  SpO2:  100% 98% 100%  Weight:   124.3 kg   Height:        Intake/Output Summary (Last 24 hours) at 02/25/2024 1135 Last data filed at 02/25/2024 1028 Gross per 24 hour  Intake 1431 ml  Output 700  ml  Net 731 ml   Filed Weights   02/23/24 0539 02/24/24 0500 02/25/24 0500  Weight: 124 kg 124.6 kg 124.3 kg    Examination:  General exam: Appears calm and comfortable obese, chronically ill AAO x 3 Respiratory system: Clear Cardiovascular system: S1 & S2 heard, RRR.  Abd: nondistended, soft and  nontender.Normal bowel sounds heard. Central nervous system: Alert and oriented. No focal neurological deficits. Extremities: no edema right proximal foot fourth toe with tenderness Skin: No rashes Psychiatry:  Mood & affect appropriate.     Data Reviewed:   CBC: Recent Labs  Lab 02/21/24 1120 02/22/24 0443 02/23/24 0407 02/24/24 0450 02/25/24 0453  WBC 7.5 6.4 6.5 6.6 5.3  HGB 9.8* 9.0* 8.8* 8.8* 8.4*  HCT 30.9* 27.9* 27.9* 28.9* 26.8*  MCV 88.3 88.3 88.9 92.3 90.8  PLT 304 148* 234 221 256   Basic Metabolic Panel: Recent Labs  Lab 02/19/24 0600 02/20/24 0500 02/21/24 1120 02/22/24 0817 02/23/24 0407 02/24/24 0450 02/25/24 0453  NA 138 139 139 139 138 137 139  K 4.2 4.1 4.1 4.5 3.9 4.2 4.3  CL 101 103 97* 98 101 101 103  CO2 29 27 28 29 28 25 27   GLUCOSE 92 92 93 112* 98 77 98  BUN 14 13 15 18 17 16 15   CREATININE 1.22* 1.20* 1.46* 1.36* 1.39* 1.28* 1.20*  CALCIUM 9.5 9.9 10.1 9.8 9.5 9.4 9.7  MG 1.9 1.7  --  1.8  --   --   --    GFR: Estimated Creatinine Clearance: 55.8 mL/min (A) (by C-G formula based on SCr of 1.2 mg/dL (H)). Liver Function Tests: No results for input(s): AST, ALT, ALKPHOS, BILITOT, PROT, ALBUMIN in the last 168 hours. No results for input(s): LIPASE, AMYLASE in the last 168 hours. No results for input(s): AMMONIA in the last 168 hours. Coagulation Profile: No results for input(s): INR, PROTIME in the last 168 hours. Cardiac Enzymes: No results for input(s): CKTOTAL, CKMB, CKMBINDEX, TROPONINI in the last 168 hours. BNP (last 3 results) No results for input(s): PROBNP in the last 8760 hours. HbA1C: No results for input(s): HGBA1C in the last 72 hours. CBG: No results for input(s): GLUCAP in the last 168 hours.  Lipid Profile: No results for input(s): CHOL, HDL, LDLCALC, TRIG, CHOLHDL, LDLDIRECT in the last 72 hours. Thyroid  Function Tests: No results for input(s): TSH, T4TOTAL,  FREET4, T3FREE, THYROIDAB in the last 72 hours. Anemia Panel: No results for input(s): VITAMINB12, FOLATE, FERRITIN, TIBC, IRON, RETICCTPCT in the last 72 hours. Urine analysis:    Component Value Date/Time   COLORURINE YELLOW 02/14/2024 1421   APPEARANCEUR CLEAR 02/14/2024 1421   LABSPEC 1.015 02/14/2024 1421   PHURINE 6.0 02/14/2024 1421   GLUCOSEU NEGATIVE 02/14/2024 1421   HGBUR NEGATIVE 02/14/2024 1421   BILIRUBINUR NEGATIVE 02/14/2024 1421   KETONESUR NEGATIVE 02/14/2024 1421   PROTEINUR 100 (A) 02/14/2024 1421   NITRITE NEGATIVE 02/14/2024 1421   LEUKOCYTESUR NEGATIVE 02/14/2024 1421   Sepsis Labs: @LABRCNTIP (procalcitonin:4,lacticidven:4)  ) No results found for this or any previous visit (from the past 240 hours).    Radiology Studies: CT FOOT RIGHT WO CONTRAST Result Date: 02/23/2024 CLINICAL DATA:  Foot pain and concern for fracture EXAM: CT OF THE RIGHT FOOT WITHOUT CONTRAST TECHNIQUE: Multidetector CT imaging of the right foot was performed according to the standard protocol. Multiplanar CT image reconstructions were also generated. RADIATION DOSE REDUCTION: This exam was performed according to the departmental dose-optimization program which includes automated  exposure control, adjustment of the mA and/or kV according to patient size and/or use of iterative reconstruction technique. COMPARISON:  Radiographs 02/16/2024 FINDINGS: Bones/Joint/Cartilage Oblique fracture the proximal phalanx fourth toe extends from the medial proximal metaphysis to the lateral distal metaphysis as on image 114 series 4. Plantar calcaneal spur. Ligaments Suboptimally assessed by CT. 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 Mildly thickened medial band of the plantar fascia, plantar fasciitis not excluded. Moderate regional muscular atrophy. Soft tissues Mild dorsal subcutaneous edema in the forefoot, nonspecific.  IMPRESSION: 1. Acute oblique fracture of the proximal phalanx fourth toe extends from the medial proximal metaphysis to the lateral distal metaphysis. 2. 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 3. Mildly thickened medial band of the plantar fascia, plantar fasciitis not excluded. 4. Moderate regional muscular atrophy. 5. Mild dorsal subcutaneous edema in the forefoot, nonspecific. Electronically Signed   By: Ryan Salvage M.D.   On: 02/23/2024 15:09   ECHOCARDIOGRAM LIMITED Result Date: 02/23/2024    ECHOCARDIOGRAM LIMITED REPORT   Patient Name:   NIEVES CHAPA Date of Exam: 02/23/2024 Medical Rec #:  [id number]   Height:       59.0 in Accession #:    [id number]  Weight:       273.3 lb Date of Birth:  Jan 29, 1961    BSA:          2.106 m Patient Age:    63 years    BP:           91/58 mmHg Patient Gender: F           HR:           79 bpm. Exam Location:  Inpatient Procedure: Limited Echo and Intracardiac Opacification Agent (Both Spectral and            Color Flow Doppler were utilized during procedure). Indications:    Evaluate RV  History:        Patient has prior history of Echocardiogram examinations.                 Pulmonary Embolism; Signs/Symptoms:Shortness of Breath.  Sonographer:    Norleen Amour Referring Phys: [phone] Dietrick Barris IMPRESSIONS  1. Left ventricular ejection fraction, by estimation, is 50 to 55%. The left ventricle has normal function. The left ventricle has no regional wall motion abnormalities. Left ventricular diastolic function could not be evaluated.  2. Peak RV-RA gradient 23 mmHg. Right ventricular systolic function is mildly reduced. The right ventricular size is mildly enlarged.  3. The mitral valve is normal in structure. No evidence of mitral valve regurgitation. No evidence of mitral stenosis.  4. The aortic valve is tricuspid. There is moderate calcification of the aortic valve. Aortic valve regurgitation is not visualized. Aortic  valve sclerosis/calcification is present, without any evidence of aortic stenosis.  5. IVC not visualized.  6. Limited echo for RV.  7. Technically difficult study with poor acoustic windows. FINDINGS  Left Ventricle: Left ventricular ejection fraction, by estimation, is 50 to 55%. The left ventricle has normal function. The left ventricle has no regional wall motion abnormalities. The left ventricular internal cavity size was normal in size. There is  no left ventricular hypertrophy. Left ventricular diastolic function could not be evaluated. Right Ventricle: Peak RV-RA gradient 23 mmHg. The right ventricular size is mildly enlarged. No increase in right ventricular wall thickness. Right ventricular systolic function is mildly reduced. Left Atrium: Left  atrial size was normal in size. Right Atrium: Right atrial size was normal in size. Mitral Valve: The mitral valve is normal in structure. No evidence of mitral valve stenosis. Tricuspid Valve: The tricuspid valve is normal in structure. Tricuspid valve regurgitation is trivial. Aortic Valve: The aortic valve is tricuspid. There is moderate calcification of the aortic valve. Aortic valve regurgitation is not visualized. Aortic valve sclerosis/calcification is present, without any evidence of aortic stenosis. Venous: IVC not visualized. IAS/Shunts: No atrial level shunt detected by color flow Doppler. LEFT VENTRICLE PLAX 2D LVIDd:         4.30 cm LVIDs:         2.80 cm LV PW:         0.90 cm LV IVS:        0.80 cm  LV Volumes (MOD) LV vol d, MOD A2C: 111.0 ml LV vol d, MOD A4C: 96.6 ml LV vol s, MOD A2C: 25.5 ml LV vol s, MOD A4C: 42.5 ml LV SV MOD A2C:     85.5 ml LV SV MOD A4C:     96.6 ml LV SV MOD BP:      75.6 ml RIGHT VENTRICLE RV Basal diam:  3.00 cm RV Mid diam:    3.00 cm RV FAC:         23.5 % RV S prime:     12.40 cm/s TAPSE (M-mode): 1.6 cm TRICUSPID VALVE TR Peak grad:   23.0 mmHg TR Vmax:        240.00 cm/s Dalton McleanMD Electronically signed by  Ezra Kanner Signature Date/Time: 02/23/2024/2:21:28 PM    Final      Scheduled Meds:  apixaban  5 mg Oral BID   atorvastatin  20 mg Oral QHS   cholecalciferol  1,000 Units Oral BID   cyclobenzaprine  10 mg Oral QHS   iron polysaccharides  150 mg Oral Daily   midodrine  5 mg Oral BID WC   pantoprazole  40 mg Oral QAC breakfast   polyethylene glycol  17 g Oral BID   pregabalin  200 mg Oral TID   senna  1 tablet Oral BID   sertraline  50 mg Oral Daily   Continuous Infusions:   LOS: 11 days    Time spent:    Sigurd Pac, MD [facility]   02/25/2024, 11:35 AM

## 2024-02-26 LAB — CBC
HCT: 25.6 % — ABNORMAL LOW (ref 36.0–46.0)
Hemoglobin: 7.9 g/dL — ABNORMAL LOW (ref 12.0–15.0)
MCH: 27.8 pg (ref 26.0–34.0)
MCHC: 30.9 g/dL (ref 30.0–36.0)
MCV: 90.1 fL (ref 80.0–100.0)
Platelets: 266 K/uL (ref 150–400)
RBC: 2.84 MIL/uL — ABNORMAL LOW (ref 3.87–5.11)
RDW: 15.3 % (ref 11.5–15.5)
WBC: 5.3 K/uL (ref 4.0–10.5)
nRBC: 0 % (ref 0.0–0.2)

## 2024-02-26 LAB — BASIC METABOLIC PANEL WITH GFR
Anion gap: 10 (ref 5–15)
BUN: 14 mg/dL (ref 8–23)
CO2: 25 mmol/L (ref 22–32)
Calcium: 9.4 mg/dL (ref 8.9–10.3)
Chloride: 104 mmol/L (ref 98–111)
Creatinine, Ser: 1.15 mg/dL — ABNORMAL HIGH (ref 0.44–1.00)
GFR, Estimated: 53 mL/min — ABNORMAL LOW (ref >60)
Glucose, Bld: 89 mg/dL (ref 70–99)
Potassium: 4.1 mmol/L (ref 3.5–5.1)
Sodium: 139 mmol/L (ref 135–145)

## 2024-02-26 MED ORDER — SODIUM CHLORIDE 0.9 % IV SOLN: INTRAVENOUS | Status: AC | PRN

## 2024-02-26 MED ORDER — SODIUM CHLORIDE 0.9 % IV SOLN
400.0000 mg | Freq: Once | INTRAVENOUS | Status: AC
  Administered 2024-02-26: 400 mg via INTRAVENOUS
  Filled 2024-02-26: qty 20

## 2024-02-26 NOTE — Plan of Care (Signed)
   Problem: Nutrition: Goal: Adequate nutrition will be maintained Outcome: Progressing

## 2024-02-26 NOTE — Progress Notes (Signed)
 Patient seen and examined, no changes from my note yesterday, remains medically stable -She is awaiting appeal decision for inpatient rehab at [facility], [facility] following will -Will administer IV iron for chronic iron deficiency anemia with mild worsening  Sigurd Pac, MD

## 2024-02-26 NOTE — Progress Notes (Signed)
 Mobility Specialist Progress Note;   02/26/24 0936  Mobility  Activity Ambulated with assistance  Level of Assistance Standby assist, set-up cues, supervision of patient - no hands on  Assistive Device Four wheel walker  Distance Ambulated (ft) 250 ft  Activity Response Tolerated well  Mobility Referral Yes  Mobility visit 1 Mobility  Mobility Specialist Start Time (ACUTE ONLY) [id number]  Mobility Specialist Stop Time (ACUTE ONLY) 0949  Mobility Specialist Time Calculation (min) (ACUTE ONLY) 13 min   Answered pts call light from BR, agreeable to ambulate after. Assisted pt in pericare as pt is unable to reach herself. Ambulated in hallway w/ SV. Periodically took seated rest breaks d/t fatigue and SOB. Rested about 2-3 min each break. VSS throughout. Pt returned back to room and in chair for NT to assist in bathing.   Lauraine Erm Mobility Specialist Please contact via SecureChat or [employer] [phone]

## 2024-02-27 ENCOUNTER — Other Ambulatory Visit (HOSPITAL_COMMUNITY): Payer: Self-pay

## 2024-02-27 DIAGNOSIS — I2609 Other pulmonary embolism with acute cor pulmonale: Secondary | ICD-10-CM | POA: Diagnosis not present

## 2024-02-27 LAB — CBC
HCT: 27.9 % — ABNORMAL LOW (ref 36.0–46.0)
Hemoglobin: 8.9 g/dL — ABNORMAL LOW (ref 12.0–15.0)
MCH: 29.1 pg (ref 26.0–34.0)
MCHC: 31.9 g/dL (ref 30.0–36.0)
MCV: 91.2 fL (ref 80.0–100.0)
Platelets: 265 K/uL (ref 150–400)
RBC: 3.06 MIL/uL — ABNORMAL LOW (ref 3.87–5.11)
RDW: 15.7 % — ABNORMAL HIGH (ref 11.5–15.5)
WBC: 4.8 K/uL (ref 4.0–10.5)
nRBC: 0 % (ref 0.0–0.2)

## 2024-02-27 MED ORDER — APIXABAN 5 MG PO TABS
5.0000 mg | ORAL_TABLET | Freq: Two times a day (BID) | ORAL | 1 refills | Status: AC
  Filled 2024-02-27 – 2024-02-28 (×2): qty 60, 30d supply, fill #0

## 2024-02-27 MED ORDER — MIDODRINE HCL 5 MG PO TABS
5.0000 mg | ORAL_TABLET | Freq: Two times a day (BID) | ORAL | 0 refills | Status: DC
  Filled 2024-02-27 – 2024-02-28 (×2): qty 60, 30d supply, fill #0

## 2024-02-27 NOTE — Discharge Summary (Signed)
 Physician Discharge Summary  Anairis Knick FMW:[id number] DOB: 1961-02-24 DOA: 02/14/2024  PCP: Seabron Lenis, MD  Admit date: 02/14/2024 Discharge date: 02/28/2024  Time spent:45 minutes  Recommendations for Outpatient Follow-up:  PCP in 1 week CHF TOC clinic on 10/22   Discharge Diagnoses:  Principal Problem:   Acute pulmonary embolism with acute cor pulmonale (HCC) Obstructive shock Bilateral DVT   Essential hypertension   AKI (acute kidney injury)   Dyslipidemia   GERD (gastroesophageal reflux disease)   Depression   Iron deficiency anemia   Obesity, class 3 (HCC)   Discharge Condition: improved  Diet recommendation: Low-sodium, heart healthy  Filed Weights   02/26/24 0621 02/27/24 0550 02/28/24 0521  Weight: 124.4 kg 125.1 kg 125.7 kg    History of present illness:  63 y/o female with PMH for OSA, Obseity, HLD who presented with syncope. Patient had 3 syncope episodes at home, when EMS arrived systolic blood pressure was 72, given fluid bolus placed on epinephrine .labs cr 1.63 , troponin 52 , Lactic acid 4.9  -Chest radiograph with hypoinflation, with mild bilateral hilar vascular congestion. CT chest with extensive bilateral pulmonary emboli extending from the distal right and left main pulmonary arteries into lobar and segmental branches with significant right heart strain.  CT head with no acute changes.   -Admitted to the ICU, given 5 L of IV fluid followed by norepinephrine, IV heparin -Echo 10/1: EF 50-55% with RV strain and severely reduced RV, RVSP 42 10/01 bilateral pulmonary artery thrombectomy via R CFV  -10/1: transfer to ICU, s/p thrombectomy, requiring 6mcg norepinephrine.  -10/2 stable but still on levo 10mcg -10/3 weaning levophed as tolerated, + right/left DVT Lower extremities   10/05 transferred to [facility].  10/06 patient recovering well, continue very weak and deconditioned.  10/07 picc line removed, right groin sutures removed.  -10/9, right toe  foot pain, CT with fourth metatarsal fracture -10/10, insurance denied inpatient rehab, patient submitted appeal  Hospital Course:   Acute pulmonary embolism with acute cor pulmonale Obstructive shock -Echo with EF 50 to 55%, grade 1 DD, severely reduced RV severe dilation of RV cavity, Moderate to severe tricuspid valve regurgitation.  - Improving - Continue apixaban, would likely need this long-term  - Repeat echo with improvement in RV function, now mildly reduced RV - BP still soft but overall improving, continue midodrine, discontinued chlorthalidone, Lasix PRN at DC - Potential risk factors morbid obesity, relative immobility and semaglutide  - Remains medically stable for discharge, inpatient rehab denied by insurance, patient has submitted an appeal as of 10/10, TOC following, pt requests home therapy if appeal is denied - Assess for GDMT at follow-up if blood pressure tolerates   Bilateral lower extremities deep vein thrombosis.  Right common femoral vein  Left posterior tibial veins.  - Continue apixaban as above   R foot fourth metatarsal fracture -CT notes fracture of fourth toe metatarsal extending proximally --Discussed with Ortho, recommended postop shoe and weightbearing as tolerated   Essential hypertension -Soft BP secondary to massive PE, continue midodrine   AKI (acute kidney injury) Hyponatremia.  - Stable   Dyslipidemia Continue with statin therapy .    GERD (gastroesophageal reflux disease) Continue with pantoprazole    Depression Continue with sertraline    Iron deficiency anemia Iron panel with serum iron of 22, TIBC 279, transferrin saturation 8 and ferritin 297 Sp IV Iron sucrose 200 mg x1   Follow iron panel as outpatient Hgb stable at 8,8  - Repeat IV  iron given yesterday -resume oral Fe at DC   Obesity, class 3 (HCC) Calculated BMI is 56.8   Discharge Exam: Vitals:   02/28/24 0030 02/28/24 0521  BP: (!) 103/54 113/75  Pulse: 79 88   Resp: 19 18  Temp: 97.8 F (36.6 C) 98.3 F (36.8 C)  SpO2: 100% 100%   Gen: Awake, Alert, Oriented X 3,  HEENT: no JVD Lungs: Good air movement bilaterally, CTAB CVS: S1S2/RRR Abd: soft, Non tender, non distended, BS present Extremities: No edema Skin: no new rashes on exposed skin   Discharge Instructions    Allergies as of 02/28/2024       Reactions   Naproxen Other (See Comments)   Nose bleeding        Medication List     STOP taking these medications    hydrochlorothiazide 25 MG tablet Commonly known as: HYDRODIURIL   ibuprofen 200 MG tablet Commonly known as: ADVIL   loratadine 10 MG tablet Commonly known as: CLARITIN   meloxicam 15 MG tablet Commonly known as: MOBIC   methocarbamol 500 MG tablet Commonly known as: ROBAXIN   Rybelsus 3 MG Tabs Generic drug: Semaglutide   SAXENDA Menlo   triamcinolone cream 0.5 % Commonly known as: KENALOG       TAKE these medications    apixaban 5 MG Tabs tablet Commonly known as: ELIQUIS Take 1 tablet (5 mg total) by mouth 2 (two) times daily.   ascorbic acid 100 MG tablet Commonly known as: VITAMIN C Take 1 tablet by mouth daily.   atorvastatin 20 MG tablet Commonly known as: LIPITOR Take 20 mg by mouth daily.   cholecalciferol 1000 units tablet Commonly known as: VITAMIN D Take 1,000 Units by mouth 2 (two) times daily.   cyanocobalamin 1000 MCG tablet Take by mouth.   esomeprazole 40 MG capsule Commonly known as: NEXIUM Take 40 mg by mouth daily. What changed: Another medication with the same name was removed. Continue taking this medication, and follow the directions you see here.   ferrous gluconate 324 MG tablet Commonly known as: FERGON Take by mouth.   FLEXERIL PO Take 10 mg by mouth daily as needed (muscle spasms).   fluticasone 50 MCG/ACT nasal spray Commonly known as: FLONASE Place 1 spray into both nostrils daily.   furosemide 20 MG tablet Commonly known as: Lasix Take 1  tablet (20 mg total) by mouth as needed (Increased swelling, weight gain of 3 LB in 1 day or 5 LB in 1 week).   midodrine 5 MG tablet Commonly known as: PROAMATINE Take 1 tablet (5 mg total) by mouth 2 (two) times daily with a meal.   polyethylene glycol 17 g packet Commonly known as: MIRALAX / GLYCOLAX Take 17 g by mouth daily as needed for mild constipation.   pregabalin 150 MG capsule Commonly known as: LYRICA Take 200 mg by mouth in the morning, at noon, and at bedtime.   sertraline 50 MG tablet Commonly known as: ZOLOFT Take 50 mg by mouth daily.   traMADol 50 MG tablet Commonly known as: ULTRAM Take 50 mg by mouth every 12 (twelve) hours as needed for moderate pain (pain score 4-6) or severe pain (pain score 7-10).       Allergies  Allergen Reactions   Naproxen Other (See Comments)    Nose bleeding    Follow-up Information     [facility]. Go in 11 day(s).   Specialty: Cardiology Why: Hospital follow  up 03/07/2024 @ 8:15 am PLEASE bring a current medication list to appointment FREE valet parking, Entrance C, off [employer] [facility] entrance Contact information: [address] [place]  [phone]                 The results of significant diagnostics from this hospitalization (including imaging, microbiology, ancillary and laboratory) are listed below for reference.    Significant Diagnostic Studies: CT FOOT RIGHT WO CONTRAST Result Date: 02/23/2024 CLINICAL DATA:  Foot pain and concern for fracture EXAM: CT OF THE RIGHT FOOT WITHOUT CONTRAST TECHNIQUE: Multidetector CT imaging of the right foot was performed according to the standard protocol. Multiplanar CT image reconstructions were also generated. RADIATION DOSE REDUCTION: This exam was performed according to the departmental dose-optimization program which includes automated exposure  control, adjustment of the mA and/or kV according to patient size and/or use of iterative reconstruction technique. COMPARISON:  Radiographs 02/16/2024 FINDINGS: Bones/Joint/Cartilage Oblique fracture the proximal phalanx fourth toe extends from the medial proximal metaphysis to the lateral distal metaphysis as on image 114 series 4. Plantar calcaneal spur. Ligaments Suboptimally assessed by CT. 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 Mildly thickened medial band of the plantar fascia, plantar fasciitis not excluded. Moderate regional muscular atrophy. Soft tissues Mild dorsal subcutaneous edema in the forefoot, nonspecific. IMPRESSION: 1. Acute oblique fracture of the proximal phalanx fourth toe extends from the medial proximal metaphysis to the lateral distal metaphysis. 2. 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 3. Mildly thickened medial band of the plantar fascia, plantar fasciitis not excluded. 4. Moderate regional muscular atrophy. 5. Mild dorsal subcutaneous edema in the forefoot, nonspecific. Electronically Signed   By: Ryan Salvage M.D.   On: 02/23/2024 15:09   ECHOCARDIOGRAM LIMITED Result Date: 02/23/2024    ECHOCARDIOGRAM LIMITED REPORT   Patient Name:   DOMINICA KENT Date of Exam: 02/23/2024 Medical Rec #:  [id number]   Height:       59.0 in Accession #:    [id number]  Weight:       273.3 lb Date of Birth:  1961/04/26    BSA:          2.106 m Patient Age:    63 years    BP:           91/58 mmHg Patient Gender: F           HR:           79 bpm. Exam Location:  Inpatient Procedure: Limited Echo and Intracardiac Opacification Agent (Both Spectral and            Color Flow Doppler were utilized during procedure). Indications:    Evaluate RV  History:        Patient has prior history of Echocardiogram examinations.                 Pulmonary Embolism; Signs/Symptoms:Shortness of Breath.   Sonographer:    Norleen Amour Referring Phys: [id number] Adalae Baysinger IMPRESSIONS  1. Left ventricular ejection fraction, by estimation, is 50 to 55%. The left ventricle has normal function. The left ventricle has no regional wall motion abnormalities. Left ventricular diastolic function could not be evaluated.  2. Peak RV-RA gradient 23 mmHg. Right ventricular systolic function is mildly reduced. The right ventricular size is mildly enlarged.  3. The mitral valve is normal in structure. No evidence of mitral valve  regurgitation. No evidence of mitral stenosis.  4. The aortic valve is tricuspid. There is moderate calcification of the aortic valve. Aortic valve regurgitation is not visualized. Aortic valve sclerosis/calcification is present, without any evidence of aortic stenosis.  5. IVC not visualized.  6. Limited echo for RV.  7. Technically difficult study with poor acoustic windows. FINDINGS  Left Ventricle: Left ventricular ejection fraction, by estimation, is 50 to 55%. The left ventricle has normal function. The left ventricle has no regional wall motion abnormalities. The left ventricular internal cavity size was normal in size. There is  no left ventricular hypertrophy. Left ventricular diastolic function could not be evaluated. Right Ventricle: Peak RV-RA gradient 23 mmHg. The right ventricular size is mildly enlarged. No increase in right ventricular wall thickness. Right ventricular systolic function is mildly reduced. Left Atrium: Left atrial size was normal in size. Right Atrium: Right atrial size was normal in size. Mitral Valve: The mitral valve is normal in structure. No evidence of mitral valve stenosis. Tricuspid Valve: The tricuspid valve is normal in structure. Tricuspid valve regurgitation is trivial. Aortic Valve: The aortic valve is tricuspid. There is moderate calcification of the aortic valve. Aortic valve regurgitation is not visualized. Aortic valve sclerosis/calcification is present,  without any evidence of aortic stenosis. Venous: IVC not visualized. IAS/Shunts: No atrial level shunt detected by color flow Doppler. LEFT VENTRICLE PLAX 2D LVIDd:         4.30 cm LVIDs:         2.80 cm LV PW:         0.90 cm LV IVS:        0.80 cm  LV Volumes (MOD) LV vol d, MOD A2C: 111.0 ml LV vol d, MOD A4C: 96.6 ml LV vol s, MOD A2C: 25.5 ml LV vol s, MOD A4C: 42.5 ml LV SV MOD A2C:     85.5 ml LV SV MOD A4C:     96.6 ml LV SV MOD BP:      75.6 ml RIGHT VENTRICLE RV Basal diam:  3.00 cm RV Mid diam:    3.00 cm RV FAC:         23.5 % RV S prime:     12.40 cm/s TAPSE (M-mode): 1.6 cm TRICUSPID VALVE TR Peak grad:   23.0 mmHg TR Vmax:        240.00 cm/s Dalton McleanMD Electronically signed by Ezra Kanner Signature Date/Time: 02/23/2024/2:21:28 PM    Final    ECHOCARDIOGRAM LIMITED Result Date: 02/17/2024    ECHOCARDIOGRAM LIMITED REPORT   Patient Name:   DAYSHA ASHMORE Date of Exam: 02/17/2024 Medical Rec #:  [id number]   Height:       59.0 in Accession #:    [id number]  Weight:       281.5 lb Date of Birth:  03-28-1961    BSA:          2.133 m Patient Age:    63 years    BP:           110/62 mmHg Patient Gender: F           HR:           89 bpm. Exam Location:  Inpatient Procedure: Limited Echo, Color Doppler, Cardiac Doppler and Intracardiac            Opacification Agent (Both Spectral and Color Flow Doppler were            utilized during procedure). Indications:  CHF  History:        Patient has prior history of Echocardiogram examinations. CHF.  Sonographer:    Norleen Amour Referring Phys: [phone] LINDSAY NICOLE FINCH IMPRESSIONS  1. Very poor acoustic windows limit study.  2. Left ventricular ejection fraction, by estimation, is 50 to 55%. The left ventricle has low normal function.  3. RV not seen well enough to assess function.. The right ventricular size is mildly enlarged. There is moderately elevated pulmonary artery systolic pressure.  4. The mitral valve is grossly normal. Mild mitral valve  regurgitation.  5. The aortic valve is tricuspid. Aortic valve regurgitation is not visualized.  6. The inferior vena cava is normal in size with <50% respiratory variability, suggesting right atrial pressure of 8 mmHg. FINDINGS  Left Ventricle: Left ventricular ejection fraction, by estimation, is 50 to 55%. The left ventricle has low normal function. There is no left ventricular hypertrophy. Right Ventricle: RV not seen well enough to assess function. The right ventricular size is mildly enlarged. There is moderately elevated pulmonary artery systolic pressure. The tricuspid regurgitant velocity is 3.30 m/s, and with an assumed right atrial pressure of 15 mmHg, the estimated right ventricular systolic pressure is 58.6 mmHg. Left Atrium: Left atrial size was normal in size. Right Atrium: Right atrial size was normal in size. Pericardium: There is no evidence of pericardial effusion. Mitral Valve: The mitral valve is grossly normal. Mild mitral valve regurgitation. Tricuspid Valve: The tricuspid valve is grossly normal. Tricuspid valve regurgitation is mild. Aortic Valve: The aortic valve is tricuspid. Aortic valve regurgitation is not visualized. Pulmonic Valve: The pulmonic valve was not well visualized. Aorta: Aortic root could not be assessed. Venous: The inferior vena cava is normal in size with less than 50% respiratory variability, suggesting right atrial pressure of 8 mmHg. LEFT VENTRICLE PLAX 2D LVIDd:         3.40 cm LVIDs:         2.10 cm LV PW:         0.90 cm LV IVS:        0.90 cm  LV Volumes (MOD) LV vol d, MOD A2C: 91.5 ml LV vol d, MOD A4C: 44.2 ml LV vol s, MOD A2C: 47.4 ml LV vol s, MOD A4C: 20.1 ml LV SV MOD A2C:     44.1 ml LV SV MOD A4C:     44.2 ml LV SV MOD BP:      32.8 ml RIGHT VENTRICLE            IVC RV S prime:     7.51 cm/s  IVC diam: 2.00 cm TAPSE (M-mode): 1.3 cm LEFT ATRIUM         Index LA diam:    3.00 cm 1.41 cm/m  TRICUSPID VALVE TV Peak grad:   39.2 mmHg TV Vmax:        3.13  m/s TR Peak grad:   43.6 mmHg TR Vmax:        330.00 cm/s Vina Gull MD Electronically signed by Vina Gull MD Signature Date/Time: 02/17/2024/5:19:45 PM    Final    VAS US  CAROTID Result Date: 02/16/2024 Carotid Arterial Duplex Study Patient Name:  AUNISTY REALI  Date of Exam:   02/16/2024 Medical Rec #: [id number]    Accession #:    [id number] Date of Birth: July 24, 1960     Patient Gender: F Patient Age:   51 years Exam Location:  [facility] Procedure:      VAS  US  CAROTID Referring Phys: BURGESS DARE --------------------------------------------------------------------------------  Indications:       Syncope. Risk Factors:      Hyperlipidemia. Other Factors:     Pulmonary embolism, hypotension, OSA on CPAP, Morbid obesity,                    BMI 56.86. Comparison Study:  No prior study on file Performing Technologist: Alberta Lis RVS  Examination Guidelines: A complete evaluation includes B-mode imaging, spectral Doppler, color Doppler, and power Doppler as needed of all accessible portions of each vessel. Bilateral testing is considered an integral part of a complete examination. Limited examinations for reoccurring indications may be performed as noted.  Right Carotid Findings: +----------+--------+--------+--------+------------------+------------------+           PSV cm/sEDV cm/sStenosisPlaque DescriptionComments           +----------+--------+--------+--------+------------------+------------------+ CCA Prox  134     27                                intimal thickening +----------+--------+--------+--------+------------------+------------------+ CCA Distal123     31                                intimal thickening +----------+--------+--------+--------+------------------+------------------+ ICA Prox  134     46                                                   +----------+--------+--------+--------+------------------+------------------+ ICA Mid   177     55                                                    +----------+--------+--------+--------+------------------+------------------+ ICA Distal140     36                                                   +----------+--------+--------+--------+------------------+------------------+ ECA       96      12                                                   +----------+--------+--------+--------+------------------+------------------+ +----------+--------+-------+--------+-------------------+           PSV cm/sEDV cmsDescribeArm Pressure (mmHG) +----------+--------+-------+--------+-------------------+ Subclavian105                                        +----------+--------+-------+--------+-------------------+ +---------+--------+--+--------+--+ VertebralPSV cm/s83EDV cm/s24 +---------+--------+--+--------+--+ Systolic and diastolic velocities elevated throughout the ICA, however, there is minimal plaque noted Left Carotid Findings: +----------+--------+--------+--------+------------------+------------------+           PSV cm/sEDV cm/sStenosisPlaque DescriptionComments           +----------+--------+--------+--------+------------------+------------------+ CCA Prox  158     39  intimal thickening +----------+--------+--------+--------+------------------+------------------+ CCA Distal107     36                                intimal thickening +----------+--------+--------+--------+------------------+------------------+ ICA Prox  143     51              homogeneous                          +----------+--------+--------+--------+------------------+------------------+ ICA Mid   144     53                                                   +----------+--------+--------+--------+------------------+------------------+ ICA Distal131     41                                                    +----------+--------+--------+--------+------------------+------------------+ ECA       154     11                                                   +----------+--------+--------+--------+------------------+------------------+ +----------+--------+--------+--------+-------------------+           PSV cm/sEDV cm/sDescribeArm Pressure (mmHG) +----------+--------+--------+--------+-------------------+ Subclavian115                                         +----------+--------+--------+--------+-------------------+ +---------+--------+--+--------+--+ VertebralPSV cm/s92EDV cm/s39 +---------+--------+--+--------+--+ Systolic and diastolic velocities elevated throughout the CCA and ICA, however, there is minimal plaque noted  Summary: Right Carotid: Systolic and diastolic velocities elevated throughout the ICA,                however, there is minimal plaque noted. Left Carotid: Systolic and diastolic velocities elevated throughout the CCA and               ICA, however, there is minimal plaque noted. Vertebrals:  Bilateral vertebral arteries demonstrate antegrade flow. Subclavians: Normal flow hemodynamics were seen in bilateral subclavian              arteries. *See table(s) above for measurements and observations.  Electronically signed by Debby Robertson on 02/16/2024 at 5:42:25 PM.    Final    VAS US  LOWER EXTREMITY VENOUS (DVT) Result Date: 02/16/2024  Lower Venous DVT Study Patient Name:  LINDA GRIMMER  Date of Exam:   02/16/2024 Medical Rec #: [id number]    Accession #:    [id number] Date of Birth: [birth year]     Patient Gender: F Patient Age:   51 years Exam Location:  [facility] Procedure:      VAS US  LOWER EXTREMITY VENOUS (DVT) Referring Phys: ADITYA PALIWAL --------------------------------------------------------------------------------  Indications: Sub massive pulmonary embolism, hypotension, and Syncope.  Limitations: Body habitus (BMI 56.86), tissue properties, patient somnolent  and on CPAP Comparison Study: No prior study on file Performing Technologist: Alberta Lis RVS  Examination Guidelines: A complete evaluation includes B-mode  imaging, spectral Doppler, color Doppler, and power Doppler as needed of all accessible portions of each vessel. Bilateral testing is considered an integral part of a complete examination. Limited examinations for reoccurring indications may be performed as noted. The reflux portion of the exam is performed with the patient in reverse Trendelenburg.  +---------+---------------+---------+-----------+----------+-------------------+ RIGHT    CompressibilityPhasicitySpontaneityPropertiesThrombus Aging      +---------+---------------+---------+-----------+----------+-------------------+ CFV                     No       No                   poorly visualized,                                                        however, vessel                                                           appear dilated with                                                       minimal color flow  +---------+---------------+---------+-----------+----------+-------------------+ SFJ                                                   Not well visualized +---------+---------------+---------+-----------+----------+-------------------+ FV Prox                                               Not well visualized +---------+---------------+---------+-----------+----------+-------------------+ FV Mid                  Yes      Yes                  patent by color and                                                       Doppler             +---------+---------------+---------+-----------+----------+-------------------+ FV Distal               Yes      No                   patent by color and  Doppler              +---------+---------------+---------+-----------+----------+-------------------+ PFV                                                   Not well visualized +---------+---------------+---------+-----------+----------+-------------------+ POP      Full           Yes      No                                       +---------+---------------+---------+-----------+----------+-------------------+ PTV                                                   Not well visualized +---------+---------------+---------+-----------+----------+-------------------+ PERO                                                  Not well visualized +---------+---------------+---------+-----------+----------+-------------------+   +---------+---------------+---------+-----------+----------+-------------------+ LEFT     CompressibilityPhasicitySpontaneityPropertiesThrombus Aging      +---------+---------------+---------+-----------+----------+-------------------+ CFV      Full           Yes      No                                       +---------+---------------+---------+-----------+----------+-------------------+ SFJ      Full                                                             +---------+---------------+---------+-----------+----------+-------------------+ FV Prox  Full                                                             +---------+---------------+---------+-----------+----------+-------------------+ FV Mid   Full                                                             +---------+---------------+---------+-----------+----------+-------------------+ FV DistalFull                                                             +---------+---------------+---------+-----------+----------+-------------------+ PFV      Full                                                             +---------+---------------+---------+-----------+----------+-------------------+  POP      Full           Yes      No                                       +---------+---------------+---------+-----------+----------+-------------------+ PTV      None                                         Acute               +---------+---------------+---------+-----------+----------+-------------------+ PERO                                                  Not well visualized +---------+---------------+---------+-----------+----------+-------------------+     Summary: RIGHT: - Findings consistent with probable acute deep vein thrombosis involving the right common femoral vein in this limited exam.   LEFT: - Findings consistent with acute deep vein thrombosis involving the left posterior tibial veins.   *See table(s) above for measurements and observations. Electronically signed by Debby Robertson on 02/16/2024 at 5:41:59 PM.    Final    DG Foot 2 Views Right Result Date: 02/16/2024 EXAM: 1 OR 2 VIEW(S) XRAY OF THE FOOT 02/16/2024 10:50:00 AM COMPARISON: 12/27/2023 CLINICAL HISTORY: Pain FINDINGS: BONES AND JOINTS: Healed fourth toe proximal phalanx fracture. No acute fracture. No joint dislocation. SOFT TISSUES: Mild diffuse soft tissue edema. IMPRESSION: 1. No acute osseous abnormality. 2. Mild diffuse soft tissue edema. Electronically signed by: Rockey Kilts MD 02/16/2024 02:58 PM EDT RP Workstation: [id number]   ECHOCARDIOGRAM COMPLETE Result Date: 02/15/2024    ECHOCARDIOGRAM REPORT   Patient Name:   IKESHA SILLER Date of Exam: 02/15/2024 Medical Rec #:  [id number]   Height:       59.0 in Accession #:    [id number]  Weight:       281.5 lb Date of Birth:  [birth year]    BSA:          2.133 m Patient Age:    63 years    BP:           127/84 mmHg Patient Gender: F           HR:           77 bpm. Exam Location:  Inpatient Procedure: 2D Echo, Pediatric Echo, Cardiac Doppler and Intracardiac            Opacification Agent (Both Spectral and Color Flow Doppler were            utilized during  procedure). Indications:    Cardiomyopathy-Unspecified I42.9  History:        Patient has no prior history of Echocardiogram examinations.                 Risk Factors:Hypertension.  Sonographer:    Jayson Gaskins Referring Phys: [id number] BURGESS BROCKS AMIN IMPRESSIONS  1. Left ventricular ejection fraction, by estimation, is 50 to 55%. The left ventricle has low normal function. The left ventricle demonstrates regional wall motion abnormalities (see scoring diagram/findings for description). Left ventricular diastolic  parameters are consistent with Grade I diastolic dysfunction (impaired relaxation). There is the interventricular septum  is flattened in diastole ('D' shaped left ventricle), consistent with right ventricular volume overload and the interventricular septum is flattened in systole and diastole, consistent with right ventricular pressure and volume overload.  2. McConnell's sign is present, consistent with acute (or acute on chronic) cor pulmonale. Right ventricular systolic function is severely reduced. The right ventricular size is severely enlarged. There is mildly elevated pulmonary artery systolic pressure. The estimated right ventricular systolic pressure is 42.0 mmHg.  3. Right atrial size was mild to moderately dilated.  4. The mitral valve was not well visualized. No evidence of mitral valve regurgitation.  5. Tricuspid valve regurgitation is moderate to severe.  6. The aortic valve is grossly normal. Aortic valve regurgitation is not visualized.  7. The inferior vena cava is dilated in size with <50% respiratory variability, suggesting right atrial pressure of 15 mmHg. FINDINGS  Left Ventricle: Left ventricular ejection fraction, by estimation, is 50 to 55%. The left ventricle has low normal function. The left ventricle demonstrates regional wall motion abnormalities. Definity contrast agent was given IV to delineate the left ventricular endocardial borders. The left ventricular internal cavity size  was normal in size. There is no left ventricular hypertrophy. The interventricular septum is flattened in diastole ('D' shaped left ventricle), consistent with right ventricular volume overload and the interventricular septum is flattened in systole and diastole, consistent with right ventricular pressure and volume overload. Left ventricular diastolic parameters are consistent with Grade I diastolic dysfunction (impaired relaxation). Normal left ventricular filling pressure. Right Ventricle: McConnell's sign is present, consistent with acute (or acute on chronic) cor pulmonale. The right ventricular size is severely enlarged. No increase in right ventricular wall thickness. Right ventricular systolic function is severely reduced. There is mildly elevated pulmonary artery systolic pressure. The tricuspid regurgitant velocity is 2.60 m/s, and with an assumed right atrial pressure of 15 mmHg, the estimated right ventricular systolic pressure is 42.0 mmHg. Left Atrium: Left atrial size was normal in size. Right Atrium: Right atrial size was mild to moderately dilated. Pericardium: There is no evidence of pericardial effusion. Mitral Valve: The mitral valve was not well visualized. No evidence of mitral valve regurgitation. Tricuspid Valve: The tricuspid valve is grossly normal. Tricuspid valve regurgitation is moderate to severe. Aortic Valve: The aortic valve is grossly normal. Aortic valve regurgitation is not visualized. Aortic valve mean gradient measures 4.0 mmHg. Aortic valve peak gradient measures 6.7 mmHg. Aortic valve area, by VTI measures 2.66 cm. Pulmonic Valve: The pulmonic valve was not well visualized. Pulmonic valve regurgitation is not visualized. Aorta: The ascending aorta was not well visualized and the aortic arch was not well visualized. Venous: The inferior vena cava is dilated in size with less than 50% respiratory variability, suggesting right atrial pressure of 15 mmHg. IAS/Shunts: No atrial  level shunt detected by color flow Doppler.  LEFT VENTRICLE PLAX 2D LVIDd:         3.70 cm   Diastology LVIDs:         2.70 cm   LV e' medial:    8.38 cm/s LV PW:         0.80 cm   LV E/e' medial:  5.4 LV IVS:        1.00 cm   LV e' lateral:   10.00 cm/s LVOT diam:     2.20 cm   LV E/e' lateral: 4.5 LV SV:         56 LV SV Index:   26 LVOT Area:  3.80 cm  IVC IVC diam: 2.60 cm LEFT ATRIUM           Index        RIGHT ATRIUM           Index LA Vol (A4C): 22.1 ml 10.36 ml/m  RA Area:     20.00 cm                                    RA Volume:   65.80 ml  30.85 ml/m  AORTIC VALVE AV Area (Vmax):    2.78 cm AV Area (Vmean):   2.71 cm AV Area (VTI):     2.66 cm AV Vmax:           129.00 cm/s AV Vmean:          92.600 cm/s AV VTI:            0.210 m AV Peak Grad:      6.7 mmHg AV Mean Grad:      4.0 mmHg LVOT Vmax:         94.20 cm/s LVOT Vmean:        66.000 cm/s LVOT VTI:          0.147 m LVOT/AV VTI ratio: 0.70 MITRAL VALVE               TRICUSPID VALVE MV Area (PHT): 3.12 cm    TR Peak grad:   27.0 mmHg MV Decel Time: 243 msec    TR Vmax:        260.00 cm/s MV E velocity: 45.10 cm/s MV A velocity: 70.20 cm/s  SHUNTS MV E/A ratio:  0.64        Systemic VTI:  0.15 m                            Systemic Diam: 2.20 cm Jerel Croitoru MD Electronically signed by Jerel Balding MD Signature Date/Time: 02/15/2024/2:05:57 PM    Final    US  EKG SITE RITE Result Date: 02/15/2024 If Site Rite image not attached, placement could not be confirmed due to current cardiac rhythm.  IR THROMBECT PRIM MECH INIT (INCLU) MOD SED Result Date: 02/15/2024 INDICATION: Large bilateral central pulmonary emboli with pulmonary arterial hypertension, RV strain, shortness of breath,hypotension. EXAM: BILATERAL PULMONARY ARTERIOGRAM LEFT PULMONARY ARTERIAL THROMBECTOMY RIGHT PULMONARY ARTERIAL THROMBECTOMY ULTRASOUND GUIDANCE FOR VASCULAR ACCESS RIGHT ILIAC VENOGRAM COMPARISON:  CTA chest  from earlier the same day MEDICATIONS:  Lidocaine 1% subcutaneous, heparin 5000 units ANESTHESIA/SEDATION: Intravenous Fentanyl 150mcg and Versed 2mg  were administered as conscious sedation during continuous monitoring of the patient's level of consciousness and physiological / cardiorespiratory status by the radiology RN, with a total moderate sedation time of 137 minutes. TECHNIQUE: Informed written consent was obtained from the patient after a thorough discussion of the procedural risks, benefits and alternatives. All questions were addressed. Maximal Sterile Barrier Technique was utilized including caps, mask, sterile gowns, sterile gloves, sterile drape, hand hygiene and skin antiseptic. A timeout was performed prior to the initiation of the procedure. Patency and complete compressibility of right common femoral vein was confirmed with ultrasound. After surgical prep, and local lidocaine administration, micropuncture access to right common femoral vein achieved. Outflow right iliac vein and inferior cavogram performed. Catheter exchanged over a Bentson wire for a 7 [place] vascular sheath. 6 French angled pigtail catheter advanced into the  right pulmonary artery. Pressure measurements obtained. Pulmonary arteriogram performed. Patient was heparinized with 5000 units heparin, and ACT was maintained greater than 200 . Catheter exchanged for a 5 French angled angiographic catheter, negotiated into lower lobe branch right pulmonary artery, removed over a stiff Amplatz wire. Vascular sheath was upsized, and 24 [place] FlowTriever device advanced into distal lower lobe segmental branch right pulmonary artery. Suction thrombectomy performed. Follow-up selective right pulmonary arteriography was obtained. The catheter was then redirected into the truncus anterior. Suction thrombectomy performed. Final pulmonary arteriogram was obtained. Catheter was then directed to the proximal left pulmonary artery for left pulmonary arteriography. The 5 [place] vert catheter  was coaxially advanced distally. FlowTriever device advanced into distal left pulmonary artery. Suction mechanical thrombectomy performed. Follow-up selective left pulmonary arteriogram obtained. A coaxial curved 20 French device was advanced for additional suction thrombectomy. Selective pulmonary artery was obtained intermittently to assess progress. This was then removed and the 24 French device redirected more distally into the left pulmonary artery for additional attempts at the suction thrombectomy, assisted by the FlowTriever disk. Catheter was retracted into the distal main pulmonary artery for final pulmonary arteriography and pressure measurements. Guidewire, catheter and sheath were removed and hemostasis achieved with aid of 0 Prolene pursestring suture. The patient tolerated the procedure well. FLUOROSCOPY TIME:  Radiation Exposure Index (as provided by the fluoroscopic device): 1035 mGy air Kerma COMPLICATIONS: None immediate. FINDINGS: Outflow right iliac venogram and inferior vena cavagram show no iliocaval thrombus or occlusion. Initial right pulmonary arterial pressure67/36  (47)mmHg. Selective right pulmonary arteriogram shows central partially occlusive embolus across its bifurcation with partially occlusive clot extending into the intralobar branch and lower lobe segmental branches. After right pulmonary arterial selective suction thrombectomy, there is clearance of the central clot with minimal residual partially occlusive clot in distal interlobar and lower lobe segmental branches. Selective left pulmonary arteriogram demonstrates near occlusive thrombus in the proximal left pulmonary artery extending through its length. After left pulmonary artery selective suction thrombectomy, partial clearance of thrombus from the left pulmonary artery with improved branch perfusion. Final pulmonary arterial pressure 64/36(45) mmHg. IMPRESSION: 1. Negative for right iliac or inferior vena cava thrombus. 2.  Bilateral central pulmonary emboli with markedly elevated pulmonary arterial pressures. 3. Partial response to bilateral selective pulmonary arterial suction thrombectomy, with significant residual central left thrombus suggesting significant chronic component. Minimal decrease of elevated pulmonary arterial pressures. Electronically Signed   By: JONETTA Faes M.D.   On: 02/15/2024 12:36   IR THROMBECT PRIM MECH ADD (INCLU) MOD SED Result Date: 02/15/2024 INDICATION: Large bilateral central pulmonary emboli with pulmonary arterial hypertension, RV strain, shortness of breath,hypotension. EXAM: BILATERAL PULMONARY ARTERIOGRAM LEFT PULMONARY ARTERIAL THROMBECTOMY RIGHT PULMONARY ARTERIAL THROMBECTOMY ULTRASOUND GUIDANCE FOR VASCULAR ACCESS RIGHT ILIAC VENOGRAM COMPARISON:  CTA chest  from earlier the same day MEDICATIONS: Lidocaine 1% subcutaneous, heparin 5000 units ANESTHESIA/SEDATION: Intravenous Fentanyl 150mcg and Versed 2mg  were administered as conscious sedation during continuous monitoring of the patient's level of consciousness and physiological / cardiorespiratory status by the radiology RN, with a total moderate sedation time of 137 minutes. TECHNIQUE: Informed written consent was obtained from the patient after a thorough discussion of the procedural risks, benefits and alternatives. All questions were addressed. Maximal Sterile Barrier Technique was utilized including caps, mask, sterile gowns, sterile gloves, sterile drape, hand hygiene and skin antiseptic. A timeout was performed prior to the initiation of the procedure. Patency and complete compressibility of right common femoral vein was confirmed with ultrasound. After surgical  prep, and local lidocaine administration, micropuncture access to right common femoral vein achieved. Outflow right iliac vein and inferior cavogram performed. Catheter exchanged over a Bentson wire for a 7 [place] vascular sheath. 6 French angled pigtail catheter advanced into  the right pulmonary artery. Pressure measurements obtained. Pulmonary arteriogram performed. Patient was heparinized with 5000 units heparin, and ACT was maintained greater than 200 . Catheter exchanged for a 5 French angled angiographic catheter, negotiated into lower lobe branch right pulmonary artery, removed over a stiff Amplatz wire. Vascular sheath was upsized, and 24 [place] FlowTriever device advanced into distal lower lobe segmental branch right pulmonary artery. Suction thrombectomy performed. Follow-up selective right pulmonary arteriography was obtained. The catheter was then redirected into the truncus anterior. Suction thrombectomy performed. Final pulmonary arteriogram was obtained. Catheter was then directed to the proximal left pulmonary artery for left pulmonary arteriography. The 5 [place] vert catheter was coaxially advanced distally. FlowTriever device advanced into distal left pulmonary artery. Suction mechanical thrombectomy performed. Follow-up selective left pulmonary arteriogram obtained. A coaxial curved 20 French device was advanced for additional suction thrombectomy. Selective pulmonary artery was obtained intermittently to assess progress. This was then removed and the 24 French device redirected more distally into the left pulmonary artery for additional attempts at the suction thrombectomy, assisted by the FlowTriever disk. Catheter was retracted into the distal main pulmonary artery for final pulmonary arteriography and pressure measurements. Guidewire, catheter and sheath were removed and hemostasis achieved with aid of 0 Prolene pursestring suture. The patient tolerated the procedure well. FLUOROSCOPY TIME:  Radiation Exposure Index (as provided by the fluoroscopic device): 1035 mGy air Kerma COMPLICATIONS: None immediate. FINDINGS: Outflow right iliac venogram and inferior vena cavagram show no iliocaval thrombus or occlusion. Initial right pulmonary arterial pressure67/36   (47)mmHg. Selective right pulmonary arteriogram shows central partially occlusive embolus across its bifurcation with partially occlusive clot extending into the intralobar branch and lower lobe segmental branches. After right pulmonary arterial selective suction thrombectomy, there is clearance of the central clot with minimal residual partially occlusive clot in distal interlobar and lower lobe segmental branches. Selective left pulmonary arteriogram demonstrates near occlusive thrombus in the proximal left pulmonary artery extending through its length. After left pulmonary artery selective suction thrombectomy, partial clearance of thrombus from the left pulmonary artery with improved branch perfusion. Final pulmonary arterial pressure 64/36(45) mmHg. IMPRESSION: 1. Negative for right iliac or inferior vena cava thrombus. 2. Bilateral central pulmonary emboli with markedly elevated pulmonary arterial pressures. 3. Partial response to bilateral selective pulmonary arterial suction thrombectomy, with significant residual central left thrombus suggesting significant chronic component. Minimal decrease of elevated pulmonary arterial pressures. Electronically Signed   By: JONETTA Faes M.D.   On: 02/15/2024 12:36   IR THROMBECT PRIM MECH ADD (INCLU) MOD SED Result Date: 02/15/2024 INDICATION: Large bilateral central pulmonary emboli with pulmonary arterial hypertension, RV strain, shortness of breath,hypotension. EXAM: BILATERAL PULMONARY ARTERIOGRAM LEFT PULMONARY ARTERIAL THROMBECTOMY RIGHT PULMONARY ARTERIAL THROMBECTOMY ULTRASOUND GUIDANCE FOR VASCULAR ACCESS RIGHT ILIAC VENOGRAM COMPARISON:  CTA chest  from earlier the same day MEDICATIONS: Lidocaine 1% subcutaneous, heparin 5000 units ANESTHESIA/SEDATION: Intravenous Fentanyl 150mcg and Versed 2mg  were administered as conscious sedation during continuous monitoring of the patient's level of consciousness and physiological / cardiorespiratory status by the  radiology RN, with a total moderate sedation time of 137 minutes. TECHNIQUE: Informed written consent was obtained from the patient after a thorough discussion of the procedural risks, benefits and alternatives. All questions were addressed. Maximal Sterile Barrier Technique  was utilized including caps, mask, sterile gowns, sterile gloves, sterile drape, hand hygiene and skin antiseptic. A timeout was performed prior to the initiation of the procedure. Patency and complete compressibility of right common femoral vein was confirmed with ultrasound. After surgical prep, and local lidocaine administration, micropuncture access to right common femoral vein achieved. Outflow right iliac vein and inferior cavogram performed. Catheter exchanged over a Bentson wire for a 7 [place] vascular sheath. 6 French angled pigtail catheter advanced into the right pulmonary artery. Pressure measurements obtained. Pulmonary arteriogram performed. Patient was heparinized with 5000 units heparin, and ACT was maintained greater than 200 . Catheter exchanged for a 5 French angled angiographic catheter, negotiated into lower lobe branch right pulmonary artery, removed over a stiff Amplatz wire. Vascular sheath was upsized, and 24 [place] FlowTriever device advanced into distal lower lobe segmental branch right pulmonary artery. Suction thrombectomy performed. Follow-up selective right pulmonary arteriography was obtained. The catheter was then redirected into the truncus anterior. Suction thrombectomy performed. Final pulmonary arteriogram was obtained. Catheter was then directed to the proximal left pulmonary artery for left pulmonary arteriography. The 5 [place] vert catheter was coaxially advanced distally. FlowTriever device advanced into distal left pulmonary artery. Suction mechanical thrombectomy performed. Follow-up selective left pulmonary arteriogram obtained. A coaxial curved 20 French device was advanced for additional suction  thrombectomy. Selective pulmonary artery was obtained intermittently to assess progress. This was then removed and the 24 French device redirected more distally into the left pulmonary artery for additional attempts at the suction thrombectomy, assisted by the FlowTriever disk. Catheter was retracted into the distal main pulmonary artery for final pulmonary arteriography and pressure measurements. Guidewire, catheter and sheath were removed and hemostasis achieved with aid of 0 Prolene pursestring suture. The patient tolerated the procedure well. FLUOROSCOPY TIME:  Radiation Exposure Index (as provided by the fluoroscopic device): 1035 mGy air Kerma COMPLICATIONS: None immediate. FINDINGS: Outflow right iliac venogram and inferior vena cavagram show no iliocaval thrombus or occlusion. Initial right pulmonary arterial pressure67/36  (47)mmHg. Selective right pulmonary arteriogram shows central partially occlusive embolus across its bifurcation with partially occlusive clot extending into the intralobar branch and lower lobe segmental branches. After right pulmonary arterial selective suction thrombectomy, there is clearance of the central clot with minimal residual partially occlusive clot in distal interlobar and lower lobe segmental branches. Selective left pulmonary arteriogram demonstrates near occlusive thrombus in the proximal left pulmonary artery extending through its length. After left pulmonary artery selective suction thrombectomy, partial clearance of thrombus from the left pulmonary artery with improved branch perfusion. Final pulmonary arterial pressure 64/36(45) mmHg. IMPRESSION: 1. Negative for right iliac or inferior vena cava thrombus. 2. Bilateral central pulmonary emboli with markedly elevated pulmonary arterial pressures. 3. Partial response to bilateral selective pulmonary arterial suction thrombectomy, with significant residual central left thrombus suggesting significant chronic component.  Minimal decrease of elevated pulmonary arterial pressures. Electronically Signed   By: JONETTA Faes M.D.   On: 02/15/2024 12:36   IR THROMBECT PRIM MECH ADD (INCLU) MOD SED Result Date: 02/15/2024 INDICATION: Large bilateral central pulmonary emboli with pulmonary arterial hypertension, RV strain, shortness of breath,hypotension. EXAM: BILATERAL PULMONARY ARTERIOGRAM LEFT PULMONARY ARTERIAL THROMBECTOMY RIGHT PULMONARY ARTERIAL THROMBECTOMY ULTRASOUND GUIDANCE FOR VASCULAR ACCESS RIGHT ILIAC VENOGRAM COMPARISON:  CTA chest  from earlier the same day MEDICATIONS: Lidocaine 1% subcutaneous, heparin 5000 units ANESTHESIA/SEDATION: Intravenous Fentanyl 150mcg and Versed 2mg  were administered as conscious sedation during continuous monitoring of the patient's level of consciousness and physiological / cardiorespiratory status  by the radiology RN, with a total moderate sedation time of 137 minutes. TECHNIQUE: Informed written consent was obtained from the patient after a thorough discussion of the procedural risks, benefits and alternatives. All questions were addressed. Maximal Sterile Barrier Technique was utilized including caps, mask, sterile gowns, sterile gloves, sterile drape, hand hygiene and skin antiseptic. A timeout was performed prior to the initiation of the procedure. Patency and complete compressibility of right common femoral vein was confirmed with ultrasound. After surgical prep, and local lidocaine administration, micropuncture access to right common femoral vein achieved. Outflow right iliac vein and inferior cavogram performed. Catheter exchanged over a Bentson wire for a 7 [place] vascular sheath. 6 French angled pigtail catheter advanced into the right pulmonary artery. Pressure measurements obtained. Pulmonary arteriogram performed. Patient was heparinized with 5000 units heparin, and ACT was maintained greater than 200 . Catheter exchanged for a 5 French angled angiographic catheter, negotiated  into lower lobe branch right pulmonary artery, removed over a stiff Amplatz wire. Vascular sheath was upsized, and 24 [place] FlowTriever device advanced into distal lower lobe segmental branch right pulmonary artery. Suction thrombectomy performed. Follow-up selective right pulmonary arteriography was obtained. The catheter was then redirected into the truncus anterior. Suction thrombectomy performed. Final pulmonary arteriogram was obtained. Catheter was then directed to the proximal left pulmonary artery for left pulmonary arteriography. The 5 [place] vert catheter was coaxially advanced distally. FlowTriever device advanced into distal left pulmonary artery. Suction mechanical thrombectomy performed. Follow-up selective left pulmonary arteriogram obtained. A coaxial curved 20 French device was advanced for additional suction thrombectomy. Selective pulmonary artery was obtained intermittently to assess progress. This was then removed and the 24 French device redirected more distally into the left pulmonary artery for additional attempts at the suction thrombectomy, assisted by the FlowTriever disk. Catheter was retracted into the distal main pulmonary artery for final pulmonary arteriography and pressure measurements. Guidewire, catheter and sheath were removed and hemostasis achieved with aid of 0 Prolene pursestring suture. The patient tolerated the procedure well. FLUOROSCOPY TIME:  Radiation Exposure Index (as provided by the fluoroscopic device): 1035 mGy air Kerma COMPLICATIONS: None immediate. FINDINGS: Outflow right iliac venogram and inferior vena cavagram show no iliocaval thrombus or occlusion. Initial right pulmonary arterial pressure67/36  (47)mmHg. Selective right pulmonary arteriogram shows central partially occlusive embolus across its bifurcation with partially occlusive clot extending into the intralobar branch and lower lobe segmental branches. After right pulmonary arterial selective suction  thrombectomy, there is clearance of the central clot with minimal residual partially occlusive clot in distal interlobar and lower lobe segmental branches. Selective left pulmonary arteriogram demonstrates near occlusive thrombus in the proximal left pulmonary artery extending through its length. After left pulmonary artery selective suction thrombectomy, partial clearance of thrombus from the left pulmonary artery with improved branch perfusion. Final pulmonary arterial pressure 64/36(45) mmHg. IMPRESSION: 1. Negative for right iliac or inferior vena cava thrombus. 2. Bilateral central pulmonary emboli with markedly elevated pulmonary arterial pressures. 3. Partial response to bilateral selective pulmonary arterial suction thrombectomy, with significant residual central left thrombus suggesting significant chronic component. Minimal decrease of elevated pulmonary arterial pressures. Electronically Signed   By: JONETTA Faes M.D.   On: 02/15/2024 12:36   IR US  Guide Vasc Access Right Result Date: 02/15/2024 INDICATION: Large bilateral central pulmonary emboli with pulmonary arterial hypertension, RV strain, shortness of breath,hypotension. EXAM: BILATERAL PULMONARY ARTERIOGRAM LEFT PULMONARY ARTERIAL THROMBECTOMY RIGHT PULMONARY ARTERIAL THROMBECTOMY ULTRASOUND GUIDANCE FOR VASCULAR ACCESS RIGHT ILIAC VENOGRAM COMPARISON:  CTA chest  from earlier the same day MEDICATIONS: Lidocaine 1% subcutaneous, heparin 5000 units ANESTHESIA/SEDATION: Intravenous Fentanyl 150mcg and Versed 2mg  were administered as conscious sedation during continuous monitoring of the patient's level of consciousness and physiological / cardiorespiratory status by the radiology RN, with a total moderate sedation time of 137 minutes. TECHNIQUE: Informed written consent was obtained from the patient after a thorough discussion of the procedural risks, benefits and alternatives. All questions were addressed. Maximal Sterile Barrier Technique was  utilized including caps, mask, sterile gowns, sterile gloves, sterile drape, hand hygiene and skin antiseptic. A timeout was performed prior to the initiation of the procedure. Patency and complete compressibility of right common femoral vein was confirmed with ultrasound. After surgical prep, and local lidocaine administration, micropuncture access to right common femoral vein achieved. Outflow right iliac vein and inferior cavogram performed. Catheter exchanged over a Bentson wire for a 7 [place] vascular sheath. 6 French angled pigtail catheter advanced into the right pulmonary artery. Pressure measurements obtained. Pulmonary arteriogram performed. Patient was heparinized with 5000 units heparin, and ACT was maintained greater than 200 . Catheter exchanged for a 5 French angled angiographic catheter, negotiated into lower lobe branch right pulmonary artery, removed over a stiff Amplatz wire. Vascular sheath was upsized, and 24 [place] FlowTriever device advanced into distal lower lobe segmental branch right pulmonary artery. Suction thrombectomy performed. Follow-up selective right pulmonary arteriography was obtained. The catheter was then redirected into the truncus anterior. Suction thrombectomy performed. Final pulmonary arteriogram was obtained. Catheter was then directed to the proximal left pulmonary artery for left pulmonary arteriography. The 5 [place] vert catheter was coaxially advanced distally. FlowTriever device advanced into distal left pulmonary artery. Suction mechanical thrombectomy performed. Follow-up selective left pulmonary arteriogram obtained. A coaxial curved 20 French device was advanced for additional suction thrombectomy. Selective pulmonary artery was obtained intermittently to assess progress. This was then removed and the 24 French device redirected more distally into the left pulmonary artery for additional attempts at the suction thrombectomy, assisted by the FlowTriever disk.  Catheter was retracted into the distal main pulmonary artery for final pulmonary arteriography and pressure measurements. Guidewire, catheter and sheath were removed and hemostasis achieved with aid of 0 Prolene pursestring suture. The patient tolerated the procedure well. FLUOROSCOPY TIME:  Radiation Exposure Index (as provided by the fluoroscopic device): 1035 mGy air Kerma COMPLICATIONS: None immediate. FINDINGS: Outflow right iliac venogram and inferior vena cavagram show no iliocaval thrombus or occlusion. Initial right pulmonary arterial pressure67/36  (47)mmHg. Selective right pulmonary arteriogram shows central partially occlusive embolus across its bifurcation with partially occlusive clot extending into the intralobar branch and lower lobe segmental branches. After right pulmonary arterial selective suction thrombectomy, there is clearance of the central clot with minimal residual partially occlusive clot in distal interlobar and lower lobe segmental branches. Selective left pulmonary arteriogram demonstrates near occlusive thrombus in the proximal left pulmonary artery extending through its length. After left pulmonary artery selective suction thrombectomy, partial clearance of thrombus from the left pulmonary artery with improved branch perfusion. Final pulmonary arterial pressure 64/36(45) mmHg. IMPRESSION: 1. Negative for right iliac or inferior vena cava thrombus. 2. Bilateral central pulmonary emboli with markedly elevated pulmonary arterial pressures. 3. Partial response to bilateral selective pulmonary arterial suction thrombectomy, with significant residual central left thrombus suggesting significant chronic component. Minimal decrease of elevated pulmonary arterial pressures. Electronically Signed   By: JONETTA Faes M.D.   On: 02/15/2024 12:36   IR Angiogram Pulmonary Bilateral Selective Result Date: 02/15/2024 INDICATION: Large  bilateral central pulmonary emboli with pulmonary arterial  hypertension, RV strain, shortness of breath,hypotension. EXAM: BILATERAL PULMONARY ARTERIOGRAM LEFT PULMONARY ARTERIAL THROMBECTOMY RIGHT PULMONARY ARTERIAL THROMBECTOMY ULTRASOUND GUIDANCE FOR VASCULAR ACCESS RIGHT ILIAC VENOGRAM COMPARISON:  CTA chest  from earlier the same day MEDICATIONS: Lidocaine 1% subcutaneous, heparin 5000 units ANESTHESIA/SEDATION: Intravenous Fentanyl 150mcg and Versed 2mg  were administered as conscious sedation during continuous monitoring of the patient's level of consciousness and physiological / cardiorespiratory status by the radiology RN, with a total moderate sedation time of 137 minutes. TECHNIQUE: Informed written consent was obtained from the patient after a thorough discussion of the procedural risks, benefits and alternatives. All questions were addressed. Maximal Sterile Barrier Technique was utilized including caps, mask, sterile gowns, sterile gloves, sterile drape, hand hygiene and skin antiseptic. A timeout was performed prior to the initiation of the procedure. Patency and complete compressibility of right common femoral vein was confirmed with ultrasound. After surgical prep, and local lidocaine administration, micropuncture access to right common femoral vein achieved. Outflow right iliac vein and inferior cavogram performed. Catheter exchanged over a Bentson wire for a 7 [place] vascular sheath. 6 French angled pigtail catheter advanced into the right pulmonary artery. Pressure measurements obtained. Pulmonary arteriogram performed. Patient was heparinized with 5000 units heparin, and ACT was maintained greater than 200 . Catheter exchanged for a 5 French angled angiographic catheter, negotiated into lower lobe branch right pulmonary artery, removed over a stiff Amplatz wire. Vascular sheath was upsized, and 24 [place] FlowTriever device advanced into distal lower lobe segmental branch right pulmonary artery. Suction thrombectomy performed. Follow-up selective right  pulmonary arteriography was obtained. The catheter was then redirected into the truncus anterior. Suction thrombectomy performed. Final pulmonary arteriogram was obtained. Catheter was then directed to the proximal left pulmonary artery for left pulmonary arteriography. The 5 [place] vert catheter was coaxially advanced distally. FlowTriever device advanced into distal left pulmonary artery. Suction mechanical thrombectomy performed. Follow-up selective left pulmonary arteriogram obtained. A coaxial curved 20 French device was advanced for additional suction thrombectomy. Selective pulmonary artery was obtained intermittently to assess progress. This was then removed and the 24 French device redirected more distally into the left pulmonary artery for additional attempts at the suction thrombectomy, assisted by the FlowTriever disk. Catheter was retracted into the distal main pulmonary artery for final pulmonary arteriography and pressure measurements. Guidewire, catheter and sheath were removed and hemostasis achieved with aid of 0 Prolene pursestring suture. The patient tolerated the procedure well. FLUOROSCOPY TIME:  Radiation Exposure Index (as provided by the fluoroscopic device): 1035 mGy air Kerma COMPLICATIONS: None immediate. FINDINGS: Outflow right iliac venogram and inferior vena cavagram show no iliocaval thrombus or occlusion. Initial right pulmonary arterial pressure67/36  (47)mmHg. Selective right pulmonary arteriogram shows central partially occlusive embolus across its bifurcation with partially occlusive clot extending into the intralobar branch and lower lobe segmental branches. After right pulmonary arterial selective suction thrombectomy, there is clearance of the central clot with minimal residual partially occlusive clot in distal interlobar and lower lobe segmental branches. Selective left pulmonary arteriogram demonstrates near occlusive thrombus in the proximal left pulmonary artery extending  through its length. After left pulmonary artery selective suction thrombectomy, partial clearance of thrombus from the left pulmonary artery with improved branch perfusion. Final pulmonary arterial pressure 64/36(45) mmHg. IMPRESSION: 1. Negative for right iliac or inferior vena cava thrombus. 2. Bilateral central pulmonary emboli with markedly elevated pulmonary arterial pressures. 3. Partial response to bilateral selective pulmonary arterial suction thrombectomy, with significant residual central  left thrombus suggesting significant chronic component. Minimal decrease of elevated pulmonary arterial pressures. Electronically Signed   By: JONETTA Faes M.D.   On: 02/15/2024 12:36   IR Angiogram Selective Each Additional Vessel Result Date: 02/15/2024 INDICATION: Large bilateral central pulmonary emboli with pulmonary arterial hypertension, RV strain, shortness of breath,hypotension. EXAM: BILATERAL PULMONARY ARTERIOGRAM LEFT PULMONARY ARTERIAL THROMBECTOMY RIGHT PULMONARY ARTERIAL THROMBECTOMY ULTRASOUND GUIDANCE FOR VASCULAR ACCESS RIGHT ILIAC VENOGRAM COMPARISON:  CTA chest  from earlier the same day MEDICATIONS: Lidocaine 1% subcutaneous, heparin 5000 units ANESTHESIA/SEDATION: Intravenous Fentanyl 150mcg and Versed 2mg  were administered as conscious sedation during continuous monitoring of the patient's level of consciousness and physiological / cardiorespiratory status by the radiology RN, with a total moderate sedation time of 137 minutes. TECHNIQUE: Informed written consent was obtained from the patient after a thorough discussion of the procedural risks, benefits and alternatives. All questions were addressed. Maximal Sterile Barrier Technique was utilized including caps, mask, sterile gowns, sterile gloves, sterile drape, hand hygiene and skin antiseptic. A timeout was performed prior to the initiation of the procedure. Patency and complete compressibility of right common femoral vein was confirmed with  ultrasound. After surgical prep, and local lidocaine administration, micropuncture access to right common femoral vein achieved. Outflow right iliac vein and inferior cavogram performed. Catheter exchanged over a Bentson wire for a 7 [place] vascular sheath. 6 French angled pigtail catheter advanced into the right pulmonary artery. Pressure measurements obtained. Pulmonary arteriogram performed. Patient was heparinized with 5000 units heparin, and ACT was maintained greater than 200 . Catheter exchanged for a 5 French angled angiographic catheter, negotiated into lower lobe branch right pulmonary artery, removed over a stiff Amplatz wire. Vascular sheath was upsized, and 24 [place] FlowTriever device advanced into distal lower lobe segmental branch right pulmonary artery. Suction thrombectomy performed. Follow-up selective right pulmonary arteriography was obtained. The catheter was then redirected into the truncus anterior. Suction thrombectomy performed. Final pulmonary arteriogram was obtained. Catheter was then directed to the proximal left pulmonary artery for left pulmonary arteriography. The 5 [place] vert catheter was coaxially advanced distally. FlowTriever device advanced into distal left pulmonary artery. Suction mechanical thrombectomy performed. Follow-up selective left pulmonary arteriogram obtained. A coaxial curved 20 French device was advanced for additional suction thrombectomy. Selective pulmonary artery was obtained intermittently to assess progress. This was then removed and the 24 French device redirected more distally into the left pulmonary artery for additional attempts at the suction thrombectomy, assisted by the FlowTriever disk. Catheter was retracted into the distal main pulmonary artery for final pulmonary arteriography and pressure measurements. Guidewire, catheter and sheath were removed and hemostasis achieved with aid of 0 Prolene pursestring suture. The patient tolerated the procedure  well. FLUOROSCOPY TIME:  Radiation Exposure Index (as provided by the fluoroscopic device): 1035 mGy air Kerma COMPLICATIONS: None immediate. FINDINGS: Outflow right iliac venogram and inferior vena cavagram show no iliocaval thrombus or occlusion. Initial right pulmonary arterial pressure67/36  (47)mmHg. Selective right pulmonary arteriogram shows central partially occlusive embolus across its bifurcation with partially occlusive clot extending into the intralobar branch and lower lobe segmental branches. After right pulmonary arterial selective suction thrombectomy, there is clearance of the central clot with minimal residual partially occlusive clot in distal interlobar and lower lobe segmental branches. Selective left pulmonary arteriogram demonstrates near occlusive thrombus in the proximal left pulmonary artery extending through its length. After left pulmonary artery selective suction thrombectomy, partial clearance of thrombus from the left pulmonary artery with improved branch perfusion. Final pulmonary arterial  pressure 64/36(45) mmHg. IMPRESSION: 1. Negative for right iliac or inferior vena cava thrombus. 2. Bilateral central pulmonary emboli with markedly elevated pulmonary arterial pressures. 3. Partial response to bilateral selective pulmonary arterial suction thrombectomy, with significant residual central left thrombus suggesting significant chronic component. Minimal decrease of elevated pulmonary arterial pressures. Electronically Signed   By: JONETTA Faes M.D.   On: 02/15/2024 12:36   IR Angiogram Selective Each Additional Vessel Result Date: 02/15/2024 INDICATION: Large bilateral central pulmonary emboli with pulmonary arterial hypertension, RV strain, shortness of breath,hypotension. EXAM: BILATERAL PULMONARY ARTERIOGRAM LEFT PULMONARY ARTERIAL THROMBECTOMY RIGHT PULMONARY ARTERIAL THROMBECTOMY ULTRASOUND GUIDANCE FOR VASCULAR ACCESS RIGHT ILIAC VENOGRAM COMPARISON:  CTA chest  from earlier the  same day MEDICATIONS: Lidocaine 1% subcutaneous, heparin 5000 units ANESTHESIA/SEDATION: Intravenous Fentanyl 150mcg and Versed 2mg  were administered as conscious sedation during continuous monitoring of the patient's level of consciousness and physiological / cardiorespiratory status by the radiology RN, with a total moderate sedation time of 137 minutes. TECHNIQUE: Informed written consent was obtained from the patient after a thorough discussion of the procedural risks, benefits and alternatives. All questions were addressed. Maximal Sterile Barrier Technique was utilized including caps, mask, sterile gowns, sterile gloves, sterile drape, hand hygiene and skin antiseptic. A timeout was performed prior to the initiation of the procedure. Patency and complete compressibility of right common femoral vein was confirmed with ultrasound. After surgical prep, and local lidocaine administration, micropuncture access to right common femoral vein achieved. Outflow right iliac vein and inferior cavogram performed. Catheter exchanged over a Bentson wire for a 7 [place] vascular sheath. 6 French angled pigtail catheter advanced into the right pulmonary artery. Pressure measurements obtained. Pulmonary arteriogram performed. Patient was heparinized with 5000 units heparin, and ACT was maintained greater than 200 . Catheter exchanged for a 5 French angled angiographic catheter, negotiated into lower lobe branch right pulmonary artery, removed over a stiff Amplatz wire. Vascular sheath was upsized, and 24 [place] FlowTriever device advanced into distal lower lobe segmental branch right pulmonary artery. Suction thrombectomy performed. Follow-up selective right pulmonary arteriography was obtained. The catheter was then redirected into the truncus anterior. Suction thrombectomy performed. Final pulmonary arteriogram was obtained. Catheter was then directed to the proximal left pulmonary artery for left pulmonary arteriography. The  5 [place] vert catheter was coaxially advanced distally. FlowTriever device advanced into distal left pulmonary artery. Suction mechanical thrombectomy performed. Follow-up selective left pulmonary arteriogram obtained. A coaxial curved 20 French device was advanced for additional suction thrombectomy. Selective pulmonary artery was obtained intermittently to assess progress. This was then removed and the 24 French device redirected more distally into the left pulmonary artery for additional attempts at the suction thrombectomy, assisted by the FlowTriever disk. Catheter was retracted into the distal main pulmonary artery for final pulmonary arteriography and pressure measurements. Guidewire, catheter and sheath were removed and hemostasis achieved with aid of 0 Prolene pursestring suture. The patient tolerated the procedure well. FLUOROSCOPY TIME:  Radiation Exposure Index (as provided by the fluoroscopic device): 1035 mGy air Kerma COMPLICATIONS: None immediate. FINDINGS: Outflow right iliac venogram and inferior vena cavagram show no iliocaval thrombus or occlusion. Initial right pulmonary arterial pressure67/36  (47)mmHg. Selective right pulmonary arteriogram shows central partially occlusive embolus across its bifurcation with partially occlusive clot extending into the intralobar branch and lower lobe segmental branches. After right pulmonary arterial selective suction thrombectomy, there is clearance of the central clot with minimal residual partially occlusive clot in distal interlobar and lower lobe segmental branches. Selective left  pulmonary arteriogram demonstrates near occlusive thrombus in the proximal left pulmonary artery extending through its length. After left pulmonary artery selective suction thrombectomy, partial clearance of thrombus from the left pulmonary artery with improved branch perfusion. Final pulmonary arterial pressure 64/36(45) mmHg. IMPRESSION: 1. Negative for right iliac or inferior  vena cava thrombus. 2. Bilateral central pulmonary emboli with markedly elevated pulmonary arterial pressures. 3. Partial response to bilateral selective pulmonary arterial suction thrombectomy, with significant residual central left thrombus suggesting significant chronic component. Minimal decrease of elevated pulmonary arterial pressures. Electronically Signed   By: JONETTA Faes M.D.   On: 02/15/2024 12:36   CT Angio Chest Pulmonary Embolism (PE) W or WO Contrast Result Date: 02/15/2024 EXAM: CTA CHEST 02/15/2024 02:47:09 AM TECHNIQUE: CTA of the chest was performed without and with the administration of 75 mL of intravenous contrast (iohexol (OMNIPAQUE) 350 MG/ML injection). Multiplanar reformatted images are provided for review. MIP images are provided for review. Automated exposure control, iterative reconstruction, and/or weight based adjustment of the mA/kV was utilized to reduce the radiation dose to as low as reasonably achievable. COMPARISON: Comparison with same day chest radiograph. CLINICAL HISTORY: Syncope/presyncope, cerebrovascular cause suspected. FINDINGS: PULMONARY ARTERIES: Pulmonary arteries are adequately opacified for evaluation. Extensive bilateral pulmonary emboli beginning in the distal right and left main pulmonary arteries and extending into the lobar and segmental branches. MEDIASTINUM: The heart demonstrates flattening of the interventricular septum with dilation of the right ventricle compatible with right heart strain (RV/LV ratio =2). There is no acute abnormality of the thoracic aorta. LYMPH NODES: No mediastinal, hilar or axillary lymphadenopathy. LUNGS AND PLEURA: There are a few scattered ground-glass opacities in the right lower lung favored due to hypoventilation. No evidence of pleural effusion or pneumothorax. UPPER ABDOMEN: Limited images of the upper abdomen are unremarkable. SOFT TISSUES AND BONES: No acute bone or soft tissue abnormality. IMPRESSION: 1. Extensive  bilateral pulmonary emboli extending from the distal right and left main pulmonary arteries into lobar and segmental branches with significant right heart strain. RV/LV ratio = 2 CRITICAL VALUE/EMERGENT RESULTS WERE CALLED BY TELEPHONE AT THE TIME OF INTERPRETATION ON 02/15/2024 at 2:59 am TO PROVIDER Dr. Haze, WHO VERBALLY ACKNOWLEDGED THESE RESULTS. Electronically signed by: Norman Gatlin MD 02/15/2024 02:59 AM EDT RP Workstation: [id number]   EEG adult Result Date: 02/14/2024 Shelton Arlin KIDD, MD     02/14/2024  4:52 PM Patient Name: Carron Mcmurry MRN: [id number] Epilepsy Attending: Arlin KIDD Shelton Referring Physician/Provider: Caleen Burgess BROCKS, MD Date: 02/14/2024 Duration: 21.57 mins Patient history: 63yo F with syncope. EEG to evaluate for seizure Level of alertness: Awake AEDs during EEG study: PGB Technical aspects: This EEG study was done with scalp electrodes positioned according to the 10-20 International system of electrode placement. Electrical activity was reviewed with band pass filter of 1-70Hz , sensitivity of 7 uV/mm, display speed of 37mm/sec with a 60Hz  notched filter applied as appropriate. EEG data were recorded continuously and digitally stored.  Video monitoring was available and reviewed as appropriate. Description: The posterior dominant rhythm consists of 8-9 Hz activity of moderate voltage (25-35 uV) seen predominantly in posterior head regions, symmetric and reactive to eye opening and eye closing. Hyperventilation and photic stimulation were not performed.   IMPRESSION: This study is within normal limits. No seizures or epileptiform discharges were seen throughout the recording. A normal interictal EEG does not exclude the diagnosis of epilepsy. Priyanka O Yadav   CT Head Wo Contrast Result Date: 02/14/2024 CLINICAL DATA:  Fall, altered mental status  EXAM: CT HEAD WITHOUT CONTRAST TECHNIQUE: Contiguous axial images were obtained from the base of the skull through the vertex without  intravenous contrast. RADIATION DOSE REDUCTION: This exam was performed according to the departmental dose-optimization program which includes automated exposure control, adjustment of the mA and/or kV according to patient size and/or use of iterative reconstruction technique. COMPARISON:  None Available. FINDINGS: Brain: Gray-white matter differentiation is preserved. Nonspecific mild periventricular and subcortical white matter hypodensities, likely secondary to chronic microvascular disease. No acute intracranial hemorrhage. No hydrocephalus. No midline shift. Basal cisterns are patent. Vascular: Unremarkable. Skull: No acute findings. Sinuses/Orbits: No acute finding. Other: None. IMPRESSION: No acute intracranial pathology. Electronically Signed   By: Michaeline Blanch M.D.   On: 02/14/2024 12:04   DG Chest Port 1 View Result Date: 02/14/2024 CLINICAL DATA:  Hypotensive, syncope, fall EXAM: PORTABLE CHEST 1 VIEW COMPARISON:  December 14, 2020 FINDINGS: Low lung volumes and slight patient rotation, limiting evaluation. No definite consolidations. No pleural effusions. No pneumothorax. Prominent cardiomediastinal silhouette, likely exaggerated by low lung volumes and AP technique. No acute osseous findings. IMPRESSION: Limited examination.  Hypoinflation.  No definite acute findings. Electronically Signed   By: Michaeline Blanch M.D.   On: 02/14/2024 12:01    Microbiology: No results found for this or any previous visit (from the past 240 hours).   Labs: Basic Metabolic Panel: Recent Labs  Lab 02/22/24 0817 02/23/24 0407 02/24/24 0450 02/25/24 0453 02/26/24 0352  NA 139 138 137 139 139  K 4.5 3.9 4.2 4.3 4.1  CL 98 101 101 103 104  CO2 29 28 25 27 25   GLUCOSE 112* 98 77 98 89  BUN 18 17 16 15 14   CREATININE 1.36* 1.39* 1.28* 1.20* 1.15*  CALCIUM 9.8 9.5 9.4 9.7 9.4  MG 1.8  --   --   --   --    Liver Function Tests: No results for input(s): AST, ALT, ALKPHOS, BILITOT, PROT, ALBUMIN in  the last 168 hours. No results for input(s): LIPASE, AMYLASE in the last 168 hours. No results for input(s): AMMONIA in the last 168 hours. CBC: Recent Labs  Lab 02/23/24 0407 02/24/24 0450 02/25/24 0453 02/26/24 0352 02/27/24 0458  WBC 6.5 6.6 5.3 5.3 4.8  HGB 8.8* 8.8* 8.4* 7.9* 8.9*  HCT 27.9* 28.9* 26.8* 25.6* 27.9*  MCV 88.9 92.3 90.8 90.1 91.2  PLT 234 221 256 266 265   Cardiac Enzymes: No results for input(s): CKTOTAL, CKMB, CKMBINDEX, TROPONINI in the last 168 hours. BNP: BNP (last 3 results) No results for input(s): BNP in the last 8760 hours.  ProBNP (last 3 results) No results for input(s): PROBNP in the last 8760 hours.  CBG: No results for input(s): GLUCAP in the last 168 hours.     Signed:  Sigurd Pac MD.  [facility] 02/28/2024, 10:07 AM

## 2024-02-27 NOTE — Progress Notes (Signed)
 Occupational Therapy Treatment Patient Details Name: Breanna Riley MRN: [id number] DOB: [birth year] Today's Date: 02/27/2024   History of present illness 63 y.o. female admitted 10/1 with Bilateral Pulmonary Embolism with Cor Pulmonale  - presented with syncope. Bil DVT as well. 10/1 bleeding from femoral access site after sutures removed, required re-suturing and femstop.  PMH: obesity, OSA, HLD, osteoarthritis, and spinal stenosis s/p fusions.   OT comments  Pt progressing toward goals, in bathroom upon arrival, needs supervision for standing pericare and grooming tasks. Pt able perform simulated shower transfer without AD with supervision and minimal use of external (wall) support. Pt able to ambulate hall distance with supervision using RW with x2 seated rest breaks. Pt with good understanding of activity pacing, continued education on energy conservation strategies for home. Patient will benefit from intensive inpatient follow-up therapy, >3 hours/day to maximize safety/ind with ADLs/functional mobility.       If plan is discharge home, recommend the following:  A little help with walking and/or transfers;A lot of help with bathing/dressing/bathroom;Assistance with cooking/housework;Direct supervision/assist for medications management;Direct supervision/assist for financial management;Assist for transportation;Help with stairs or ramp for entrance   Equipment Recommendations  Other (comment) (rollator)    Recommendations for Other Services PT consult    Precautions / Restrictions Precautions Precautions: Fall Recall of Precautions/Restrictions: Intact Precaution/Restrictions Comments: soft BP Restrictions Weight Bearing Restrictions Per Provider Order: No Other Position/Activity Restrictions: RLE WBAT in postop shoe       Mobility Bed Mobility               General bed mobility comments: OOB in bathroom upon arrival    Transfers Overall transfer level: Needs  assistance Equipment used: Rollator (4 wheels), Rolling walker (2 wheels) Transfers: Sit to/from Stand Sit to Stand: Supervision                 Balance Overall balance assessment: Mild deficits observed, not formally tested                                         ADL either performed or assessed with clinical judgement   ADL Overall ADL's : Needs assistance/impaired     Grooming: Wash/dry face;Oral care;Wash/dry hands;Set up;Standing               Lower Body Dressing: Minimal assistance;Sitting/lateral leans Lower Body Dressing Details (indicate cue type and reason): velcro strapping RLE postop shoe Toilet Transfer: Supervision/safety;Ambulation   Toileting- Clothing Manipulation and Hygiene: Supervision/safety;Sitting/lateral lean Toileting - Clothing Manipulation Details (indicate cue type and reason): pericare standing Tub/ Shower Transfer: Supervision/safety;Shower seat   Functional mobility during ADLs: Supervision/safety;Rolling walker (2 wheels);Rollator (4 wheels)      Extremity/Trunk Assessment Upper Extremity Assessment Upper Extremity Assessment: Generalized weakness   Lower Extremity Assessment Lower Extremity Assessment: Defer to PT evaluation        Vision   Vision Assessment?: No apparent visual deficits   Perception Perception Perception: Not tested   Praxis Praxis Praxis: Not tested   Communication Communication Communication: No apparent difficulties   Cognition Arousal: Alert Behavior During Therapy: WFL for tasks assessed/performed Cognition: No apparent impairments                               Following commands: Intact        Cueing   Cueing Techniques: Verbal cues  Exercises      Shoulder Instructions       General Comments      Pertinent Vitals/ Pain       Pain Assessment Pain Assessment: No/denies pain  Home Living                                           Prior Functioning/Environment              Frequency  Min 2X/week        Progress Toward Goals  OT Goals(current goals can now be found in the care plan section)     Acute Rehab OT Goals Patient Stated Goal: none stated OT Goal Formulation: With patient Time For Goal Achievement: 03/03/24 Potential to Achieve Goals: Good  Plan      Co-evaluation                 AM-PAC OT 6 Clicks Daily Activity     Outcome Measure   Help from another person eating meals?: None Help from another person taking care of personal grooming?: A Little Help from another person toileting, which includes using toliet, bedpan, or urinal?: A Little Help from another person bathing (including washing, rinsing, drying)?: A Lot Help from another person to put on and taking off regular upper body clothing?: A Little Help from another person to put on and taking off regular lower body clothing?: A Little 6 Click Score: 18    End of Session Equipment Utilized During Treatment: Rolling walker (2 wheels)  OT Visit Diagnosis: Unsteadiness on feet (R26.81);Other abnormalities of gait and mobility (R26.89);Muscle weakness (generalized) (M62.81)   Activity Tolerance Patient tolerated treatment well   Patient Left with call bell/phone within reach;in chair;with nursing/sitter in room;with chair alarm set   Nurse Communication Mobility status        Time: 9191-9155 OT Time Calculation (min): 36 min  Charges: OT General Charges $OT Visit: 1 Visit OT Treatments $Self Care/Home Management : 8-22 mins $Therapeutic Activity: 8-22 mins  Koleson Reifsteck K, OTD, OTR/L SecureChat Preferred Acute Rehab [phone]   Neenah Canter K Koonce 02/27/2024, 11:24 AM

## 2024-02-27 NOTE — Progress Notes (Signed)
 HEART & VASCULAR TRANSITION OF CARE CONSULT NOTE     Referring Physician: Dr. Fairy PCP: Seabron Lenis, MD   Chief Complaint: Recent Bilateral Pulmonary Embolism with Cor Pulmonale  HPI: Referred to clinic by Dr. Fairy for heart failure consultation.   Breanna Riley is a 63 y.o. female with history of obesity, OSA, HLD, osteoarthritis, and spinal stenosis s/p fusions.    No prior cardiac history. Father died of MI in his 38s.    She presented to [facility] 9/30 after three syncopal events, two with family and 1 with EMS. She was hypotensive and was given fluids and started on Epi prior to arrival. She had been having lightheadedness for 2 day prior, otherwise has been of normal state of health. On arrival to ED code sepsis was called, given 5L IVF. Cardiology was consult for elevated troponins. She developed orthostasis with another syncopal event and was briefly unresponsive with low BP and given another 1L IVF. She was transferred to [facility] for hypotension and levophed gtt started. She underwent CTPE which was positive for extensive bilateral PE with RV strain. She was urgently taken for thrombectomy and started on heparin. Echo 10/1: EF 50-55% with RV strain and severely reduced RV, RVSP 42. Venous dopplers w/ b/l lower extremity DVT. Repeat echo 10/3 with EF 55%. NE weaned off. Stable at discharge.   Today she presents for [facility] clinic visit with her son. Overall feeling ok just very tired as she has been trying to do more at home. Denies palpitations, CP, dizziness, or PND/Orthopnea. Some non pitting edema in BLE. Denies SOB. Appetite ok, tries to watch what she eats. Weight at home 280 pounds. Taking all medications.  Has been using stationary peddler at home 30 min BID. Has taken lasix 3 times since discharge. Drinks ~40-50 oz/day. SBP at home 137-131/ 80s-70s. Denies ETOH, tobacco or drug use.   Past Medical History:  Diagnosis Date   Hyperlipidemia    Knee pain     Overweight    Reflux     Current Outpatient Medications  Medication Sig Dispense Refill   apixaban (ELIQUIS) 5 MG TABS tablet Take 1 tablet (5 mg total) by mouth 2 (two) times daily. 60 tablet 1   atorvastatin (LIPITOR) 20 MG tablet Take 20 mg by mouth daily.     cholecalciferol (VITAMIN D) 1000 UNITS tablet Take 1,000 Units by mouth 2 (two) times daily. (Patient taking differently: Take 2,000 Units by mouth daily.)     Cyclobenzaprine HCl (FLEXERIL PO) Take 10 mg by mouth daily as needed (muscle spasms).     esomeprazole (NEXIUM) 40 MG capsule Take 40 mg by mouth daily.     ferrous gluconate (FERGON) 324 MG tablet Take by mouth.     fluticasone (FLONASE) 50 MCG/ACT nasal spray Place 1 spray into both nostrils daily. (Patient taking differently: Place 1 spray into both nostrils as needed for allergies.)     furosemide (LASIX) 20 MG tablet Take 1 tablet (20 mg total) by mouth as needed (Increased swelling, weight gain of 3 LB in 1 day or 5 LB in 1 week). 30 tablet 0   midodrine (PROAMATINE) 5 MG tablet Take 1 tablet (5 mg total) by mouth 2 (two) times daily with a meal. 60 tablet 0   polyethylene glycol (MIRALAX / GLYCOLAX) 17 g packet Take 17 g by mouth daily as needed for mild constipation.     pregabalin (LYRICA) 150 MG capsule Take 200 mg by mouth  in the morning, at noon, and at bedtime.     sertraline (ZOLOFT) 50 MG tablet Take 50 mg by mouth daily.     traMADol (ULTRAM) 50 MG tablet Take 50 mg by mouth every 12 (twelve) hours as needed for moderate pain (pain score 4-6) or severe pain (pain score 7-10).     ascorbic acid (VITAMIN C) 100 MG tablet Take 1 tablet by mouth daily. (Patient not taking: Reported on 03/07/2024)     cyanocobalamin 1000 MCG tablet Take by mouth. (Patient not taking: Reported on 03/07/2024)     No current facility-administered medications for this encounter.    Allergies  Allergen Reactions   Naproxen Other (See Comments)    Nose bleeding      Social  History   Socioeconomic History   Marital status: Single    Spouse name: Not on file   Number of children: Not on file   Years of education: Not on file   Highest education level: Not on file  Occupational History   Not on file  Tobacco Use   Smoking status: Never   Smokeless tobacco: Never  Substance and Sexual Activity   Alcohol use: Not Currently   Drug use: Never   Sexual activity: Not on file  Other Topics Concern   Not on file  Social History Narrative   Not on file   Social Drivers of Health   Financial Resource Strain: Low Risk  (09/19/2023)   Received from [facility]   Overall Financial Resource Strain (CARDIA)    Difficulty of Paying Living Expenses: Not hard at all  Food Insecurity: No Food Insecurity (02/14/2024)   Hunger Vital Sign    Worried About Running Out of Food in the Last Year: Never true    Ran Out of Food in the Last Year: Never true  Transportation Needs: No Transportation Needs (02/14/2024)   PRAPARE - Administrator, Civil Service (Medical): No    Lack of Transportation (Non-Medical): No  Physical Activity: Not on file  Stress: No Stress Concern Present (12/12/2023)   Received from [facility] - Occupational Stress Questionnaire    Do you feel stress - tense, restless, nervous, or anxious, or unable to sleep at night because your mind is troubled all the time - these days?: Only a little  Social Connections: Moderately Integrated (02/14/2024)   Social Connection and Isolation Panel    Frequency of Communication with Friends and Family: More than three times a week    Frequency of Social Gatherings with Friends and Family: More than three times a week    Attends Religious Services: More than 4 times per year    Active Member of [employer] or Organizations: Yes    Attends Banker Meetings: More than 4 times per year    Marital Status: Never married  Intimate Partner Violence: Not At  Risk (02/14/2024)   Humiliation, Afraid, Rape, and Kick questionnaire    Fear of Current or Ex-Partner: No    Emotionally Abused: No    Physically Abused: No    Sexually Abused: No     History reviewed. No pertinent family history.  Vitals:   03/07/24 0843  BP: 128/70  Pulse: 71  SpO2: 96%  Weight: 127.6 kg (281 lb 6.4 oz)  Height: 4' 11 (1.499 m)    PHYSICAL EXAM: General:  well appearing.  No respiratory difficulty.  Neck: JVD ~8 cm.  Cor: Regular rate &  rhythm. No murmurs. Lungs: clear Extremities: +1 ankle edema  Neuro: alert & oriented x 3. Affect pleasant.   Wt Readings from Last 3 Encounters:  03/07/24 127.6 kg (281 lb 6.4 oz)  02/28/24 125.7 kg (277 lb 3.2 oz)    ECG: none today  ASSESSMENT & PLAN: Recent Bilateral Pulmonary Embolism with Cor Pulmonale - presented with syncope and lactic acidosis  - CTPE + for PE with significant RV strain s/p thrombectomy  - echo 10/1: EF 50-55% with RV strain and severely reduced RV, RVSP 42 - Echo 10/3 EF 55% RV mildly HK  - Echo 02/23/24: EF 50-55%, no RWMA - Venous dopplers w/ b/l lower extremity DVT - Continue apixaban 5 mg BID - RV failure improving.  - Now on lasix PRN. Asked her to take 40 mg x2 days then back to 20 mg daily PRN.  - I asked her to keep a log of her BP and weights for next visit.  - BMET/BNP today.    Hypotension -  resolved. SBP 130s at home.  - Decrease midodrine 5>2.5 bid. Plan to wean off.    HLD - LDL 5/25 84. Continue atorva 20 mg daily   OSA - Continue home CPAP   Obesity - Body mass index is 56.84 kg/m.  - Previously PCP had been working on GLP1, now concerned with increased blood clot risks associated with GLP-1s. Holding off at this time.  - [facility]   Referred to HFSW (PCP, Medications, Transportation, ETOH Abuse, Drug Abuse, Insurance, Financial ): No Refer to Pharmacy: No Refer to Home Health:  No Refer to Advanced Heart Failure Clinic: Yes or no   Refer to General Cardiology: Yes   Follow up in 4-6 weeks in TOC to follow up on fluid and stop midodrine.

## 2024-02-27 NOTE — Progress Notes (Signed)
 Mobility Specialist Progress Note:   02/27/24 1100  Mobility  Activity Ambulated with assistance  Level of Assistance Standby assist, set-up cues, supervision of patient - no hands on  Assistive Device Four wheel walker  Distance Ambulated (ft) 300 ft  Activity Response Tolerated well  Mobility Referral Yes  Mobility visit 1 Mobility  Mobility Specialist Start Time (ACUTE ONLY) 1100  Mobility Specialist Stop Time (ACUTE ONLY) 1115  Mobility Specialist Time Calculation (min) (ACUTE ONLY) 15 min   Pt agreeable to mobility session. Required no physical assistance throughout ambulation with rollator. X3 seated rest breaks taken throughout. C/o tail bone pain d/t sitting. Pt back in chair with all needs met.   Therisa Rana Mobility Specialist Please contact via SecureChat or  Rehab office at [phone]

## 2024-02-28 ENCOUNTER — Other Ambulatory Visit (HOSPITAL_COMMUNITY): Payer: Self-pay

## 2024-02-28 DIAGNOSIS — I2609 Other pulmonary embolism with acute cor pulmonale: Secondary | ICD-10-CM | POA: Diagnosis not present

## 2024-02-28 MED ORDER — FUROSEMIDE 20 MG PO TABS
20.0000 mg | ORAL_TABLET | ORAL | 0 refills | Status: DC | PRN
  Filled 2024-02-28: qty 30, 30d supply, fill #0

## 2024-02-28 NOTE — Progress Notes (Signed)
 Pt seen and examined, no changes from my discharge summary yesterday, awaiting insurance appeal, ToC following  Sigurd Pac, MD

## 2024-02-28 NOTE — TOC Progression Note (Signed)
 Transition of Care [facility]) - Progression Note    Patient Details  Name: Breanna Riley MRN: [id number] Date of Birth: Oct 04, 1960  Transition of [facility]) CM/SW Contact  Graves-Bigelow, Erminio Deems, RN Phone Number: 02/28/2024, 1:00 PM  Clinical Narrative:  Patient will transition home with [facility] Service. Inpatient Case Manager received notification that the fast appeal has been denied. Inpatient Case Manger spoke with patient regarding home health services and patient has used [facility] in the past. Referral submitted to [facility]; referral received and start of care to begin within 24-48 hours post discharge. DME Rollator ordered via [employer] and will be delivered to the room. Son will take the patient home via private vehicle. No further needs identified at this time.    Expected Discharge Plan: Home/Self Care Barriers to Discharge: No Barriers Identified  Expected Discharge Plan and Services   Discharge Planning Services: CM Consult Post Acute Care Choice: Home Health Living arrangements for the past 2 months: Single Family Home Expected Discharge Date: 02/28/24               DME Arranged: Vannie rolling with seat DME Agency: [employer] Date DME Agency Contacted: 02/28/24 Time DME Agency Contacted: 1240 Representative spoke with at DME Agency: [place] HH Arranged: PT, Nurse's Aide HH Agency: [facility] Date [facility] Agency Contacted: 02/28/24 Time HH Agency Contacted: [phone] Representative spoke with at [facility] Agency: Darleene  Social Drivers of Health (SDOH) Interventions SDOH Screenings   Food Insecurity: No Food Insecurity (02/14/2024)  Housing: Low Risk  (02/14/2024)  Transportation Needs: No Transportation Needs (02/14/2024)  Utilities: Not At Risk (02/14/2024)  Financial Resource Strain: Low Risk  (09/19/2023)   Received from [facility]  Social Connections: Moderately Integrated (02/14/2024)  Stress: No Stress Concern Present (12/12/2023)   Received from [facility]  Tobacco Use:  Unknown (02/14/2024)   Readmission Risk Interventions     No data to display

## 2024-02-28 NOTE — Plan of Care (Signed)
  Problem: Health Behavior/Discharge Planning: Goal: Ability to manage health-related needs will improve Outcome: Adequate for Discharge   Problem: Clinical Measurements: Goal: Ability to maintain clinical measurements within normal limits will improve Outcome: Adequate for Discharge Goal: Will remain free from infection Outcome: Adequate for Discharge Goal: Diagnostic test results will improve Outcome: Adequate for Discharge Goal: Respiratory complications will improve Outcome: Adequate for Discharge Goal: Cardiovascular complication will be avoided Outcome: Adequate for Discharge   Problem: Activity: Goal: Risk for activity intolerance will decrease Outcome: Adequate for Discharge   Problem: Nutrition: Goal: Adequate nutrition will be maintained Outcome: Adequate for Discharge   Problem: Coping: Goal: Level of anxiety will decrease Outcome: Adequate for Discharge   Problem: Elimination: Goal: Will not experience complications related to bowel motility Outcome: Adequate for Discharge Goal: Will not experience complications related to urinary retention Outcome: Adequate for Discharge   Problem: Pain Managment: Goal: General experience of comfort will improve and/or be controlled Outcome: Adequate for Discharge   Problem: Safety: Goal: Ability to remain free from injury will improve Outcome: Adequate for Discharge   Problem: Skin Integrity: Goal: Risk for impaired skin integrity will decrease Outcome: Adequate for Discharge   Problem: Education: Goal: Understanding of CV disease, CV risk reduction, and recovery process will improve Outcome: Adequate for Discharge Goal: Individualized Educational Video(s) Outcome: Adequate for Discharge   Problem: Activity: Goal: Ability to return to baseline activity level will improve Outcome: Adequate for Discharge   Problem: Cardiovascular: Goal: Ability to achieve and maintain adequate cardiovascular perfusion will  improve Outcome: Adequate for Discharge Goal: Vascular access site(s) Level 0-1 will be maintained Outcome: Adequate for Discharge   Problem: Health Behavior/Discharge Planning: Goal: Ability to safely manage health-related needs after discharge will improve Outcome: Adequate for Discharge

## 2024-02-28 NOTE — TOC CM/SW Note (Addendum)
    Durable Medical Equipment  (From admission, onward)           Start     Ordered   02/28/24 1230  For home use only DME 4 wheeled rolling walker with seat  Once       Comments: General weakness  Question:  Patient needs a walker to treat with the following condition  Answer:  Syncope   02/28/24 1230           Bariatric rollator

## 2024-02-28 NOTE — Progress Notes (Signed)
 Physical Therapy Treatment Patient Details Name: Breanna Riley MRN: [id number] DOB: [birth year] Today's Date: 02/28/2024   History of Present Illness 63 y.o. female admitted 10/1 with Bilateral Pulmonary Embolism with Cor Pulmonale  - presented with syncope. Bil DVT as well. 10/1 bleeding from femoral access site after sutures removed, required re-suturing and femstop.  PMH: obesity, OSA, HLD, osteoarthritis, and spinal stenosis s/p fusions.    PT Comments  Pt agreeable to session, eager to mobilize and reports she is hopeful to return home later today. Pt was able to make great progress in ambulation distance (completing 180 ft + 115 ft + 65 ft) with 3-5 min seated rest breaks between each bout. Pt with VSS throughout (see values below). Pt reports she will have needed assist at home from family, hopeful to start HHPT after d/c. Educated on safe mobility progression, stair training, and walking program. Recommendations updated based on pt's progress (walking 180 ft with supervision and VSS) and pt desire to return home rather than continue to wait on insurance approval for possible intensive therapies.   VITALS:  - sitting EOB - BP: 117/57 (74); HR: 89bpm - sitting on rollator after 180 ft ambulation - BP: 108/55 (71); HR: 90bpm - sitting on rollator after 115 ft ambulation - BP: 113/57 (72); HR: 95bpm   If plan is discharge home, recommend the following: Assistance with cooking/housework;Assist for transportation;Help with stairs or ramp for entrance;A little help with walking and/or transfers   Can travel by private vehicle        Equipment Recommendations  Rollator (4 wheels) (bariatric width with youth height)    Recommendations for Other Services       Precautions / Restrictions Precautions Precautions: Fall Recall of Precautions/Restrictions: Intact Restrictions Weight Bearing Restrictions Per Provider Order: No Other Position/Activity Restrictions: RLE WBAT in postop shoe      Mobility  Bed Mobility Overal bed mobility: Independent             General bed mobility comments: slightly increased time, no assist    Transfers Overall transfer level: Needs assistance Equipment used: Rollator (4 wheels), Rolling walker (2 wheels) Transfers: Sit to/from Stand Sit to Stand: Supervision           General transfer comment: no assist, VSS    Ambulation/Gait Ambulation/Gait assistance: Supervision Gait Distance (Feet): 180 Feet (+ 156ft + 16ft) Assistive device: Rollator (4 wheels) Gait Pattern/deviations: Step-through pattern, Decreased stride length, Wide base of support Gait velocity: 0.42 m/s Gait velocity interpretation: <1.8 ft/sec, indicate of risk for recurrent falls   General Gait Details: supervision for safety, but BP and SpO2 stable, required 3-5 min seated rest after each bout but VSS throughout.   Stairs Stairs:  (discussed verbally)           Wheelchair Mobility     Tilt Bed    Modified Rankin (Stroke Patients Only)       Balance Overall balance assessment: Mild deficits observed, not formally tested Sitting-balance support: No upper extremity supported, Feet supported Sitting balance-Leahy Scale: Good     Standing balance support: No upper extremity supported, During functional activity Standing balance-Leahy Scale: Fair Standing balance comment: BUE support for endurance with gait, can static stand without UE support                            Communication Communication Communication: No apparent difficulties  Cognition Arousal: Alert Behavior During Therapy: [facility] for tasks assessed/performed  PT - Cognitive impairments: No apparent impairments                       PT - Cognition Comments: WFL for session, good understanding of condition and rehab options, good safety awareness and ability to self-monitor Following commands: Intact      Cueing Cueing Techniques: Verbal cues   Exercises      General Comments General comments (skin integrity, edema, etc.): VSS on RA, MAP >70      Pertinent Vitals/Pain Pain Assessment Pain Assessment: No/denies pain Pain Intervention(s): Limited activity within patient's tolerance, Monitored during session, Repositioned    Home Living                          Prior Function            PT Goals (current goals can now be found in the care plan section) Acute Rehab PT Goals Patient Stated Goal: to go home PT Goal Formulation: With patient Time For Goal Achievement: 03/02/24 Potential to Achieve Goals: Good Progress towards PT goals: Progressing toward goals    Frequency    Min 2X/week      PT Plan      Co-evaluation              AM-PAC PT 6 Clicks Mobility   Outcome Measure  Help needed turning from your back to your side while in a flat bed without using bedrails?: None Help needed moving from lying on your back to sitting on the side of a flat bed without using bedrails?: None Help needed moving to and from a bed to a chair (including a wheelchair)?: A Little Help needed standing up from a chair using your arms (e.g., wheelchair or bedside chair)?: A Little Help needed to walk in hospital room?: A Little Help needed climbing 3-5 steps with a railing? : A Lot 6 Click Score: 19    End of Session Equipment Utilized During Treatment: Gait belt Activity Tolerance: Patient tolerated treatment well Patient left: with call bell/phone within reach;in bed (sitting EOB) Nurse Communication: Mobility status PT Visit Diagnosis: Muscle weakness (generalized) (M62.81)     Time: 9141-9073 PT Time Calculation (min) (ACUTE ONLY): 28 min  Charges:    $Gait Training: 8-22 mins $Therapeutic Exercise: 8-22 mins PT General Charges $$ ACUTE PT VISIT: 1 Visit                     Izetta Call, PT, DPT   Acute Rehabilitation Department Office [phone] Secure Chat Communication  Preferred   Izetta JULIANNA Call 02/28/2024, 9:54 AM

## 2024-03-03 DIAGNOSIS — I10 Essential (primary) hypertension: Secondary | ICD-10-CM | POA: Diagnosis not present

## 2024-03-03 DIAGNOSIS — Z48812 Encounter for surgical aftercare following surgery on the circulatory system: Secondary | ICD-10-CM | POA: Diagnosis not present

## 2024-03-03 DIAGNOSIS — N179 Acute kidney failure, unspecified: Secondary | ICD-10-CM | POA: Diagnosis not present

## 2024-03-03 DIAGNOSIS — F32A Depression, unspecified: Secondary | ICD-10-CM | POA: Diagnosis not present

## 2024-03-03 DIAGNOSIS — E871 Hypo-osmolality and hyponatremia: Secondary | ICD-10-CM | POA: Diagnosis not present

## 2024-03-03 DIAGNOSIS — I2609 Other pulmonary embolism with acute cor pulmonale: Secondary | ICD-10-CM | POA: Diagnosis not present

## 2024-03-03 DIAGNOSIS — M84477D Pathological fracture, right toe(s), subsequent encounter for fracture with routine healing: Secondary | ICD-10-CM | POA: Diagnosis not present

## 2024-03-03 DIAGNOSIS — D509 Iron deficiency anemia, unspecified: Secondary | ICD-10-CM | POA: Diagnosis not present

## 2024-03-03 DIAGNOSIS — I083 Combined rheumatic disorders of mitral, aortic and tricuspid valves: Secondary | ICD-10-CM | POA: Diagnosis not present

## 2024-03-06 ENCOUNTER — Telehealth (HOSPITAL_COMMUNITY): Payer: Self-pay

## 2024-03-06 DIAGNOSIS — I10 Essential (primary) hypertension: Secondary | ICD-10-CM | POA: Diagnosis not present

## 2024-03-06 DIAGNOSIS — E871 Hypo-osmolality and hyponatremia: Secondary | ICD-10-CM | POA: Diagnosis not present

## 2024-03-06 DIAGNOSIS — N179 Acute kidney failure, unspecified: Secondary | ICD-10-CM | POA: Diagnosis not present

## 2024-03-06 DIAGNOSIS — I2609 Other pulmonary embolism with acute cor pulmonale: Secondary | ICD-10-CM | POA: Diagnosis not present

## 2024-03-06 DIAGNOSIS — D509 Iron deficiency anemia, unspecified: Secondary | ICD-10-CM | POA: Diagnosis not present

## 2024-03-06 DIAGNOSIS — I083 Combined rheumatic disorders of mitral, aortic and tricuspid valves: Secondary | ICD-10-CM | POA: Diagnosis not present

## 2024-03-06 DIAGNOSIS — F32A Depression, unspecified: Secondary | ICD-10-CM | POA: Diagnosis not present

## 2024-03-06 DIAGNOSIS — Z48812 Encounter for surgical aftercare following surgery on the circulatory system: Secondary | ICD-10-CM | POA: Diagnosis not present

## 2024-03-06 NOTE — Telephone Encounter (Signed)
 Called to confirm/remind patient of their appointment at the [facility] on 03/07/24 8:15.   Appointment:   [x] Confirmed  [] Left mess   [] No answer/No voice mail  [] VM Full/unable to leave message  [] Phone not in service  Patient reminded to bring all medications and/or complete list.  Confirmed patient has transportation. Gave directions, instructed to utilize valet parking.

## 2024-03-07 ENCOUNTER — Ambulatory Visit (HOSPITAL_COMMUNITY)
Admit: 2024-03-07 | Discharge: 2024-03-07 | Disposition: A | Discharge status: Home / Self Care | Attending: Internal Medicine | Admitting: Internal Medicine

## 2024-03-07 ENCOUNTER — Encounter (HOSPITAL_COMMUNITY)

## 2024-03-07 ENCOUNTER — Encounter (HOSPITAL_COMMUNITY): Payer: Self-pay

## 2024-03-07 ENCOUNTER — Ambulatory Visit (HOSPITAL_COMMUNITY): Payer: Self-pay | Admitting: Internal Medicine

## 2024-03-07 VITALS — BP 128/70 | HR 71 | Ht 59.0 in | Wt 281.4 lb

## 2024-03-07 DIAGNOSIS — E785 Hyperlipidemia, unspecified: Secondary | ICD-10-CM | POA: Diagnosis not present

## 2024-03-07 DIAGNOSIS — M1990 Unspecified osteoarthritis, unspecified site: Secondary | ICD-10-CM | POA: Insufficient documentation

## 2024-03-07 DIAGNOSIS — R55 Syncope and collapse: Secondary | ICD-10-CM | POA: Diagnosis not present

## 2024-03-07 DIAGNOSIS — Z79899 Other long term (current) drug therapy: Secondary | ICD-10-CM | POA: Insufficient documentation

## 2024-03-07 DIAGNOSIS — I959 Hypotension, unspecified: Secondary | ICD-10-CM | POA: Insufficient documentation

## 2024-03-07 DIAGNOSIS — Z86718 Personal history of other venous thrombosis and embolism: Secondary | ICD-10-CM | POA: Insufficient documentation

## 2024-03-07 DIAGNOSIS — Z6841 Body Mass Index (BMI) 40.0 and over, adult: Secondary | ICD-10-CM | POA: Diagnosis not present

## 2024-03-07 DIAGNOSIS — N179 Acute kidney failure, unspecified: Secondary | ICD-10-CM | POA: Diagnosis not present

## 2024-03-07 DIAGNOSIS — L309 Dermatitis, unspecified: Secondary | ICD-10-CM | POA: Diagnosis not present

## 2024-03-07 DIAGNOSIS — I82403 Acute embolism and thrombosis of unspecified deep veins of lower extremity, bilateral: Secondary | ICD-10-CM | POA: Diagnosis not present

## 2024-03-07 DIAGNOSIS — G4733 Obstructive sleep apnea (adult) (pediatric): Secondary | ICD-10-CM | POA: Insufficient documentation

## 2024-03-07 DIAGNOSIS — Z8249 Family history of ischemic heart disease and other diseases of the circulatory system: Secondary | ICD-10-CM | POA: Diagnosis not present

## 2024-03-07 DIAGNOSIS — E872 Acidosis, unspecified: Secondary | ICD-10-CM | POA: Insufficient documentation

## 2024-03-07 DIAGNOSIS — I2699 Other pulmonary embolism without acute cor pulmonale: Secondary | ICD-10-CM | POA: Insufficient documentation

## 2024-03-07 DIAGNOSIS — G479 Sleep disorder, unspecified: Secondary | ICD-10-CM | POA: Diagnosis not present

## 2024-03-07 DIAGNOSIS — E669 Obesity, unspecified: Secondary | ICD-10-CM | POA: Insufficient documentation

## 2024-03-07 DIAGNOSIS — Z742 Need for assistance at home and no other household member able to render care: Secondary | ICD-10-CM | POA: Diagnosis not present

## 2024-03-07 DIAGNOSIS — S92901A Unspecified fracture of right foot, initial encounter for closed fracture: Secondary | ICD-10-CM | POA: Diagnosis not present

## 2024-03-07 LAB — BRAIN NATRIURETIC PEPTIDE: B Natriuretic Peptide: 119.2 pg/mL — ABNORMAL HIGH (ref 0.0–100.0)

## 2024-03-07 LAB — BASIC METABOLIC PANEL WITH GFR
Anion gap: 10 (ref 5–15)
BUN: 9 mg/dL (ref 8–23)
CO2: 26 mmol/L (ref 22–32)
Calcium: 9.6 mg/dL (ref 8.9–10.3)
Chloride: 104 mmol/L (ref 98–111)
Creatinine, Ser: 1.15 mg/dL — ABNORMAL HIGH (ref 0.44–1.00)
GFR, Estimated: 53 mL/min — ABNORMAL LOW (ref >60)
Glucose, Bld: 85 mg/dL (ref 70–99)
Potassium: 3.7 mmol/L (ref 3.5–5.1)
Sodium: 140 mmol/L (ref 135–145)

## 2024-03-07 MED ORDER — MIDODRINE HCL 2.5 MG PO TABS: 2.5000 mg | ORAL_TABLET | Freq: Two times a day (BID) | ORAL | 1 refills | Status: DC

## 2024-03-07 NOTE — Patient Instructions (Signed)
 Medication Changes:  DECREASE MIDODRINE TO 2.5MG  TWICE DAILY   TAKE LASIX (FUROSEMIDE) 40MG  DAILY FOR THE NEXT 2 DAYS   THEN RETURN TO 20MG  OF LASIX DAILY ONLY AS NEEDED FOR SWELLING OR WEIGHT GAIN OF 3 POUNDS OVERNIGHT OR 5 POUNDS IN 1 WEEK   Lab Work:  Labs done today, your results will be available in MyChart, we will contact you for abnormal readings.  Follow-Up in: 4-6 WEEKS AS SCHEDULED HERE IN TOC CLINIC   At the [facility], you and your health needs are our priority. We have a designated team specialized in the treatment of Heart Failure. This Care Team includes your primary Heart Failure Specialized Cardiologist (physician), Advanced Practice Providers (APPs- Physician Assistants and Nurse Practitioners), and Pharmacist who all work together to provide you with the care you need, when you need it.   You may see any of the following providers on your designated Care Team at your next follow up:  Dr. Toribio Fuel Dr. Ezra Shuck Dr. Ria Commander Dr. Odis Brownie Greig Mosses, NP Caffie Shed, [place] [facility] [place], [place] Beckey Coe, NP [place] Lee, NP Tinnie Redman, PharmD   Please be sure to bring in all your medications bottles to every appointment.   Need to Contact Us :  If you have any questions or concerns before your next appointment please send us  a message through [facility] or call our office at [phone].    TO LEAVE A MESSAGE FOR THE NURSE SELECT OPTION 2, PLEASE LEAVE A MESSAGE INCLUDING: YOUR NAME DATE OF BIRTH CALL BACK NUMBER REASON FOR CALL**this is important as we prioritize the call backs  YOU WILL RECEIVE A CALL BACK THE SAME DAY AS LONG AS YOU CALL BEFORE 4:00 PM

## 2024-03-09 DIAGNOSIS — I2609 Other pulmonary embolism with acute cor pulmonale: Secondary | ICD-10-CM | POA: Diagnosis not present

## 2024-03-14 DIAGNOSIS — E871 Hypo-osmolality and hyponatremia: Secondary | ICD-10-CM | POA: Diagnosis not present

## 2024-03-14 DIAGNOSIS — I10 Essential (primary) hypertension: Secondary | ICD-10-CM | POA: Diagnosis not present

## 2024-03-14 DIAGNOSIS — Z48812 Encounter for surgical aftercare following surgery on the circulatory system: Secondary | ICD-10-CM | POA: Diagnosis not present

## 2024-03-14 DIAGNOSIS — I2609 Other pulmonary embolism with acute cor pulmonale: Secondary | ICD-10-CM | POA: Diagnosis not present

## 2024-03-14 DIAGNOSIS — F32A Depression, unspecified: Secondary | ICD-10-CM | POA: Diagnosis not present

## 2024-03-14 DIAGNOSIS — D509 Iron deficiency anemia, unspecified: Secondary | ICD-10-CM | POA: Diagnosis not present

## 2024-03-14 DIAGNOSIS — N179 Acute kidney failure, unspecified: Secondary | ICD-10-CM | POA: Diagnosis not present

## 2024-03-14 DIAGNOSIS — M84477D Pathological fracture, right toe(s), subsequent encounter for fracture with routine healing: Secondary | ICD-10-CM | POA: Diagnosis not present

## 2024-03-14 DIAGNOSIS — I083 Combined rheumatic disorders of mitral, aortic and tricuspid valves: Secondary | ICD-10-CM | POA: Diagnosis not present

## 2024-03-16 DIAGNOSIS — E785 Hyperlipidemia, unspecified: Secondary | ICD-10-CM | POA: Diagnosis not present

## 2024-03-16 DIAGNOSIS — I2609 Other pulmonary embolism with acute cor pulmonale: Secondary | ICD-10-CM | POA: Diagnosis not present

## 2024-03-16 DIAGNOSIS — D509 Iron deficiency anemia, unspecified: Secondary | ICD-10-CM | POA: Diagnosis not present

## 2024-03-16 DIAGNOSIS — Z48812 Encounter for surgical aftercare following surgery on the circulatory system: Secondary | ICD-10-CM | POA: Diagnosis not present

## 2024-03-16 DIAGNOSIS — N179 Acute kidney failure, unspecified: Secondary | ICD-10-CM | POA: Diagnosis not present

## 2024-03-16 DIAGNOSIS — M84477D Pathological fracture, right toe(s), subsequent encounter for fracture with routine healing: Secondary | ICD-10-CM | POA: Diagnosis not present

## 2024-03-16 DIAGNOSIS — I083 Combined rheumatic disorders of mitral, aortic and tricuspid valves: Secondary | ICD-10-CM | POA: Diagnosis not present

## 2024-03-16 DIAGNOSIS — F32A Depression, unspecified: Secondary | ICD-10-CM | POA: Diagnosis not present

## 2024-03-16 DIAGNOSIS — J189 Pneumonia, unspecified organism: Secondary | ICD-10-CM | POA: Diagnosis not present

## 2024-03-16 DIAGNOSIS — F339 Major depressive disorder, recurrent, unspecified: Secondary | ICD-10-CM | POA: Diagnosis not present

## 2024-03-16 DIAGNOSIS — E871 Hypo-osmolality and hyponatremia: Secondary | ICD-10-CM | POA: Diagnosis not present

## 2024-03-16 DIAGNOSIS — I10 Essential (primary) hypertension: Secondary | ICD-10-CM | POA: Diagnosis not present

## 2024-03-19 DIAGNOSIS — F4321 Adjustment disorder with depressed mood: Secondary | ICD-10-CM | POA: Diagnosis not present

## 2024-03-20 DIAGNOSIS — I2609 Other pulmonary embolism with acute cor pulmonale: Secondary | ICD-10-CM | POA: Diagnosis not present

## 2024-03-22 DIAGNOSIS — I083 Combined rheumatic disorders of mitral, aortic and tricuspid valves: Secondary | ICD-10-CM | POA: Diagnosis not present

## 2024-03-22 DIAGNOSIS — M84477D Pathological fracture, right toe(s), subsequent encounter for fracture with routine healing: Secondary | ICD-10-CM | POA: Diagnosis not present

## 2024-03-22 DIAGNOSIS — I2609 Other pulmonary embolism with acute cor pulmonale: Secondary | ICD-10-CM | POA: Diagnosis not present

## 2024-03-22 DIAGNOSIS — F32A Depression, unspecified: Secondary | ICD-10-CM | POA: Diagnosis not present

## 2024-03-22 DIAGNOSIS — Z48812 Encounter for surgical aftercare following surgery on the circulatory system: Secondary | ICD-10-CM | POA: Diagnosis not present

## 2024-03-22 DIAGNOSIS — E871 Hypo-osmolality and hyponatremia: Secondary | ICD-10-CM | POA: Diagnosis not present

## 2024-03-22 DIAGNOSIS — D509 Iron deficiency anemia, unspecified: Secondary | ICD-10-CM | POA: Diagnosis not present

## 2024-03-22 DIAGNOSIS — I10 Essential (primary) hypertension: Secondary | ICD-10-CM | POA: Diagnosis not present

## 2024-03-22 DIAGNOSIS — N179 Acute kidney failure, unspecified: Secondary | ICD-10-CM | POA: Diagnosis not present

## 2024-03-23 ENCOUNTER — Telehealth (HOSPITAL_COMMUNITY): Payer: Self-pay | Admitting: Cardiology

## 2024-03-23 NOTE — Telephone Encounter (Signed)
 She does not need midodrine with that blood pressure. Can stop. Continue to monitor blood pressure at home.

## 2024-03-23 NOTE — Telephone Encounter (Signed)
 Patient called to report elevated b/p readings Reports she is also taking midodrine 2.5 mg BID Pt is concerned with new HF/cardiac concerns and all she went through in the hospital  Wanted to make sure she should continue all current medications   11/3 154/90 HR 84 11/4 143/88 HR 94 11/5 149/97 HR 88 11/6 197/98 HR 82 11/7 158/89 HR 85   Reports readings are all before morning dose of medications  Recheck later in the day will be lower however she does not keep record of recheck values  Second dose of midodrine is used at dinner  Pt is asymptomatic   Please advise

## 2024-03-24 ENCOUNTER — Other Ambulatory Visit (HOSPITAL_COMMUNITY): Payer: Self-pay

## 2024-03-25 ENCOUNTER — Other Ambulatory Visit (HOSPITAL_COMMUNITY): Payer: Self-pay

## 2024-03-26 DIAGNOSIS — F4321 Adjustment disorder with depressed mood: Secondary | ICD-10-CM | POA: Diagnosis not present

## 2024-03-26 NOTE — Telephone Encounter (Signed)
 Pt aware.

## 2024-03-27 DIAGNOSIS — G8929 Other chronic pain: Secondary | ICD-10-CM | POA: Diagnosis not present

## 2024-03-27 DIAGNOSIS — E785 Hyperlipidemia, unspecified: Secondary | ICD-10-CM | POA: Diagnosis not present

## 2024-03-27 DIAGNOSIS — M545 Low back pain, unspecified: Secondary | ICD-10-CM | POA: Diagnosis not present

## 2024-03-27 DIAGNOSIS — F339 Major depressive disorder, recurrent, unspecified: Secondary | ICD-10-CM | POA: Diagnosis not present

## 2024-03-27 DIAGNOSIS — G4733 Obstructive sleep apnea (adult) (pediatric): Secondary | ICD-10-CM | POA: Diagnosis not present

## 2024-03-27 DIAGNOSIS — Z86711 Personal history of pulmonary embolism: Secondary | ICD-10-CM | POA: Diagnosis not present

## 2024-03-27 DIAGNOSIS — Z6841 Body Mass Index (BMI) 40.0 and over, adult: Secondary | ICD-10-CM | POA: Diagnosis not present

## 2024-03-27 DIAGNOSIS — Z23 Encounter for immunization: Secondary | ICD-10-CM | POA: Diagnosis not present

## 2024-03-27 DIAGNOSIS — K219 Gastro-esophageal reflux disease without esophagitis: Secondary | ICD-10-CM | POA: Diagnosis not present

## 2024-03-28 DIAGNOSIS — F32A Depression, unspecified: Secondary | ICD-10-CM | POA: Diagnosis not present

## 2024-03-28 DIAGNOSIS — E871 Hypo-osmolality and hyponatremia: Secondary | ICD-10-CM | POA: Diagnosis not present

## 2024-03-28 DIAGNOSIS — I2609 Other pulmonary embolism with acute cor pulmonale: Secondary | ICD-10-CM | POA: Diagnosis not present

## 2024-03-28 DIAGNOSIS — D509 Iron deficiency anemia, unspecified: Secondary | ICD-10-CM | POA: Diagnosis not present

## 2024-03-28 DIAGNOSIS — I10 Essential (primary) hypertension: Secondary | ICD-10-CM | POA: Diagnosis not present

## 2024-03-28 DIAGNOSIS — M84477D Pathological fracture, right toe(s), subsequent encounter for fracture with routine healing: Secondary | ICD-10-CM | POA: Diagnosis not present

## 2024-03-28 DIAGNOSIS — N179 Acute kidney failure, unspecified: Secondary | ICD-10-CM | POA: Diagnosis not present

## 2024-03-28 DIAGNOSIS — Z48812 Encounter for surgical aftercare following surgery on the circulatory system: Secondary | ICD-10-CM | POA: Diagnosis not present

## 2024-04-02 DIAGNOSIS — F4321 Adjustment disorder with depressed mood: Secondary | ICD-10-CM | POA: Diagnosis not present

## 2024-04-05 DIAGNOSIS — D509 Iron deficiency anemia, unspecified: Secondary | ICD-10-CM | POA: Diagnosis not present

## 2024-04-05 DIAGNOSIS — M84477D Pathological fracture, right toe(s), subsequent encounter for fracture with routine healing: Secondary | ICD-10-CM | POA: Diagnosis not present

## 2024-04-05 DIAGNOSIS — F32A Depression, unspecified: Secondary | ICD-10-CM | POA: Diagnosis not present

## 2024-04-05 DIAGNOSIS — I2609 Other pulmonary embolism with acute cor pulmonale: Secondary | ICD-10-CM | POA: Diagnosis not present

## 2024-04-05 DIAGNOSIS — I083 Combined rheumatic disorders of mitral, aortic and tricuspid valves: Secondary | ICD-10-CM | POA: Diagnosis not present

## 2024-04-05 DIAGNOSIS — E871 Hypo-osmolality and hyponatremia: Secondary | ICD-10-CM | POA: Diagnosis not present

## 2024-04-05 DIAGNOSIS — I10 Essential (primary) hypertension: Secondary | ICD-10-CM | POA: Diagnosis not present

## 2024-04-05 DIAGNOSIS — Z48812 Encounter for surgical aftercare following surgery on the circulatory system: Secondary | ICD-10-CM | POA: Diagnosis not present

## 2024-04-05 DIAGNOSIS — N179 Acute kidney failure, unspecified: Secondary | ICD-10-CM | POA: Diagnosis not present

## 2024-04-09 DIAGNOSIS — F4321 Adjustment disorder with depressed mood: Secondary | ICD-10-CM | POA: Diagnosis not present

## 2024-04-10 ENCOUNTER — Telehealth (HOSPITAL_COMMUNITY): Payer: Self-pay

## 2024-04-10 NOTE — Progress Notes (Signed)
 HEART IMPACT TRANSITIONS OF CARE    PCP: Seabron Lenis, MD  Primary Cardiologist: None   Chief Complaint:  Bilateral Pulmonary Embolism with Cor Pulmonale   HPI: Breanna Riley is a 63 y.o. female with history of obesity, OSA, HLD, osteoarthritis, and spinal stenosis s/p fusions.    No prior cardiac history. Father died of MI in his 35s.    She presented to [facility] 9/30 after three syncopal events, two with family and 1 with EMS. She was hypotensive and was given fluids and started on Epi prior to arrival. She had been having lightheadedness for 2 day prior, otherwise has been of normal state of health. On arrival to ED code sepsis was called, given 5L IVF. Cardiology was consult for elevated troponins. She developed orthostasis with another syncopal event and was briefly unresponsive with low BP and given another 1L IVF. She was transferred to [facility] for hypotension and levophed  gtt started. She underwent CTPE which was positive for extensive bilateral PE with RV strain. She was urgently taken for thrombectomy and started on heparin . Echo 10/1: EF 50-55% with RV strain and severely reduced RV, RVSP 42. Venous dopplers w/ b/l lower extremity DVT. Repeat echo 10/3 with EF 55%. NE weaned off. Stable at discharge.   Weaned off midodrine  since last seen in [facility] clinic.   Today she returns for [facility] clinic visit. Overall feeling ***. Denies palpitations, CP, dizziness, edema, or PND/Orthopnea. *** SOB. Appetite ok. No fever or chills. Weight at home *** pounds. Taking all medications. Denies ETOH, tobacco or drug use.     ROS: All systems negative except as listed in HPI, PMH and Problem List.  SH:  Social History   Socioeconomic History   Marital status: Single    Spouse name: Not on file   Number of children: Not on file   Years of education: Not on file   Highest education level: Not on file  Occupational History   Not on file  Tobacco Use   Smoking status: Never   Smokeless  tobacco: Never  Substance and Sexual Activity   Alcohol use: Not Currently   Drug use: Never   Sexual activity: Not on file  Other Topics Concern   Not on file  Social History Narrative   Not on file   Social Drivers of Health   Financial Resource Strain: Low Risk  (09/19/2023)   Received from [facility]   Overall Financial Resource Strain (CARDIA)    Difficulty of Paying Living Expenses: Not hard at all  Food Insecurity: No Food Insecurity (02/14/2024)   Hunger Vital Sign    Worried About Running Out of Food in the Last Year: Never true    Ran Out of Food in the Last Year: Never true  Transportation Needs: No Transportation Needs (02/14/2024)   PRAPARE - Administrator, Civil Service (Medical): No    Lack of Transportation (Non-Medical): No  Physical Activity: Not on file  Stress: No Stress Concern Present (12/12/2023)   Received from [facility] Occupational Health - Occupational Stress Questionnaire    Do you feel stress - tense, restless, nervous, or anxious, or unable to sleep at night because your mind is troubled all the time - these days?: Only a little  Social Connections: Moderately Integrated (02/14/2024)   Social Connection and Isolation Panel    Frequency of Communication with Friends and Family: More than three times a week    Frequency  of Social Gatherings with Friends and Family: More than three times a week    Attends Religious Services: More than 4 times per year    Active Member of [employer] or Organizations: Yes    Attends Engineer, Structural: More than 4 times per year    Marital Status: Never married  Intimate Partner Violence: Not At Risk (02/14/2024)   Humiliation, Afraid, Rape, and Kick questionnaire    Fear of Current or Ex-Partner: No    Emotionally Abused: No    Physically Abused: No    Sexually Abused: No    FH: No family history on file.  Past Medical History:  Diagnosis Date   Hyperlipidemia    Knee  pain    Overweight    Reflux     Current Outpatient Medications  Medication Sig Dispense Refill   apixaban  (ELIQUIS ) 5 MG TABS tablet Take 1 tablet (5 mg total) by mouth 2 (two) times daily. 60 tablet 1   ascorbic acid (VITAMIN C) 100 MG tablet Take 1 tablet by mouth daily. (Patient not taking: Reported on 03/07/2024)     atorvastatin  (LIPITOR) 20 MG tablet Take 20 mg by mouth daily.     cholecalciferol  (VITAMIN D ) 1000 UNITS tablet Take 1,000 Units by mouth 2 (two) times daily. (Patient taking differently: Take 2,000 Units by mouth daily.)     cyanocobalamin 1000 MCG tablet Take by mouth. (Patient not taking: Reported on 03/07/2024)     Cyclobenzaprine  HCl (FLEXERIL  PO) Take 10 mg by mouth daily as needed (muscle spasms).     esomeprazole (NEXIUM) 40 MG capsule Take 40 mg by mouth daily.     ferrous gluconate  (FERGON) 324 MG tablet Take by mouth.     fluticasone (FLONASE) 50 MCG/ACT nasal spray Place 1 spray into both nostrils daily. (Patient taking differently: Place 1 spray into both nostrils as needed for allergies.)     furosemide  (LASIX ) 20 MG tablet Take 1 tablet (20 mg total) by mouth as needed (Increased swelling, weight gain of 3 LB in 1 day or 5 LB in 1 week). 30 tablet 0   polyethylene glycol (MIRALAX  / GLYCOLAX ) 17 g packet Take 17 g by mouth daily as needed for mild constipation.     pregabalin  (LYRICA ) 150 MG capsule Take 200 mg by mouth in the morning, at noon, and at bedtime.     sertraline  (ZOLOFT ) 50 MG tablet Take 50 mg by mouth daily.     traMADol (ULTRAM) 50 MG tablet Take 50 mg by mouth every 12 (twelve) hours as needed for moderate pain (pain score 4-6) or severe pain (pain score 7-10).     No current facility-administered medications for this visit.    There were no vitals filed for this visit.  PHYSICAL EXAM: General:  *** appearing.  No respiratory difficulty Neck: JVD *** cm.  Cor: Regular rate & rhythm. No murmurs. Lungs: clear Extremities: no edema   Neuro: alert & oriented x 3. Affect pleasant.   ECG: none today  ASSESSMENT & PLAN: Recent Bilateral Pulmonary Embolism with Cor Pulmonale - presented with syncope and lactic acidosis  - CTPE + for PE with significant RV strain s/p thrombectomy  - echo 10/1: EF 50-55% with RV strain and severely reduced RV, RVSP 42 - Echo 10/3 EF 55% RV mildly HK  - Echo 02/23/24: EF 50-55%, no RWMA - Venous dopplers w/ b/l lower extremity DVT - Continue apixaban  5 mg BID - RV failure improving.  - Now on  lasix  PRN. Asked her to take 40 mg x2 days then back to 20 mg daily PRN.  - I asked her to keep a log of her BP and weights for next visit.  - BMET/BNP today.    Hypotension - SBP *** at home - Now weaned off midodrine    HLD - LDL 5/25 84. Continue atorva 20 mg daily   OSA - Continue home CPAP   Obesity - There is no height or weight on file to calculate BMI.  - Previously PCP had been working on GLP1, now concerned with increased blood clot risks associated with GLP-1s. Holding off at this time.  - [facility]   Referred to HFSW (PCP, Medications, Transportation, ETOH Abuse, Drug Abuse, Insurance, Financial ): No Refer to Pharmacy: No Refer to Home Health:  No Refer to [facility]:No  Refer to General Cardiology: Yes    Follow up with [facility]

## 2024-04-10 NOTE — Telephone Encounter (Signed)
 Called to confirm/remind patient of their appointment at the [facility] on 04/11/24 11:15.   Appointment:   [x] Confirmed  [] Left mess   [] No answer/No voice mail  [] VM Full/unable to leave message  [] Phone not in service  Patient reminded to bring all medications and/or complete list.  Confirmed patient has transportation. Gave directions, instructed to utilize valet parking.

## 2024-04-11 ENCOUNTER — Encounter (HOSPITAL_COMMUNITY): Payer: Self-pay

## 2024-04-11 ENCOUNTER — Other Ambulatory Visit (HOSPITAL_COMMUNITY): Payer: Self-pay | Admitting: Cardiology

## 2024-04-11 ENCOUNTER — Ambulatory Visit (HOSPITAL_COMMUNITY)
Admission: RE | Admit: 2024-04-11 | Discharge: 2024-04-11 | Disposition: A | Discharge status: Home / Self Care | Source: Ambulatory Visit | Attending: Internal Medicine | Admitting: Internal Medicine

## 2024-04-11 VITALS — BP 134/86 | HR 76 | Wt 283.2 lb

## 2024-04-11 DIAGNOSIS — I110 Hypertensive heart disease with heart failure: Secondary | ICD-10-CM | POA: Insufficient documentation

## 2024-04-11 DIAGNOSIS — Z86718 Personal history of other venous thrombosis and embolism: Secondary | ICD-10-CM | POA: Insufficient documentation

## 2024-04-11 DIAGNOSIS — R600 Localized edema: Secondary | ICD-10-CM | POA: Insufficient documentation

## 2024-04-11 DIAGNOSIS — E785 Hyperlipidemia, unspecified: Secondary | ICD-10-CM | POA: Diagnosis not present

## 2024-04-11 DIAGNOSIS — Z6841 Body Mass Index (BMI) 40.0 and over, adult: Secondary | ICD-10-CM | POA: Insufficient documentation

## 2024-04-11 DIAGNOSIS — I2699 Other pulmonary embolism without acute cor pulmonale: Secondary | ICD-10-CM | POA: Diagnosis not present

## 2024-04-11 DIAGNOSIS — Z8249 Family history of ischemic heart disease and other diseases of the circulatory system: Secondary | ICD-10-CM | POA: Insufficient documentation

## 2024-04-11 DIAGNOSIS — Z7901 Long term (current) use of anticoagulants: Secondary | ICD-10-CM | POA: Insufficient documentation

## 2024-04-11 DIAGNOSIS — E669 Obesity, unspecified: Secondary | ICD-10-CM | POA: Diagnosis not present

## 2024-04-11 DIAGNOSIS — Z79899 Other long term (current) drug therapy: Secondary | ICD-10-CM | POA: Diagnosis not present

## 2024-04-11 DIAGNOSIS — I10 Essential (primary) hypertension: Secondary | ICD-10-CM | POA: Diagnosis not present

## 2024-04-11 DIAGNOSIS — I50810 Right heart failure, unspecified: Secondary | ICD-10-CM | POA: Insufficient documentation

## 2024-04-11 DIAGNOSIS — G4733 Obstructive sleep apnea (adult) (pediatric): Secondary | ICD-10-CM | POA: Diagnosis not present

## 2024-04-11 DIAGNOSIS — Z86711 Personal history of pulmonary embolism: Secondary | ICD-10-CM | POA: Diagnosis not present

## 2024-04-11 DIAGNOSIS — I959 Hypotension, unspecified: Secondary | ICD-10-CM | POA: Insufficient documentation

## 2024-04-11 MED ORDER — AMLODIPINE BESYLATE 2.5 MG PO TABS: 2.5000 mg | ORAL_TABLET | Freq: Every day | ORAL | 3 refills | Status: AC

## 2024-04-11 MED ORDER — FUROSEMIDE 20 MG PO TABS: 20.0000 mg | ORAL_TABLET | ORAL | 3 refills | Status: AC | PRN

## 2024-04-11 MED ORDER — ATORVASTATIN CALCIUM 40 MG PO TABS: 40.0000 mg | ORAL_TABLET | Freq: Every day | ORAL | 3 refills | Status: AC

## 2024-04-11 NOTE — Patient Instructions (Signed)
 Start amlodipine  2.5 mg daily - Rx sent. Increase atorvastatin  to 40 mg daily - updated Rx sent. Referral sent to Cardiology - they should call you for first appointment. If not, please call their office at number below. You may call us  at [phone] if any questions.

## 2024-04-15 DIAGNOSIS — J189 Pneumonia, unspecified organism: Secondary | ICD-10-CM | POA: Diagnosis not present

## 2024-04-15 DIAGNOSIS — E785 Hyperlipidemia, unspecified: Secondary | ICD-10-CM | POA: Diagnosis not present

## 2024-04-15 DIAGNOSIS — F339 Major depressive disorder, recurrent, unspecified: Secondary | ICD-10-CM | POA: Diagnosis not present

## 2024-04-18 DIAGNOSIS — G4733 Obstructive sleep apnea (adult) (pediatric): Secondary | ICD-10-CM | POA: Diagnosis not present

## 2024-04-19 DIAGNOSIS — M65311 Trigger thumb, right thumb: Secondary | ICD-10-CM | POA: Diagnosis not present

## 2024-04-23 DIAGNOSIS — S39012A Strain of muscle, fascia and tendon of lower back, initial encounter: Secondary | ICD-10-CM | POA: Diagnosis not present

## 2024-04-23 DIAGNOSIS — M47816 Spondylosis without myelopathy or radiculopathy, lumbar region: Secondary | ICD-10-CM | POA: Diagnosis not present

## 2024-05-01 ENCOUNTER — Telehealth: Payer: Self-pay

## 2024-05-01 NOTE — Telephone Encounter (Signed)
 Spoke with patient and confirmed appointment on 12/17

## 2024-05-02 ENCOUNTER — Inpatient Hospital Stay

## 2024-05-02 ENCOUNTER — Inpatient Hospital Stay: Attending: Hematology and Oncology | Admitting: Hematology and Oncology

## 2024-05-02 VITALS — BP 158/77 | HR 83 | Temp 97.7°F | Resp 16 | Ht 59.02 in | Wt 284.2 lb

## 2024-05-02 DIAGNOSIS — Z7901 Long term (current) use of anticoagulants: Secondary | ICD-10-CM | POA: Insufficient documentation

## 2024-05-02 DIAGNOSIS — I2699 Other pulmonary embolism without acute cor pulmonale: Secondary | ICD-10-CM | POA: Insufficient documentation

## 2024-05-02 DIAGNOSIS — R760 Raised antibody titer: Secondary | ICD-10-CM | POA: Insufficient documentation

## 2024-05-02 DIAGNOSIS — D509 Iron deficiency anemia, unspecified: Secondary | ICD-10-CM | POA: Diagnosis not present

## 2024-05-02 DIAGNOSIS — Z79899 Other long term (current) drug therapy: Secondary | ICD-10-CM | POA: Insufficient documentation

## 2024-05-02 DIAGNOSIS — I82411 Acute embolism and thrombosis of right femoral vein: Secondary | ICD-10-CM | POA: Insufficient documentation

## 2024-05-02 LAB — ANTITHROMBIN III: AntiThromb III Func: 99 % (ref 75–120)

## 2024-05-02 LAB — CBC WITH DIFFERENTIAL/PLATELET
Abs Immature Granulocytes: 0.02 K/uL (ref 0.00–0.07)
Basophils Absolute: 0 K/uL (ref 0.0–0.1)
Basophils Relative: 1 %
Eosinophils Absolute: 0.1 K/uL (ref 0.0–0.5)
Eosinophils Relative: 2 %
HCT: 34.4 % — ABNORMAL LOW (ref 36.0–46.0)
Hemoglobin: 11.4 g/dL — ABNORMAL LOW (ref 12.0–15.0)
Immature Granulocytes: 0 %
Lymphocytes Relative: 26 %
Lymphs Abs: 1.7 K/uL (ref 0.7–4.0)
MCH: 28.6 pg (ref 26.0–34.0)
MCHC: 33.1 g/dL (ref 30.0–36.0)
MCV: 86.2 fL (ref 80.0–100.0)
Monocytes Absolute: 0.4 K/uL (ref 0.1–1.0)
Monocytes Relative: 6 %
Neutro Abs: 4.3 K/uL (ref 1.7–7.7)
Neutrophils Relative %: 65 %
Platelets: 266 K/uL (ref 150–400)
RBC: 3.99 MIL/uL (ref 3.87–5.11)
RDW: 13 % (ref 11.5–15.5)
WBC: 6.5 K/uL (ref 4.0–10.5)
nRBC: 0 % (ref 0.0–0.2)

## 2024-05-02 LAB — CMP (CANCER CENTER ONLY)
ALT: 11 U/L (ref 0–44)
AST: 25 U/L (ref 15–41)
Albumin: 4.2 g/dL (ref 3.5–5.0)
Alkaline Phosphatase: 79 U/L (ref 38–126)
Anion gap: 10 (ref 5–15)
BUN: 12 mg/dL (ref 8–23)
CO2: 27 mmol/L (ref 22–32)
Calcium: 10.4 mg/dL — ABNORMAL HIGH (ref 8.9–10.3)
Chloride: 102 mmol/L (ref 98–111)
Creatinine: 0.92 mg/dL (ref 0.44–1.00)
GFR, Estimated: >60 mL/min (ref >60)
Glucose, Bld: 84 mg/dL (ref 70–99)
Potassium: 3.9 mmol/L (ref 3.5–5.1)
Sodium: 139 mmol/L (ref 135–145)
Total Bilirubin: 0.7 mg/dL (ref 0.0–1.2)
Total Protein: 7.6 g/dL (ref 6.5–8.1)

## 2024-05-02 LAB — FERRITIN: Ferritin: 173 ng/mL (ref 11–307)

## 2024-05-02 NOTE — Progress Notes (Unsigned)
 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 Asymptomatic on apixaban , no recurrent events. No hormone therapy or malignancy on imaging. Minor bleeding events noted. Hypercoagulable and malignancy evaluation ongoing. - Ordered blood work for hypercoagulable states and thrombosis causes. - Continue apixaban  5 mg BID for at least six months, reassess then. - Follow-up end of March for anticoagulation duration and results review. - CT abdomen/pelvis late January for occult malignancy, contingent on kidney function and contrast exposure. - Advised fall and trauma precautions on anticoagulation.  Iron  deficiency anemia Longstanding anemia since birth, unclear etiology, possibly chronic and unrelated to thrombosis. On oral iron  supplementation, no prior transfusions or infusions except recent hospitalization. Colonoscopy and mammogram up to date, hemorrhoids and diverticulosis noted. - Ordered CBC and iron  studies to reassess anemia and iron  supplementation response. - Ordered CMP to monitor kidney function, especially with prior contrast exposure and planned imaging. - Continue oral iron  supplementation as prescribed. - Review lab results and communicate findings by phone.   HISTORY OF PRESENTING ILLNESS:  Breanna Riley 63 y.o. female is here because of pulmonary embolism  Discussed the use of AI scribe software for clinical note transcription with the patient, who gave verbal consent to proceed.  History of Present Illness Breanna Riley is a 63 year old female with extensive bilateral pulmonary emboli and right femoral deep vein  thrombosis who presents for hematology evaluation and management.  In September 2025, she developed acute onset dyspnea and syncope, prompting emergency evaluation. She has no prior thromboembolic events, no family history of thrombosis, and no history of hormone therapy. She is currently taking apixaban  5 mg twice daily and reports strict adherence. Since initiation of anticoagulation, she has experienced minor bleeding events, including unilateral epistaxis and easy bruising, but denies current dyspnea, syncope, or other thrombotic symptoms.  She attributes the provocation of her thrombotic event to recent immobility following a left fourth toe fracture in July or August 2025, which required use of a boot and limited her activity. She did not undergo surgery or casting but was less active and unable to swim or walk as usual. She continues to experience swelling and tightness in the affected foot.  She has chronic anemia, reportedly present since birth, and a heart murmur. During hospitalization for pulmonary embolism, she was noted to be anemic and received iron  supplementation. She continues daily oral iron  therapy and has never required blood transfusions or outpatient iron  infusions. She denies current symptoms attributable to anemia.  She is up to date on colonoscopy, which revealed hemorrhoids and diverticulosis, and mammography, which was normal. She has had two uncomplicated pregnancies and no history of spontaneous or therapeutic abortions.  All other systems were reviewed with the patient and are negative.  MEDICAL HISTORY:  Past Medical History:  Diagnosis Date   Hyperlipidemia    Knee pain    Overweight    Reflux     SURGICAL HISTORY: Past Surgical History:  Procedure Laterality Date   IR ANGIOGRAM PULMONARY BILATERAL SELECTIVE  02/15/2024   IR ANGIOGRAM SELECTIVE EACH ADDITIONAL VESSEL  02/15/2024   IR ANGIOGRAM SELECTIVE EACH ADDITIONAL VESSEL  02/15/2024   IR THROMBECT PRIM  MECH ADD (INCLU) MOD SED  02/15/2024  IR THROMBECT PRIM MECH ADD (INCLU) MOD SED  02/15/2024   IR THROMBECT PRIM MECH ADD (INCLU) MOD SED  02/15/2024   IR THROMBECT PRIM MECH INIT (INCLU) MOD SED  02/15/2024   IR US  GUIDE VASC ACCESS RIGHT  02/15/2024    SOCIAL HISTORY: Social History   Socioeconomic History   Marital status: Single    Spouse name: Not on file   Number of children: Not on file   Years of education: Not on file   Highest education level: Not on file  Occupational History   Not on file  Tobacco Use   Smoking status: Never   Smokeless tobacco: Never  Substance and Sexual Activity   Alcohol use: Not Currently   Drug use: Never   Sexual activity: Not on file  Other Topics Concern   Not on file  Social History Narrative   Not on file   Social Drivers of Health   Tobacco Use: Low Risk (04/23/2024)   Received from [facility]   Patient History    Smoking Tobacco Use: Never    Smokeless Tobacco Use: Never    Passive Exposure: Past  Financial Resource Strain: Low Risk (09/19/2023)   Received from [facility]   Overall Financial Resource Strain (CARDIA)    Difficulty of Paying Living Expenses: Not hard at all  Food Insecurity: No Food Insecurity (02/14/2024)   Epic    Worried About Radiation Protection Practitioner of Food in the Last Year: Never true    Ran Out of Food in the Last Year: Never true  Transportation Needs: No Transportation Needs (02/14/2024)   Epic    Lack of Transportation (Medical): No    Lack of Transportation (Non-Medical): No  Physical Activity: Not on file  Stress: No Stress Concern Present (12/12/2023)   Received from [facility] - Occupational Stress Questionnaire    Do you feel stress - tense, restless, nervous, or anxious, or unable to sleep at night because your mind is troubled all the time - these days?: Only a little  Social Connections: Moderately Integrated (02/14/2024)   Social Connection and Isolation  Panel    Frequency of Communication with Friends and Family: More than three times a week    Frequency of Social Gatherings with Friends and Family: More than three times a week    Attends Religious Services: More than 4 times per year    Active Member of [employer] or Organizations: Yes    Attends Banker Meetings: More than 4 times per year    Marital Status: Never married  Intimate Partner Violence: Not At Risk (02/14/2024)   Epic    Fear of Current or Ex-Partner: No    Emotionally Abused: No    Physically Abused: No    Sexually Abused: No  Depression (PHQ2-9): Not on file  Alcohol Screen: Not on file  Housing: Low Risk (02/14/2024)   Epic    Unable to Pay for Housing in the Last Year: No    Number of Times Moved in the Last Year: 0    Homeless in the Last Year: No  Utilities: Not At Risk (02/14/2024)   Epic    Threatened with loss of utilities: No  Health Literacy: Not on file    FAMILY HISTORY: No family history on file.  ALLERGIES:  is allergic to naproxen.  MEDICATIONS:  Current Outpatient Medications  Medication Sig Dispense Refill   amLODipine  (NORVASC ) 2.5 MG tablet Take 1 tablet (  2.5 mg total) by mouth daily. 30 tablet 3   apixaban  (ELIQUIS ) 5 MG TABS tablet Take 1 tablet (5 mg total) by mouth 2 (two) times daily. 60 tablet 1   ascorbic acid (VITAMIN C) 100 MG tablet Take 1 tablet by mouth daily.     atorvastatin  (LIPITOR) 40 MG tablet Take 1 tablet (40 mg total) by mouth daily. 90 tablet 3   cholecalciferol  (VITAMIN D ) 1000 UNITS tablet Take 1,000 Units by mouth 2 (two) times daily.     cyanocobalamin 1000 MCG tablet Take by mouth.     Cyclobenzaprine  HCl (FLEXERIL  PO) Take 10 mg by mouth daily as needed (muscle spasms).     esomeprazole (NEXIUM) 40 MG capsule Take 40 mg by mouth daily.     ferrous gluconate  (FERGON) 324 MG tablet Take by mouth.     fluticasone (FLONASE) 50 MCG/ACT nasal spray Place 1 spray into both nostrils daily.     furosemide  (LASIX )  20 MG tablet Take 1 tablet (20 mg total) by mouth as needed (Increased swelling, weight gain of 3 LB in 1 day or 5 LB in 1 week). 30 tablet 3   polyethylene glycol (MIRALAX  / GLYCOLAX ) 17 g packet Take 17 g by mouth daily as needed for mild constipation.     pregabalin  (LYRICA ) 150 MG capsule Take 200 mg by mouth in the morning, at noon, and at bedtime.     sertraline  (ZOLOFT ) 50 MG tablet Take 50 mg by mouth daily.     traMADol (ULTRAM) 50 MG tablet Take 50 mg by mouth every 12 (twelve) hours as needed for moderate pain (pain score 4-6) or severe pain (pain score 7-10).     No current facility-administered medications for this visit.     PHYSICAL EXAMINATION: ECOG PERFORMANCE STATUS: 0 - Asymptomatic  Vitals:   05/02/24 1524  BP: (!) 158/77  Pulse: 83  Resp: 16  Temp: 97.7 F (36.5 C)  SpO2: 100%   Filed Weights   05/02/24 1524  Weight: 284 lb 3.2 oz (128.9 kg)    GENERAL:alert, no distress and comfortable SKIN: skin color, texture, turgor are normal, no rashes or significant lesions EYES: normal, conjunctiva are pink and non-injected, sclera clear OROPHARYNX:no exudate, no erythema and lips, buccal mucosa, and tongue normal  NECK: supple, thyroid  normal size, non-tender, without nodularity LYMPH:  no palpable lymphadenopathy in the cervical, axillary  LUNGS: clear to auscultation and percussion with normal breathing effort HEART: regular rate & rhythm and no murmurs and no lower extremity edema ABDOMEN:abdomen soft, non-tender and normal bowel sounds Musculoskeletal:no cyanosis of digits and no clubbing  PSYCH: alert & oriented x 3 with fluent speech NEURO: no focal motor/sensory deficits  LABORATORY DATA:  I have reviewed the data as listed Lab Results  Component Value Date   WBC 4.8 02/27/2024   HGB 8.9 (L) 02/27/2024   HCT 27.9 (L) 02/27/2024   MCV 91.2 02/27/2024   PLT 265 02/27/2024     Chemistry      Component Value Date/Time   NA 140 03/07/2024 0926   K  3.7 03/07/2024 0926   CL 104 03/07/2024 0926   CO2 26 03/07/2024 0926   BUN 9 03/07/2024 0926   CREATININE 1.15 (H) 03/07/2024 0926      Component Value Date/Time   CALCIUM  9.6 03/07/2024 0926   ALKPHOS 66 02/15/2024 0110   AST 73 (H) 02/15/2024 0110   ALT 41 02/15/2024 0110   BILITOT 1.1 02/15/2024 0110  RADIOGRAPHIC STUDIES: I have personally reviewed the radiological images as listed and agreed with the findings in the report. No results found.  All questions were answered. The patient knows to call the clinic with any problems, questions or concerns. I spent 45 minutes in the care of this patient including H and P, review of records, counseling and coordination of care.     Amber Stalls, MD 05/02/2024 3:31 PM

## 2024-05-02 NOTE — Progress Notes (Unsigned)
 [facility] CONSULT NOTE  Patient Care Team: Seabron Lenis, MD as PCP - General (Family Medicine)  CHIEF COMPLAINTS/PURPOSE OF CONSULTATION:  ***  ASSESSMENT & PLAN:  No problem-specific Assessment & Plan notes found for this encounter.  No orders of the defined types were placed in this encounter.    HISTORY OF PRESENTING ILLNESS:  Breanna Riley 63 y.o. female is here because of ***  REVIEW OF SYSTEMS:   Constitutional: Denies fevers, chills or abnormal night sweats Eyes: Denies blurriness of vision, double vision or watery eyes Ears, nose, mouth, throat, and face: Denies mucositis or sore throat Respiratory: Denies cough, dyspnea or wheezes Cardiovascular: Denies palpitation, chest discomfort or lower extremity swelling Gastrointestinal:  Denies nausea, heartburn or change in bowel habits Skin: Denies abnormal skin rashes Lymphatics: Denies new lymphadenopathy or easy bruising Neurological:Denies numbness, tingling or new weaknesses Behavioral/Psych: Mood is stable, no new changes  All other systems were reviewed with the patient and are negative.  MEDICAL HISTORY:  Past Medical History:  Diagnosis Date   Hyperlipidemia    Knee pain    Overweight    Reflux     SURGICAL HISTORY: Past Surgical History:  Procedure Laterality Date   IR ANGIOGRAM PULMONARY BILATERAL SELECTIVE  02/15/2024   IR ANGIOGRAM SELECTIVE EACH ADDITIONAL VESSEL  02/15/2024   IR ANGIOGRAM SELECTIVE EACH ADDITIONAL VESSEL  02/15/2024   IR THROMBECT PRIM MECH ADD (INCLU) MOD SED  02/15/2024   IR THROMBECT PRIM MECH ADD (INCLU) MOD SED  02/15/2024   IR THROMBECT PRIM MECH ADD (INCLU) MOD SED  02/15/2024   IR THROMBECT PRIM MECH INIT (INCLU) MOD SED  02/15/2024   IR US  GUIDE VASC ACCESS RIGHT  02/15/2024    SOCIAL HISTORY: Social History   Socioeconomic History   Marital status: Single    Spouse name: Not on file   Number of children: Not on file   Years of education: Not on  file   Highest education level: Not on file  Occupational History   Not on file  Tobacco Use   Smoking status: Never   Smokeless tobacco: Never  Substance and Sexual Activity   Alcohol use: Not Currently   Drug use: Never   Sexual activity: Not on file  Other Topics Concern   Not on file  Social History Narrative   Not on file   Social Drivers of Health   Tobacco Use: Low Risk (04/23/2024)   Received from [facility]   Patient History    Smoking Tobacco Use: Never    Smokeless Tobacco Use: Never    Passive Exposure: Past  Financial Resource Strain: Low Risk (09/19/2023)   Received from [facility]   Overall Financial Resource Strain (CARDIA)    Difficulty of Paying Living Expenses: Not hard at all  Food Insecurity: No Food Insecurity (02/14/2024)   Epic    Worried About Programme Researcher, Broadcasting/film/video in the Last Year: Never true    [employer] of Food in the Last Year: Never true  Transportation Needs: No Transportation Needs (02/14/2024)   Epic    Lack of Transportation (Medical): No    Lack of Transportation (Non-Medical): No  Physical Activity: Not on file  Stress: No Stress Concern Present (12/12/2023)   Received from [facility] - Occupational Stress Questionnaire    Do you feel stress - tense, restless, nervous, or anxious, or unable to sleep at night because your mind is troubled all  the time - these days?: Only a little  Social Connections: Moderately Integrated (02/14/2024)   Social Connection and Isolation Panel    Frequency of Communication with Friends and Family: More than three times a week    Frequency of Social Gatherings with Friends and Family: More than three times a week    Attends Religious Services: More than 4 times per year    Active Member of [employer] or Organizations: Yes    Attends Banker Meetings: More than 4 times per year    Marital Status: Never married  Intimate Partner Violence: Not At Risk  (02/14/2024)   Epic    Fear of Current or Ex-Partner: No    Emotionally Abused: No    Physically Abused: No    Sexually Abused: No  Depression (PHQ2-9): Not on file  Alcohol Screen: Not on file  Housing: Low Risk (02/14/2024)   Epic    Unable to Pay for Housing in the Last Year: No    Number of Times Moved in the Last Year: 0    Homeless in the Last Year: No  Utilities: Not At Risk (02/14/2024)   Epic    Threatened with loss of utilities: No  Health Literacy: Not on file    FAMILY HISTORY: No family history on file.  ALLERGIES:  is allergic to naproxen.  MEDICATIONS:  Current Outpatient Medications  Medication Sig Dispense Refill   amLODipine  (NORVASC ) 2.5 MG tablet Take 1 tablet (2.5 mg total) by mouth daily. 30 tablet 3   apixaban  (ELIQUIS ) 5 MG TABS tablet Take 1 tablet (5 mg total) by mouth 2 (two) times daily. 60 tablet 1   ascorbic acid (VITAMIN C) 100 MG tablet Take 1 tablet by mouth daily.     atorvastatin  (LIPITOR) 40 MG tablet Take 1 tablet (40 mg total) by mouth daily. 90 tablet 3   cholecalciferol  (VITAMIN D ) 1000 UNITS tablet Take 1,000 Units by mouth 2 (two) times daily.     cyanocobalamin 1000 MCG tablet Take by mouth.     Cyclobenzaprine  HCl (FLEXERIL  PO) Take 10 mg by mouth daily as needed (muscle spasms).     esomeprazole (NEXIUM) 40 MG capsule Take 40 mg by mouth daily.     ferrous gluconate  (FERGON) 324 MG tablet Take by mouth.     fluticasone (FLONASE) 50 MCG/ACT nasal spray Place 1 spray into both nostrils daily.     furosemide  (LASIX ) 20 MG tablet Take 1 tablet (20 mg total) by mouth as needed (Increased swelling, weight gain of 3 LB in 1 day or 5 LB in 1 week). 30 tablet 3   polyethylene glycol (MIRALAX  / GLYCOLAX ) 17 g packet Take 17 g by mouth daily as needed for mild constipation.     pregabalin  (LYRICA ) 150 MG capsule Take 200 mg by mouth in the morning, at noon, and at bedtime.     sertraline  (ZOLOFT ) 50 MG tablet Take 50 mg by mouth daily.      traMADol (ULTRAM) 50 MG tablet Take 50 mg by mouth every 12 (twelve) hours as needed for moderate pain (pain score 4-6) or severe pain (pain score 7-10).     No current facility-administered medications for this visit.     PHYSICAL EXAMINATION: ECOG PERFORMANCE STATUS: {CHL ONC ECOG ED:[id number]}  Vitals:   05/02/24 1524  BP: (!) 158/77  Pulse: 83  Resp: 16  Temp: 97.7 F (36.5 C)  SpO2: 100%   Filed Weights   05/02/24 1524  Weight: 284  lb 3.2 oz (128.9 kg)    GENERAL:alert, no distress and comfortable SKIN: skin color, texture, turgor are normal, no rashes or significant lesions EYES: normal, conjunctiva are pink and non-injected, sclera clear OROPHARYNX:no exudate, no erythema and lips, buccal mucosa, and tongue normal  NECK: supple, thyroid  normal size, non-tender, without nodularity LYMPH:  no palpable lymphadenopathy in the cervical, axillary or inguinal LUNGS: clear to auscultation and percussion with normal breathing effort HEART: regular rate & rhythm and no murmurs and no lower extremity edema ABDOMEN:abdomen soft, non-tender and normal bowel sounds Musculoskeletal:no cyanosis of digits and no clubbing  PSYCH: alert & oriented x 3 with fluent speech NEURO: no focal motor/sensory deficits  LABORATORY DATA:  I have reviewed the data as listed Lab Results  Component Value Date   WBC 4.8 02/27/2024   HGB 8.9 (L) 02/27/2024   HCT 27.9 (L) 02/27/2024   MCV 91.2 02/27/2024   PLT 265 02/27/2024     Chemistry      Component Value Date/Time   NA 140 03/07/2024 0926   K 3.7 03/07/2024 0926   CL 104 03/07/2024 0926   CO2 26 03/07/2024 0926   BUN 9 03/07/2024 0926   CREATININE 1.15 (H) 03/07/2024 0926      Component Value Date/Time   CALCIUM  9.6 03/07/2024 0926   ALKPHOS 66 02/15/2024 0110   AST 73 (H) 02/15/2024 0110   ALT 41 02/15/2024 0110   BILITOT 1.1 02/15/2024 0110       RADIOGRAPHIC STUDIES: I have personally reviewed the radiological images as  listed and agreed with the findings in the report. No results found.  All questions were answered. The patient knows to call the clinic with any problems, questions or concerns. I spent *** minutes in the care of this patient including H and P, review of records, counseling and coordination of care.     Amber Stalls, MD 05/02/2024 3:28 PM

## 2024-05-03 LAB — LUPUS ANTICOAGULANT PANEL
DRVVT: 39.4 s (ref 0.0–47.0)
PTT Lupus Anticoagulant: 31.9 s (ref 0.0–43.5)

## 2024-05-03 LAB — PROTEIN S ACTIVITY: Protein S Activity: 76 % (ref 63–140)

## 2024-05-03 LAB — PROTEIN C ACTIVITY: Protein C Activity: 162 % (ref 73–180)

## 2024-05-05 LAB — BETA-2-GLYCOPROTEIN I ABS, IGG/M/A
Beta-2 Glyco I IgG: <9 GPI IgG units (ref 0–20)
Beta-2-Glycoprotein I IgA: <9 GPI IgA units (ref 0–25)
Beta-2-Glycoprotein I IgM: <9 GPI IgM units (ref 0–32)

## 2024-05-07 LAB — CARDIOLIPIN ANTIBODIES, IGG, IGM, IGA
Anticardiolipin IgA: <9 U/mL (ref 0–11)
Anticardiolipin IgG: <9 GPL U/mL (ref 0–14)
Anticardiolipin IgM: 20 [MPL'U]/mL — ABNORMAL HIGH (ref 0–12)

## 2024-05-07 LAB — FACTOR 5 LEIDEN

## 2024-05-07 LAB — PROTHROMBIN GENE MUTATION

## 2024-05-15 ENCOUNTER — Inpatient Hospital Stay: Admitting: Hematology and Oncology

## 2024-05-15 DIAGNOSIS — I82411 Acute embolism and thrombosis of right femoral vein: Secondary | ICD-10-CM | POA: Diagnosis not present

## 2024-05-15 DIAGNOSIS — D509 Iron deficiency anemia, unspecified: Secondary | ICD-10-CM | POA: Diagnosis not present

## 2024-05-15 DIAGNOSIS — I2699 Other pulmonary embolism without acute cor pulmonale: Secondary | ICD-10-CM | POA: Diagnosis not present

## 2024-05-15 LAB — HEXAGONAL PHOSPHOLIPID NEUTRALIZATION: Hexagonal Phospholipid Neutral: 1 s

## 2024-05-15 NOTE — Progress Notes (Signed)
 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  Hypercoag work up with no obvious findings, mildly elevated anticardiolipin Ig M antibodies which need to be repeated. Plan is to continue anticoagulation for 6 months, repeat anti cardiolipin abx and may be JAK 2 testing and PNH profile as well. She will also do a CT abd / pelvis to look for occult malignancy given the extent of clot  Iron  deficiency anemia Longstanding anemia since birth, unclear etiology, possibly chronic and unrelated to thrombosis. On oral iron  supplementation, no prior transfusions or infusions except recent hospitalization. Hb improved significantly She wants to continue oral iron  a couple times a week, she says every time she comes off of it she becomes anemic.  She should also continue age appropriate cancer screening   HISTORY OF PRESENTING ILLNESS:  Breanna Riley 63 y.o. female is here because of pulmonary embolism  Discussed the use of AI scribe software for clinical note transcription with the patient, who gave verbal consent to proceed.  History of Present Illness Breanna Riley is a 63 year old female with extensive bilateral pulmonary emboli and right femoral deep vein thrombosis who presents for hematology evaluation and management.  In September 2025, she developed acute onset dyspnea and syncope, prompting emergency evaluation. She has no prior thromboembolic events, no family history of thrombosis, and no history of hormone therapy. She is currently taking apixaban  5 mg twice daily and reports strict adherence. Since initiation of anticoagulation,  she has experienced minor bleeding events, including unilateral epistaxis and easy bruising, but denies current dyspnea, syncope, or other thrombotic symptoms.  She attributes the provocation of her thrombotic event to recent immobility following a left fourth toe fracture in July or August 2025, which required use of a boot and limited her activity. She did not undergo surgery or casting but was less active and unable to swim or walk as usual. She continues to experience swelling and tightness in the affected foot.  She has chronic anemia, reportedly present since birth, and a heart murmur. During hospitalization for pulmonary embolism, she was noted to be anemic and received iron  supplementation. She continues daily oral iron  therapy and has never required blood transfusions or outpatient iron  infusions. She denies current symptoms attributable to anemia.  She is up to date on colonoscopy, which revealed hemorrhoids and diverticulosis, and mammography, which was normal. She has had two uncomplicated pregnancies and no history of spontaneous or therapeutic abortions.  Today was a follow up telephone visit to review blood work results.  All other systems were reviewed with the patient and are negative.  MEDICAL HISTORY:  Past Medical History:  Diagnosis Date   Hyperlipidemia    Knee pain    Overweight    Reflux     SURGICAL HISTORY: Past Surgical History:  Procedure Laterality Date   IR ANGIOGRAM PULMONARY BILATERAL SELECTIVE  02/15/2024   IR ANGIOGRAM SELECTIVE EACH ADDITIONAL VESSEL  02/15/2024   IR ANGIOGRAM SELECTIVE EACH ADDITIONAL VESSEL  02/15/2024   IR THROMBECT PRIM MECH ADD (INCLU) MOD SED  02/15/2024   IR THROMBECT PRIM MECH ADD (INCLU) MOD SED  02/15/2024   IR THROMBECT PRIM MECH ADD (INCLU) MOD SED  02/15/2024  IR THROMBECT PRIM MECH INIT (INCLU) MOD SED  02/15/2024   IR US  GUIDE VASC ACCESS RIGHT  02/15/2024    SOCIAL HISTORY: Social History   Socioeconomic History    Marital status: Single    Spouse name: Not on file   Number of children: Not on file   Years of education: Not on file   Highest education level: Not on file  Occupational History   Not on file  Tobacco Use   Smoking status: Never   Smokeless tobacco: Never  Substance and Sexual Activity   Alcohol use: Not Currently   Drug use: Never   Sexual activity: Not on file  Other Topics Concern   Not on file  Social History Narrative   Not on file   Social Drivers of Health   Tobacco Use: Low Risk (04/23/2024)   Received from [facility]   Patient History    Smoking Tobacco Use: Never    Smokeless Tobacco Use: Never    Passive Exposure: Past  Financial Resource Strain: Low Risk (09/19/2023)   Received from [facility]   Overall Financial Resource Strain (CARDIA)    Difficulty of Paying Living Expenses: Not hard at all  Food Insecurity: No Food Insecurity (02/14/2024)   Epic    Worried About Radiation Protection Practitioner of Food in the Last Year: Never true    Ran Out of Food in the Last Year: Never true  Transportation Needs: No Transportation Needs (02/14/2024)   Epic    Lack of Transportation (Medical): No    Lack of Transportation (Non-Medical): No  Physical Activity: Not on file  Stress: No Stress Concern Present (12/12/2023)   Received from [facility] - Occupational Stress Questionnaire    Do you feel stress - tense, restless, nervous, or anxious, or unable to sleep at night because your mind is troubled all the time - these days?: Only a little  Social Connections: Moderately Integrated (02/14/2024)   Social Connection and Isolation Panel    Frequency of Communication with Friends and Family: More than three times a week    Frequency of Social Gatherings with Friends and Family: More than three times a week    Attends Religious Services: More than 4 times per year    Active Member of [employer] or Organizations: Yes    Attends Banker  Meetings: More than 4 times per year    Marital Status: Never married  Intimate Partner Violence: Not At Risk (02/14/2024)   Epic    Fear of Current or Ex-Partner: No    Emotionally Abused: No    Physically Abused: No    Sexually Abused: No  Depression (PHQ2-9): Not on file  Alcohol Screen: Not on file  Housing: Low Risk (02/14/2024)   Epic    Unable to Pay for Housing in the Last Year: No    Number of Times Moved in the Last Year: 0    Homeless in the Last Year: No  Utilities: Not At Risk (02/14/2024)   Epic    Threatened with loss of utilities: No  Health Literacy: Not on file    FAMILY HISTORY: No family history on file.  ALLERGIES:  is allergic to naproxen.  MEDICATIONS:  Current Outpatient Medications  Medication Sig Dispense Refill   amLODipine  (NORVASC ) 2.5 MG tablet Take 1 tablet (2.5 mg total) by mouth daily. 30 tablet 3   apixaban  (ELIQUIS ) 5 MG TABS tablet Take 1 tablet (5 mg total)  by mouth 2 (two) times daily. 60 tablet 1   ascorbic acid (VITAMIN C) 100 MG tablet Take 1 tablet by mouth daily.     atorvastatin  (LIPITOR) 40 MG tablet Take 1 tablet (40 mg total) by mouth daily. 90 tablet 3   cholecalciferol  (VITAMIN D ) 1000 UNITS tablet Take 1,000 Units by mouth 2 (two) times daily.     cyanocobalamin 1000 MCG tablet Take by mouth.     Cyclobenzaprine  HCl (FLEXERIL  PO) Take 10 mg by mouth daily as needed (muscle spasms).     esomeprazole (NEXIUM) 40 MG capsule Take 40 mg by mouth daily.     ferrous gluconate  (FERGON) 324 MG tablet Take by mouth.     fluticasone (FLONASE) 50 MCG/ACT nasal spray Place 1 spray into both nostrils daily.     furosemide  (LASIX ) 20 MG tablet Take 1 tablet (20 mg total) by mouth as needed (Increased swelling, weight gain of 3 LB in 1 day or 5 LB in 1 week). 30 tablet 3   polyethylene glycol (MIRALAX  / GLYCOLAX ) 17 g packet Take 17 g by mouth daily as needed for mild constipation.     pregabalin  (LYRICA ) 150 MG capsule Take 200 mg by mouth in  the morning, at noon, and at bedtime.     sertraline  (ZOLOFT ) 50 MG tablet Take 50 mg by mouth daily.     traMADol (ULTRAM) 50 MG tablet Take 50 mg by mouth every 12 (twelve) hours as needed for moderate pain (pain score 4-6) or severe pain (pain score 7-10).     No current facility-administered medications for this visit.     PHYSICAL EXAMINATION: ECOG PERFORMANCE STATUS: 0 - Asymptomatic  There were no vitals filed for this visit.  There were no vitals filed for this visit.   Telephone visit  LABORATORY DATA:  I have reviewed the data as listed Lab Results  Component Value Date   WBC 6.5 05/02/2024   HGB 11.4 (L) 05/02/2024   HCT 34.4 (L) 05/02/2024   MCV 86.2 05/02/2024   PLT 266 05/02/2024     Chemistry      Component Value Date/Time   NA 139 05/02/2024 1557   K 3.9 05/02/2024 1557   CL 102 05/02/2024 1557   CO2 27 05/02/2024 1557   BUN 12 05/02/2024 1557   CREATININE 0.92 05/02/2024 1557      Component Value Date/Time   CALCIUM  10.4 (H) 05/02/2024 1557   ALKPHOS 79 05/02/2024 1557   AST 25 05/02/2024 1557   ALT 11 05/02/2024 1557   BILITOT 0.7 05/02/2024 1557       RADIOGRAPHIC STUDIES: I have personally reviewed the radiological images as listed and agreed with the findings in the report. No results found.  All questions were answered. The patient knows to call the clinic with any problems, questions or concerns. I spent 15 minutes in the care of this patient including review of records, counseling and coordination of care.  I connected with  Apolinar Trudy Sharps on 05/15/2024 by a telephone application and verified that I am speaking with the correct person using two identifiers.   I discussed the limitations of evaluation and management by telemedicine. The patient expressed understanding and agreed to proceed.  Location of pt: Home Location of provider: office.     Amber Stalls, MD 05/15/2024 11:12 AM

## 2024-05-16 ENCOUNTER — Telehealth: Payer: Self-pay | Admitting: Hematology and Oncology

## 2024-05-16 ENCOUNTER — Encounter: Payer: Self-pay | Admitting: Hematology and Oncology

## 2024-05-16 NOTE — Telephone Encounter (Signed)
" °  I spoke w the pt about schduling lab in march per providers request. Pt is aware of appt date and time.   "

## 2024-06-07 ENCOUNTER — Ambulatory Visit (HOSPITAL_COMMUNITY)
Admission: RE | Admit: 2024-06-07 | Discharge: 2024-06-07 | Disposition: A | Discharge status: Home / Self Care | Source: Ambulatory Visit | Attending: Hematology and Oncology | Admitting: Hematology and Oncology

## 2024-06-07 DIAGNOSIS — K5730 Diverticulosis of large intestine without perforation or abscess without bleeding: Secondary | ICD-10-CM | POA: Insufficient documentation

## 2024-06-07 DIAGNOSIS — K449 Diaphragmatic hernia without obstruction or gangrene: Secondary | ICD-10-CM | POA: Insufficient documentation

## 2024-06-07 DIAGNOSIS — I2699 Other pulmonary embolism without acute cor pulmonale: Secondary | ICD-10-CM | POA: Diagnosis present

## 2024-06-07 MED ORDER — IOHEXOL 300 MG/ML  SOLN
100.0000 mL | Freq: Once | INTRAMUSCULAR | Status: AC | PRN
  Administered 2024-06-07: 100 mL via INTRAVENOUS

## 2024-06-13 ENCOUNTER — Ambulatory Visit: Payer: Self-pay | Admitting: Hematology and Oncology

## 2024-06-15 NOTE — Telephone Encounter (Signed)
 Attempted to call pt to review results. LVM for call back d/t no DPR on file.

## 2024-06-15 NOTE — Telephone Encounter (Signed)
-----   Message from Amber Stalls, MD sent at 06/13/2024  9:09 PM EST ----- Ct scan with no concern for cancer. Please let her know.

## 2024-06-19 ENCOUNTER — Telehealth: Payer: Self-pay

## 2024-06-19 NOTE — Telephone Encounter (Signed)
-----   Message from Amber Stalls, MD sent at 06/13/2024  9:09 PM EST ----- Ct scan with no concern for cancer. Please let her know.

## 2024-06-19 NOTE — Telephone Encounter (Signed)
 Per Iruku MD results of recent CT were negative for concerns of cancer. Pt notified and verbalized understanding. Pt saw that CT mentioned degenerative changes on spine and asked that we fax results to Dr. Royden Schneider at [facility]. RN will fax.

## 2024-06-19 NOTE — Telephone Encounter (Signed)
 RN attempted to call pt x2, LVM asking her to return our call as there is no DPR and Dr. Loretha asked RN to go over her CT results w/ pt.

## 2024-08-01 ENCOUNTER — Inpatient Hospital Stay

## 2024-08-07 ENCOUNTER — Inpatient Hospital Stay: Admitting: Hematology and Oncology
